# Patient Record
Sex: Female | Born: 1995 | Race: Black or African American | Hispanic: No | Marital: Single | Smoking: Former smoker
Health system: Southern US, Community
[De-identification: ages and names within clinical notes are randomized; demographics above are authoritative.]

## PROBLEM LIST (undated history)

## (undated) DIAGNOSIS — A749 Chlamydial infection, unspecified: Secondary | ICD-10-CM

## (undated) DIAGNOSIS — J45909 Unspecified asthma, uncomplicated: Secondary | ICD-10-CM

## (undated) DIAGNOSIS — D649 Anemia, unspecified: Secondary | ICD-10-CM

## (undated) DIAGNOSIS — B999 Unspecified infectious disease: Secondary | ICD-10-CM

## (undated) HISTORY — PX: TONSILLECTOMY: SUR1361

---
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
## 2013-05-19 ENCOUNTER — Emergency Department (HOSPITAL_COMMUNITY)
Admission: EM | Admit: 2013-05-19 | Discharge: 2013-05-20 | Disposition: A | Discharge status: Home / Self Care | Payer: Medicaid Other | Attending: Emergency Medicine | Admitting: Emergency Medicine

## 2013-05-19 ENCOUNTER — Encounter (HOSPITAL_COMMUNITY): Payer: Self-pay

## 2013-05-19 DIAGNOSIS — J45909 Unspecified asthma, uncomplicated: Secondary | ICD-10-CM | POA: Insufficient documentation

## 2013-05-19 DIAGNOSIS — H9209 Otalgia, unspecified ear: Secondary | ICD-10-CM | POA: Insufficient documentation

## 2013-05-19 DIAGNOSIS — K011 Impacted teeth: Secondary | ICD-10-CM

## 2013-05-19 DIAGNOSIS — K006 Disturbances in tooth eruption: Secondary | ICD-10-CM | POA: Insufficient documentation

## 2013-05-19 NOTE — ED Notes (Signed)
Dental pain that started about 1 week ago and is now causing facial pain and throat pain with also ear pain.  Pt is A&O with family at bedside

## 2013-05-19 NOTE — ED Provider Notes (Signed)
CSN: [id number]     Arrival date & time 05/19/13  2222 History  This chart was scribed for Earley Favor, NP working with Laray Anger, DO by Quintella Reichert, ED Scribe. This patient was seen in room TR08C/TR08C and the patient's care was started at 11:47 PM.   Chief Complaint  Patient presents with  . Dental Pain    The history is provided by the patient. No language interpreter was used.    HPI Comments: Stacy Warner is a 17 y.o. female who presents to the Emergency Department complaining of progressively-worsening moderate bilateral lower dental pain that began 2 weeks ago.  Pain radiates to bilateral ears and sides of her face.  Pt states her 2 lower wisdom teeth are coming in and she does not have a dentist.  She has attempted to treat pain with ibuprofen and Orajel, without relief.     Past Medical History  Diagnosis Date  . Asthma     Past Surgical History  Procedure Laterality Date  . Tonsillectomy      No family history on file.   History  Substance Use Topics  . Smoking status: Never Smoker   . Smokeless tobacco: Not on file  . Alcohol Use: No    OB History   Grav Para Term Preterm Abortions TAB SAB Ect Mult Living   0               Review of Systems  HENT: Positive for ear pain and dental problem.   Neurological: Negative for dizziness and headaches.  All other systems reviewed and are negative.     Allergies  Review of patient's allergies indicates no known allergies.  Home Medications   Current Outpatient Rx  Name  Route  Sig  Dispense  Refill  . naproxen sodium (ANAPROX) 220 MG tablet   Oral   Take 440 mg by mouth every 4 (four) hours as needed.         . traMADol (ULTRAM) 50 MG tablet   Oral   Take 1 tablet (50 mg total) by mouth every 6 (six) hours as needed for pain.   15 tablet   0    BP 113/80  Pulse 84  Temp(Src) 97.1 F (36.2 C)  Resp 18  SpO2 100%  LMP 05/17/2013  Physical Exam  Nursing note and vitals  reviewed. Constitutional: She appears well-developed and well-nourished. No distress.  HENT:  Head: Normocephalic and atraumatic.  Right Ear: External ear normal.  Left Ear: External ear normal.  Mouth/Throat: Oropharynx is clear and moist.  Partially erupted lower wisdom teeth  Eyes: Conjunctivae are normal.  Neck: Neck supple. No tracheal deviation present.  Cardiovascular: Normal rate.   Pulmonary/Chest: Effort normal. No respiratory distress.  Musculoskeletal: Normal range of motion.  Lymphadenopathy:    She has no cervical adenopathy.  Neurological: She is alert.  Skin: Skin is warm and dry.  Psychiatric: She has a normal mood and affect. Her behavior is normal.    ED Course  Procedures (including critical care time)  DIAGNOSTIC STUDIES: Oxygen Saturation is 100% on room air, normal by my interpretation.    COORDINATION OF CARE: 11:51 PM-Discussed treatment plan which includes pain medication and dental f/u with pt at bedside and pt agreed to plan.   Labs Review Labs Reviewed - No data to display  Imaging Review No results found.  MDM   1. Impacted third molar tooth   moderate gum swelling    I personally  performed the services described in this documentation, which was scribed in my presence. The recorded information has been reviewed and is accurate.     Arman Filter, NP 05/20/13 [phone]

## 2013-05-20 MED ORDER — TRAMADOL HCL 50 MG PO TABS
50.0000 mg | ORAL_TABLET | Freq: Once | ORAL | Status: AC
  Administered 2013-05-20: 50 mg via ORAL
  Filled 2013-05-20: qty 1

## 2013-05-20 MED ORDER — TRAMADOL HCL 50 MG PO TABS: 50.0000 mg | ORAL_TABLET | Freq: Four times a day (QID) | ORAL | Status: DC | PRN

## 2013-05-20 NOTE — ED Provider Notes (Signed)
Medical screening examination/treatment/procedure(s) were performed by non-physician practitioner and as supervising physician I was immediately available for consultation/collaboration.   Merryl Buckels M Advait Buice, DO 05/20/13 0836 

## 2014-08-10 ENCOUNTER — Encounter (HOSPITAL_COMMUNITY): Payer: Self-pay | Admitting: *Deleted

## 2014-08-10 ENCOUNTER — Inpatient Hospital Stay (HOSPITAL_COMMUNITY)
Admission: AD | Admit: 2014-08-10 | Discharge: 2014-08-10 | Disposition: A | Discharge status: Home / Self Care | Payer: Managed Care, Other (non HMO) | Source: Ambulatory Visit | Attending: Obstetrics and Gynecology | Admitting: Obstetrics and Gynecology

## 2014-08-10 DIAGNOSIS — N390 Urinary tract infection, site not specified: Secondary | ICD-10-CM | POA: Diagnosis not present

## 2014-08-10 DIAGNOSIS — F17200 Nicotine dependence, unspecified, uncomplicated: Secondary | ICD-10-CM | POA: Diagnosis not present

## 2014-08-10 DIAGNOSIS — N926 Irregular menstruation, unspecified: Secondary | ICD-10-CM | POA: Diagnosis not present

## 2014-08-10 HISTORY — DX: Anemia, unspecified: D64.9

## 2014-08-10 LAB — CBC
HCT: 35.8 % — ABNORMAL LOW (ref 36.0–46.0)
HEMOGLOBIN: 11.8 g/dL — AB (ref 12.0–15.0)
MCH: 28.4 pg (ref 26.0–34.0)
MCHC: 33 g/dL (ref 30.0–36.0)
MCV: 86.3 fL (ref 78.0–100.0)
PLATELETS: 237 10*3/uL (ref 150–400)
RBC: 4.15 MIL/uL (ref 3.87–5.11)
RDW: 15.6 % — ABNORMAL HIGH (ref 11.5–15.5)
WBC: 10.9 10*3/uL — ABNORMAL HIGH (ref 4.0–10.5)

## 2014-08-10 LAB — URINALYSIS, ROUTINE W REFLEX MICROSCOPIC
BILIRUBIN URINE: NEGATIVE
Glucose, UA: NEGATIVE mg/dL
Ketones, ur: NEGATIVE mg/dL
Leukocytes, UA: NEGATIVE
Nitrite: POSITIVE — AB
PH: 7 (ref 5.0–8.0)
Protein, ur: NEGATIVE mg/dL
SPECIFIC GRAVITY, URINE: 1.015 (ref 1.005–1.030)
Urobilinogen, UA: 0.2 mg/dL (ref 0.0–1.0)

## 2014-08-10 LAB — POCT PREGNANCY, URINE: PREG TEST UR: NEGATIVE

## 2014-08-10 LAB — WET PREP, GENITAL
TRICH WET PREP: NONE SEEN
YEAST WET PREP: NONE SEEN

## 2014-08-10 LAB — URINE MICROSCOPIC-ADD ON

## 2014-08-10 MED ORDER — MEDROXYPROGESTERONE ACETATE 150 MG/ML IM SUSP
150.0000 mg | Freq: Once | INTRAMUSCULAR | Status: AC
  Administered 2014-08-10: 150 mg via INTRAMUSCULAR
  Filled 2014-08-10: qty 1

## 2014-08-10 MED ORDER — CIPROFLOXACIN HCL 500 MG PO TABS: 500.0000 mg | ORAL_TABLET | Freq: Two times a day (BID) | ORAL | Status: DC

## 2014-08-10 NOTE — Discharge Instructions (Signed)
Urinary Tract Infection Urinary tract infections (UTIs) can develop anywhere along your urinary tract. Your urinary tract is your body's drainage system for removing wastes and extra water. Your urinary tract includes two kidneys, two ureters, a bladder, and a urethra. Your kidneys are a pair of bean-shaped organs. Each kidney is about the size of your fist. They are located below your ribs, one on each side of your spine. CAUSES Infections are caused by microbes, which are microscopic organisms, including fungi, viruses, and bacteria. These organisms are so small that they can only be seen through a microscope. Bacteria are the microbes that most commonly cause UTIs. SYMPTOMS  Symptoms of UTIs may vary by age and gender of the patient and by the location of the infection. Symptoms in young women typically include a frequent and intense urge to urinate and a painful, burning feeling in the bladder or urethra during urination. Older women and men are more likely to be tired, shaky, and weak and have muscle aches and abdominal pain. A fever may mean the infection is in your kidneys. Other symptoms of a kidney infection include pain in your back or sides below the ribs, nausea, and vomiting. DIAGNOSIS To diagnose a UTI, your caregiver will ask you about your symptoms. Your caregiver also will ask to provide a urine sample. The urine sample will be tested for bacteria and white blood cells. White blood cells are made by your body to help fight infection. TREATMENT  Typically, UTIs can be treated with medication. Because most UTIs are caused by a bacterial infection, they usually can be treated with the use of antibiotics. The choice of antibiotic and length of treatment depend on your symptoms and the type of bacteria causing your infection. HOME CARE INSTRUCTIONS  If you were prescribed antibiotics, take them exactly as your caregiver instructs you. Finish the medication even if you feel better after you  have only taken some of the medication.  Drink enough water and fluids to keep your urine clear or pale yellow.  Avoid caffeine, tea, and carbonated beverages. They tend to irritate your bladder.  Empty your bladder often. Avoid holding urine for long periods of time.  Empty your bladder before and after sexual intercourse.  After a bowel movement, women should cleanse from front to back. Use each tissue only once. SEEK MEDICAL CARE IF:   You have back pain.  You develop a fever.  Your symptoms do not begin to resolve within 3 days. SEEK IMMEDIATE MEDICAL CARE IF:   You have severe back pain or lower abdominal pain.  You develop chills.  You have nausea or vomiting.  You have continued burning or discomfort with urination. MAKE SURE YOU:   Understand these instructions.  Will watch your condition.  Will get help right away if you are not doing well or get worse. Document Released: 05/27/2005 Document Revised: 02/16/2012 Document Reviewed: 09/25/2011 [facility] Patient Information 2015 [place], [place]. This information is not intended to replace advice given to you by your health care provider. Make sure you discuss any questions you have with your health care provider. Medroxyprogesterone injection [Contraceptive] What is this medicine? MEDROXYPROGESTERONE (me DROX ee proe JES te rone) contraceptive injections prevent pregnancy. They provide effective birth control for 3 months. Depo-subQ Provera 104 is also used for treating pain related to endometriosis. This medicine may be used for other purposes; ask your health care provider or pharmacist if you have questions. COMMON BRAND NAME(S): Depo-Provera, Depo-subQ Provera 104 What should  I tell my health care provider before I take this medicine? They need to know if you have any of these conditions: -frequently drink alcohol -asthma -blood vessel disease or a history of a blood clot in the lungs or legs -bone disease such  as osteoporosis -breast cancer -diabetes -eating disorder (anorexia nervosa or bulimia) -high blood pressure -HIV infection or AIDS -kidney disease -liver disease -mental depression -migraine -seizures (convulsions) -stroke -tobacco smoker -vaginal bleeding -an unusual or allergic reaction to medroxyprogesterone, other hormones, medicines, foods, dyes, or preservatives -pregnant or trying to get pregnant -breast-feeding How should I use this medicine? Depo-Provera Contraceptive injection is given into a muscle. Depo-subQ Provera 104 injection is given under the skin. These injections are given by a health care professional. You must not be pregnant before getting an injection. The injection is usually given during the first 5 days after the start of a menstrual period or 6 weeks after delivery of a baby. Talk to your pediatrician regarding the use of this medicine in children. Special care may be needed. These injections have been used in female children who have started having menstrual periods. Overdosage: If you think you have taken too much of this medicine contact a poison control center or emergency room at once. NOTE: This medicine is only for you. Do not share this medicine with others. What if I miss a dose? Try not to miss a dose. You must get an injection once every 3 months to maintain birth control. If you cannot keep an appointment, call and reschedule it. If you wait longer than 13 weeks between Depo-Provera contraceptive injections or longer than 14 weeks between Depo-subQ Provera 104 injections, you could get pregnant. Use another method for birth control if you miss your appointment. You may also need a pregnancy test before receiving another injection. What may interact with this medicine? Do not take this medicine with any of the following medications: -bosentan This medicine may also interact with the following medications: -aminoglutethimide -antibiotics or  medicines for infections, especially rifampin, rifabutin, rifapentine, and griseofulvin -aprepitant -barbiturate medicines such as phenobarbital or primidone -bexarotene -carbamazepine -medicines for seizures like ethotoin, felbamate, oxcarbazepine, phenytoin, topiramate -modafinil -St. John's wort This list may not describe all possible interactions. Give your health care provider a list of all the medicines, herbs, non-prescription drugs, or dietary supplements you use. Also tell them if you smoke, drink alcohol, or use illegal drugs. Some items may interact with your medicine. What should I watch for while using this medicine? This drug does not protect you against HIV infection (AIDS) or other sexually transmitted diseases. Use of this product may cause you to lose calcium from your bones. Loss of calcium may cause weak bones (osteoporosis). Only use this product for more than 2 years if other forms of birth control are not right for you. The longer you use this product for birth control the more likely you will be at risk for weak bones. Ask your health care professional how you can keep strong bones. You may have a change in bleeding pattern or irregular periods. Many females stop having periods while taking this drug. If you have received your injections on time, your chance of being pregnant is very low. If you think you may be pregnant, see your health care professional as soon as possible. Tell your health care professional if you want to get pregnant within the next year. The effect of this medicine may last a long time after you get your last  injection. What side effects may I notice from receiving this medicine? Side effects that you should report to your doctor or health care professional as soon as possible: -allergic reactions like skin rash, itching or hives, swelling of the face, lips, or tongue -breast tenderness or discharge -breathing problems -changes in  vision -depression -feeling faint or lightheaded, falls -fever -pain in the abdomen, chest, groin, or leg -problems with balance, talking, walking -unusually weak or tired -yellowing of the eyes or skin Side effects that usually do not require medical attention (report to your doctor or health care professional if they continue or are bothersome): -acne -fluid retention and swelling -headache -irregular periods, spotting, or absent periods -temporary pain, itching, or skin reaction at site where injected -weight gain This list may not describe all possible side effects. Call your doctor for medical advice about side effects. You may report side effects to FDA at 1-800-FDA-1088. Where should I keep my medicine? This does not apply. The injection will be given to you by a health care professional. NOTE: This sheet is a summary. It may not cover all possible information. If you have questions about this medicine, talk to your doctor, pharmacist, or health care provider.  2015, [employer]. (2008-09-07 18:37:56)

## 2014-08-10 NOTE — MAU Note (Signed)
Pt stated for the last 3 months her periods have been last about 2 weeks. LMP started 07/26/14 and she is still bleeding andreprts still heavy and bright red with clots.reports cramping with it as well

## 2014-08-10 NOTE — MAU Provider Note (Signed)
History     CSN: [id number]  Arrival date and time: 08/10/14 5361   First Provider Initiated Contact with Patient 08/10/14 1949      Chief Complaint  Patient presents with  . Vaginal Bleeding   HPI  Stacy Warner is a 18 y.o. G0P0 who presents today with irregular menstrual cycles. She states that she had a period at the end of September until the first week of October. The again at the end of October until the first week in November. The her period started on 07/26/14 and she has been bleeding since. She states that is has been like a normal-heavy period since then. She has also had cramps. She is interested in birth control today. She states that she was on Depo-provera, and stopped that sometime earlier in the year, maybe May or June. She is unsure. However, she is interested in going back on depo-provera today.   She denies any family hx of bleeding/clotting disorders. She states that her grandfather has "something wrong with his thyroid". Patient does not have a PCP at this time.   Past Medical History  Diagnosis Date  . Asthma   . Anemia     Past Surgical History  Procedure Laterality Date  . Tonsillectomy      History reviewed. No pertinent family history.  History  Substance Use Topics  . Smoking status: Current Every Day Smoker  . Smokeless tobacco: Not on file  . Alcohol Use: No    Allergies: No Known Allergies  Prescriptions prior to admission  Medication Sig Dispense Refill Last Dose  . Acetaminophen-Caff-Pyrilamine (MENSTRUAL RELIEF MAX STRENGTH) 500-60-15 MG TABS Take 1 tablet by mouth 4 (four) times daily as needed (For cramps.).   08/10/2014 at Unknown time  . traMADol (ULTRAM) 50 MG tablet Take 1 tablet (50 mg total) by mouth every 6 (six) hours as needed for pain. (Patient not taking: Reported on 08/10/2014) 15 tablet 0 Not Taking at Unknown time    ROS Physical Exam   Blood pressure 128/77, pulse 62, temperature 98.4 F (36.9 C), temperature  source Oral, resp. rate 18, last menstrual period 07/26/2014.  Physical Exam  Nursing note and vitals reviewed. Constitutional: She is oriented to person, place, and time. She appears well-developed and well-nourished. No distress.  Cardiovascular: Normal rate.   Respiratory: Effort normal.  GI: Soft. There is no tenderness. There is no rebound.  Neurological: She is alert and oriented to person, place, and time.  Skin: Skin is warm and dry.  Psychiatric: She has a normal mood and affect.    MAU Course  Procedures Results for orders placed or performed during the hospital encounter of 08/10/14 (from the past 24 hour(s))  Urinalysis, Routine w reflex microscopic     Status: Abnormal   Collection Time: 08/10/14  7:18 PM  Result Value Ref Range   Color, Urine YELLOW YELLOW   APPearance CLEAR CLEAR   Specific Gravity, Urine 1.015 1.005 - 1.030   pH 7.0 5.0 - 8.0   Glucose, UA NEGATIVE NEGATIVE mg/dL   Hgb urine dipstick TRACE (A) NEGATIVE   Bilirubin Urine NEGATIVE NEGATIVE   Ketones, ur NEGATIVE NEGATIVE mg/dL   Protein, ur NEGATIVE NEGATIVE mg/dL   Urobilinogen, UA 0.2 0.0 - 1.0 mg/dL   Nitrite POSITIVE (A) NEGATIVE   Leukocytes, UA NEGATIVE NEGATIVE  Urine microscopic-add on     Status: Abnormal   Collection Time: 08/10/14  7:18 PM  Result Value Ref Range   Squamous Epithelial /  LPF FEW (A) RARE   WBC, UA 0-2 <3 WBC/hpf   RBC / HPF 0-2 <3 RBC/hpf   Bacteria, UA MANY (A) RARE  Pregnancy, urine POC     Status: None   Collection Time: 08/10/14  7:36 PM  Result Value Ref Range   Preg Test, Ur NEGATIVE NEGATIVE  Wet prep, genital     Status: Abnormal   Collection Time: 08/10/14  7:50 PM  Result Value Ref Range   Yeast Wet Prep HPF POC NONE SEEN NONE SEEN   Trich, Wet Prep NONE SEEN NONE SEEN   Clue Cells Wet Prep HPF POC FEW (A) NONE SEEN   WBC, Wet Prep HPF POC FEW (A) NONE SEEN  CBC     Status: Abnormal   Collection Time: 08/10/14  8:00 PM  Result Value Ref Range   WBC  10.9 (H) 4.0 - 10.5 K/uL   RBC 4.15 3.87 - 5.11 MIL/uL   Hemoglobin 11.8 (L) 12.0 - 15.0 g/dL   HCT 35.8 (L) 36.0 - 46.0 %   MCV 86.3 78.0 - 100.0 fL   MCH 28.4 26.0 - 34.0 pg   MCHC 33.0 30.0 - 36.0 g/dL   RDW 15.6 (H) 11.5 - 15.5 %   Platelets 237 150 - 400 K/uL      Assessment and Plan   1. Irregular menstrual cycle   2. UTI (lower urinary tract infection)    Depo-provera given today List of OBGYNs given, patient to schedule appointment  Return to MAU as needed RX: cipro 500mg  BID X 5 days    Medication List    TAKE these medications        ciprofloxacin 500 MG tablet  Commonly known as:  CIPRO  Take 1 tablet (500 mg total) by mouth 2 (two) times daily.     MENSTRUAL RELIEF MAX STRENGTH 500-60-15 MG Tabs  Generic drug:  Acetaminophen-Caff-Pyrilamine  Take 1 tablet by mouth 4 (four) times daily as needed (For cramps.).     traMADol 50 MG tablet  Commonly known as:  ULTRAM  Take 1 tablet (50 mg total) by mouth every 6 (six) hours as needed for pain.       Follow-up Information    Please follow up.   Contact information:   See the list of OBGYNs in [place].  Please schedule an appointment with any of your choice.        Mathis Bud 08/10/2014, 7:50 PM

## 2014-08-11 LAB — GC/CHLAMYDIA PROBE AMP
CT PROBE, AMP APTIMA: NEGATIVE
GC Probe RNA: NEGATIVE

## 2014-08-11 LAB — HIV ANTIBODY (ROUTINE TESTING W REFLEX): HIV: NONREACTIVE

## 2015-02-21 ENCOUNTER — Emergency Department (INDEPENDENT_AMBULATORY_CARE_PROVIDER_SITE_OTHER)
Admission: EM | Admit: 2015-02-21 | Discharge: 2015-02-21 | Disposition: A | Discharge status: Home / Self Care | Payer: BLUE CROSS/BLUE SHIELD | Source: Home / Self Care | Attending: Family Medicine | Admitting: Family Medicine

## 2015-02-21 ENCOUNTER — Encounter (HOSPITAL_COMMUNITY): Payer: Self-pay | Admitting: Emergency Medicine

## 2015-02-21 DIAGNOSIS — T148 Other injury of unspecified body region: Secondary | ICD-10-CM | POA: Diagnosis not present

## 2015-02-21 DIAGNOSIS — Z23 Encounter for immunization: Secondary | ICD-10-CM | POA: Diagnosis not present

## 2015-02-21 DIAGNOSIS — L0291 Cutaneous abscess, unspecified: Secondary | ICD-10-CM

## 2015-02-21 DIAGNOSIS — W503XXA Accidental bite by another person, initial encounter: Secondary | ICD-10-CM

## 2015-02-21 MED ORDER — DOXYCYCLINE HYCLATE 100 MG PO CAPS: 100.0000 mg | ORAL_CAPSULE | Freq: Two times a day (BID) | ORAL | Status: DC

## 2015-02-21 MED ORDER — FLUCONAZOLE 150 MG PO TABS: 150.0000 mg | ORAL_TABLET | Freq: Once | ORAL | Status: DC

## 2015-02-21 MED ORDER — AMOXICILLIN-POT CLAVULANATE 875-125 MG PO TABS: 1.0000 | ORAL_TABLET | Freq: Two times a day (BID) | ORAL | Status: DC

## 2015-02-21 MED ORDER — TETANUS-DIPHTH-ACELL PERTUSSIS 5-2.5-18.5 LF-MCG/0.5 IM SUSP
INTRAMUSCULAR | Status: AC
  Filled 2015-02-21: qty 0.5

## 2015-02-21 MED ORDER — TETANUS-DIPHTH-ACELL PERTUSSIS 5-2.5-18.5 LF-MCG/0.5 IM SUSP
0.5000 mL | Freq: Once | INTRAMUSCULAR | Status: AC
  Administered 2015-02-21: 0.5 mL via INTRAMUSCULAR

## 2015-02-21 NOTE — Discharge Instructions (Signed)
Thank you for coming in today. Take both Augmentin and doxycycline. Avoid becoming pregnant will taking these medications. Use fluconazole if you develop a yeast infection.   Abscess An abscess is an infected area that contains a collection of pus and debris.It can occur in almost any part of the body. An abscess is also known as a furuncle or boil. CAUSES  An abscess occurs when tissue gets infected. This can occur from blockage of oil or sweat glands, infection of hair follicles, or a minor injury to the skin. As the body tries to fight the infection, pus collects in the area and creates pressure under the skin. This pressure causes pain. People with weakened immune systems have difficulty fighting infections and get certain abscesses more often.  SYMPTOMS Usually an abscess develops on the skin and becomes a painful mass that is red, warm, and tender. If the abscess forms under the skin, you may feel a moveable soft area under the skin. Some abscesses break open (rupture) on their own, but most will continue to get worse without care. The infection can spread deeper into the body and eventually into the bloodstream, causing you to feel ill.  DIAGNOSIS  Your caregiver will take your medical history and perform a physical exam. A sample of fluid may also be taken from the abscess to determine what is causing your infection. TREATMENT  Your caregiver may prescribe antibiotic medicines to fight the infection. However, taking antibiotics alone usually does not cure an abscess. Your caregiver may need to make a small cut (incision) in the abscess to drain the pus. In some cases, gauze is packed into the abscess to reduce pain and to continue draining the area. HOME CARE INSTRUCTIONS   Only take over-the-counter or prescription medicines for pain, discomfort, or fever as directed by your caregiver.  If you were prescribed antibiotics, take them as directed. Finish them even if you start to feel  better.  If gauze is used, follow your caregiver's directions for changing the gauze.  To avoid spreading the infection:  Keep your draining abscess covered with a bandage.  Wash your hands well.  Do not share personal care items, towels, or whirlpools with others.  Avoid skin contact with others.  Keep your skin and clothes clean around the abscess.  Keep all follow-up appointments as directed by your caregiver. SEEK MEDICAL CARE IF:   You have increased pain, swelling, redness, fluid drainage, or bleeding.  You have muscle aches, chills, or a general ill feeling.  You have a fever. MAKE SURE YOU:   Understand these instructions.  Will watch your condition.  Will get help right away if you are not doing well or get worse. Document Released: 05/27/2005 Document Revised: 02/16/2012 Document Reviewed: 10/30/2011 [facility] Patient Information 2015 [place], [place]. This information is not intended to replace advice given to you by your health care provider. Make sure you discuss any questions you have with your health care provider.

## 2015-02-21 NOTE — ED Notes (Signed)
Call back number verified.

## 2015-02-21 NOTE — ED Notes (Signed)
Pt reports multiple abscess to left lower extremity onset 3 days Getting bigger and more painful w/some drainage Denies fevers, chills Alert, no signs of acute distress.
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
## 2015-02-24 LAB — CULTURE, ROUTINE-ABSCESS: Special Requests: NORMAL

## 2015-02-25 ENCOUNTER — Telehealth (HOSPITAL_COMMUNITY): Payer: Self-pay | Admitting: Family Medicine

## 2015-02-25 NOTE — ED Notes (Signed)
Called patient about her abscess culture report. I left a message asking for call back.,   Gregor Hams, MD 02/25/15 1221

## 2015-02-25 NOTE — ED Notes (Signed)
Called patient regarding her lab results. She is feeling much better. Discussed tests.   Gregor Hams, MD 02/25/15 1520

## 2015-02-25 NOTE — ED Notes (Signed)
Culture final report final for MRSA. Dr Georgina Snell has a call in to patient to discuss

## 2015-09-10 ENCOUNTER — Encounter (HOSPITAL_COMMUNITY): Payer: Self-pay | Admitting: *Deleted

## 2015-09-10 ENCOUNTER — Inpatient Hospital Stay (HOSPITAL_COMMUNITY)
Admission: AD | Admit: 2015-09-10 | Discharge: 2015-09-10 | Disposition: A | Discharge status: Home / Self Care | Payer: Self-pay | Source: Ambulatory Visit | Attending: Obstetrics & Gynecology | Admitting: Obstetrics & Gynecology

## 2015-09-10 DIAGNOSIS — N898 Other specified noninflammatory disorders of vagina: Secondary | ICD-10-CM

## 2015-09-10 DIAGNOSIS — Z202 Contact with and (suspected) exposure to infections with a predominantly sexual mode of transmission: Secondary | ICD-10-CM

## 2015-09-10 DIAGNOSIS — J45909 Unspecified asthma, uncomplicated: Secondary | ICD-10-CM | POA: Insufficient documentation

## 2015-09-10 DIAGNOSIS — Z87440 Personal history of urinary (tract) infections: Secondary | ICD-10-CM | POA: Insufficient documentation

## 2015-09-10 DIAGNOSIS — D649 Anemia, unspecified: Secondary | ICD-10-CM | POA: Insufficient documentation

## 2015-09-10 DIAGNOSIS — F17200 Nicotine dependence, unspecified, uncomplicated: Secondary | ICD-10-CM | POA: Insufficient documentation

## 2015-09-10 HISTORY — DX: Chlamydial infection, unspecified: A74.9

## 2015-09-10 HISTORY — DX: Unspecified infectious disease: B99.9

## 2015-09-10 LAB — URINE MICROSCOPIC-ADD ON

## 2015-09-10 LAB — URINALYSIS, ROUTINE W REFLEX MICROSCOPIC
Bilirubin Urine: NEGATIVE
GLUCOSE, UA: NEGATIVE mg/dL
KETONES UR: NEGATIVE mg/dL
Nitrite: NEGATIVE
PH: 6.5 (ref 5.0–8.0)
Protein, ur: NEGATIVE mg/dL
Specific Gravity, Urine: 1.02 (ref 1.005–1.030)

## 2015-09-10 LAB — WET PREP, GENITAL
Clue Cells Wet Prep HPF POC: NONE SEEN
Sperm: NONE SEEN
Trich, Wet Prep: NONE SEEN
YEAST WET PREP: NONE SEEN

## 2015-09-10 LAB — POCT PREGNANCY, URINE: Preg Test, Ur: NEGATIVE

## 2015-09-10 MED ORDER — CEFTRIAXONE SODIUM 250 MG IJ SOLR
250.0000 mg | Freq: Once | INTRAMUSCULAR | Status: AC
  Administered 2015-09-10: 250 mg via INTRAMUSCULAR
  Filled 2015-09-10: qty 250

## 2015-09-10 MED ORDER — AZITHROMYCIN 250 MG PO TABS
1000.0000 mg | ORAL_TABLET | Freq: Once | ORAL | Status: AC
  Administered 2015-09-10: 1000 mg via ORAL
  Filled 2015-09-10: qty 4

## 2015-09-10 NOTE — Progress Notes (Signed)
Written and verbal d/c instructions given and understanding voiced. Fatima Blank CNM in earlier to discuss test results.

## 2015-09-10 NOTE — Discharge Instructions (Signed)
Sexually Transmitted Disease °A sexually transmitted disease (STD) is a disease or infection that may be passed (transmitted) from person to person, usually during sexual activity. This may happen by way of saliva, semen, blood, vaginal mucus, or urine. Common STDs include: °· Gonorrhea. °· Chlamydia. °· Syphilis. °· HIV and AIDS. °· Genital herpes. °· Hepatitis B and C. °· Trichomonas. °· Human papillomavirus (HPV). °· Pubic lice. °· Scabies. °· Mites. °· Bacterial vaginosis. °WHAT ARE CAUSES OF STDs? °An STD may be caused by bacteria, a virus, or parasites. STDs are often transmitted during sexual activity if one person is infected. However, they may also be transmitted through nonsexual means. STDs may be transmitted after:  °· Sexual intercourse with an infected person. °· Sharing sex toys with an infected person. °· Sharing needles with an infected person or using unclean piercing or tattoo needles. °· Having intimate contact with the genitals, mouth, or rectal areas of an infected person. °· Exposure to infected fluids during birth. °WHAT ARE THE SIGNS AND SYMPTOMS OF STDs? °Different STDs have different symptoms. Some people may not have any symptoms. If symptoms are present, they may include: °· Painful or bloody urination. °· Pain in the pelvis, abdomen, vagina, anus, throat, or eyes. °· A skin rash, itching, or irritation. °· Growths, ulcerations, blisters, or sores in the genital and anal areas. °· Abnormal vaginal discharge with or without bad odor. °· Penile discharge in men. °· Fever. °· Pain or bleeding during sexual intercourse. °· Swollen glands in the groin area. °· Yellow skin and eyes (jaundice). This is seen with hepatitis. °· Swollen testicles. °· Infertility. °· Sores and blisters in the mouth. °HOW ARE STDs DIAGNOSED? °To make a diagnosis, your health care provider may: °· Take a medical history. °· Perform a physical exam. °· Take a sample of any discharge to examine. °· Swab the throat,  cervix, opening to the penis, rectum, or vagina for testing. °· Test a sample of your first morning urine. °· Perform blood tests. °· Perform a Pap test, if this applies. °· Perform a colposcopy. °· Perform a laparoscopy. °HOW ARE STDs TREATED? °Treatment depends on the STD. Some STDs may be treated but not cured. °· Chlamydia, gonorrhea, trichomonas, and syphilis can be cured with antibiotic medicine. °· Genital herpes, hepatitis, and HIV can be treated, but not cured, with prescribed medicines. The medicines lessen symptoms. °· Genital warts from HPV can be treated with medicine or by freezing, burning (electrocautery), or surgery. Warts may come back. °· HPV cannot be cured with medicine or surgery. However, abnormal areas may be removed from the cervix, vagina, or vulva. °· If your diagnosis is confirmed, your recent sexual partners need treatment. This is true even if they are symptom-free or have a negative culture or evaluation. They should not have sex until their health care providers say it is okay. °· Your health care provider may test you for infection again 3 months after treatment. °HOW CAN I REDUCE MY RISK OF GETTING AN STD? °Take these steps to reduce your risk of getting an STD: °· Use latex condoms, dental dams, and water-soluble lubricants during sexual activity. Do not use petroleum jelly or oils. °· Avoid having multiple sex partners. °· Do not have sex with someone who has other sex partners °· Do not have sex with anyone you do not know or who is at high risk for an STD. °· Avoid risky sex practices that can break your skin. °· Do not have sex   if you have open sores on your mouth or skin. °· Avoid drinking too much alcohol or taking illegal drugs. Alcohol and drugs can affect your judgment and put you in a vulnerable position. °· Avoid engaging in oral and anal sex acts. °· Get vaccinated for HPV and hepatitis. If you have not received these vaccines in the past, talk to your health care  provider about whether one or both might be right for you. °· If you are at risk of being infected with HIV, it is recommended that you take a prescription medicine daily to prevent HIV infection. This is called pre-exposure prophylaxis (PrEP). You are considered at risk if: °¨ You are a man who has sex with other men (MSM). °¨ You are a heterosexual man or woman and are sexually active with more than one partner. °¨ You take drugs by injection. °¨ You are sexually active with a partner who has HIV. °· Talk with your health care provider about whether you are at high risk of being infected with HIV. If you choose to begin PrEP, you should first be tested for HIV. You should then be tested every 3 months for as long as you are taking PrEP. °WHAT SHOULD I DO IF I THINK I HAVE AN STD? °· See your health care provider. °· Tell your sexual partner(s). They should be tested and treated for any STDs. °· Do not have sex until your health care provider says it is okay. °WHEN SHOULD I GET IMMEDIATE MEDICAL CARE? °Contact your health care provider right away if:  °· You have severe abdominal pain. °· You are a man and notice swelling or pain in your testicles. °· You are a woman and notice swelling or pain in your vagina. °  °This information is not intended to replace advice given to you by your health care provider. Make sure you discuss any questions you have with your health care provider. °  °Document Released: 11/07/2002 Document Revised: 09/07/2014 Document Reviewed: 03/07/2013 °Elsevier Interactive Patient Education ©2016 [employer] ° °

## 2015-09-10 NOTE — MAU Note (Signed)
Vagina is really dry and irritated. Is having frequency of urination, but no pain  Spouse told her last night that he is having issues when he pees.

## 2015-09-10 NOTE — MAU Provider Note (Signed)
Chief Complaint: vaginal dryness and irritation    First Provider Initiated Contact with Patient 09/10/15 1111      SUBJECTIVE HPI: Stacy Warner is a 20 y.o. G0P0 at Unknown by LMP who presents to maternity admissions reporting vaginal dryness and irritation x3-4 days worse today because she had intercourse yesterday.She has not tried anything to improve her symptoms. Also, her partner had pain with urination yesterday and went to ED and was given abx for possible STD.  She and her partner were separated and recently reconciled.  She is worried she could have an STD.  She denies abdominal pain, vaginal bleeding, vaginal discharge or itching, urinary symptoms, h/a, dizziness, n/v, or fever/chills.     HPI  Past Medical History  Diagnosis Date  . Asthma   . Anemia   . Infection     UTI  . Chlamydia     age 88   Past Surgical History  Procedure Laterality Date  . Tonsillectomy     Social History   Social History  . Marital Status: Single    Spouse Name: N/A  . Number of Children: N/A  . Years of Education: N/A   Occupational History  . Not on file.   Social History Main Topics  . Smoking status: Current Every Day Smoker -- 1 years    Types: Cigars  . Smokeless tobacco: Never Used     Comment: black and mild  . Alcohol Use: No  . Drug Use: No  . Sexual Activity: Yes    Birth Control/ Protection: None, Condom   Other Topics Concern  . Not on file   Social History Narrative   No current facility-administered medications on file prior to encounter.   Current Outpatient Prescriptions on File Prior to Encounter  Medication Sig Dispense Refill  . amoxicillin-clavulanate (AUGMENTIN) 875-125 MG per tablet Take 1 tablet by mouth every 12 (twelve) hours. (Patient not taking: Reported on 09/10/2015) 14 tablet 0  . doxycycline (VIBRAMYCIN) 100 MG capsule Take 1 capsule (100 mg total) by mouth 2 (two) times daily. (Patient not taking: Reported on 09/10/2015) 14 capsule 0  .  fluconazole (DIFLUCAN) 150 MG tablet Take 1 tablet (150 mg total) by mouth once. (Patient not taking: Reported on 09/10/2015) 1 tablet 1   No Known Allergies  ROS:  Review of Systems  Constitutional: Negative for fever, chills and fatigue.  Respiratory: Negative for shortness of breath.   Cardiovascular: Negative for chest pain.  Genitourinary: Positive for vaginal pain. Negative for dysuria, flank pain, vaginal bleeding, vaginal discharge, difficulty urinating and pelvic pain.  Neurological: Negative for dizziness and headaches.  Psychiatric/Behavioral: Negative.      I have reviewed patient's Past Medical Hx, Surgical Hx, Family Hx, Social Hx, medications and allergies.   Physical Exam   Patient Vitals for the past 24 hrs:  BP Temp Temp src Pulse Resp Height Weight  09/10/15 1203 122/79 mmHg - - 70 18 - -  09/10/15 1041 126/82 mmHg 98.2 F (36.8 C) Oral 81 16 - -  09/10/15 1038 - - - - - 5\' 5"  (1.651 m) 108 lb 3.2 oz (49.079 kg)   Constitutional: Well-developed, well-nourished female in no acute distress.  Cardiovascular: normal rate Respiratory: normal effort GI: Abd soft, non-tender. Pos BS x 4 MS: Extremities nontender, no edema, normal ROM Neurologic: Alert and oriented x 4.  GU: Neg CVAT.  PELVIC EXAM: Cervix pink, visually closed, without lesion, scant white creamy discharge, vaginal walls and external genitalia normal  Bimanual exam: Cervix 0/long/high, firm, anterior, neg CMT, uterus nontender, nonenlarged, adnexa without tenderness, enlargement, or mass   LAB RESULTS Results for orders placed or performed during the hospital encounter of 09/10/15 (from the past 24 hour(s))  Urinalysis, Routine w reflex microscopic (not at [facility])     Status: Abnormal   Collection Time: 09/10/15 10:40 AM  Result Value Ref Range   Color, Urine YELLOW YELLOW   APPearance HAZY (A) CLEAR   Specific Gravity, Urine 1.020 1.005 - 1.030   pH 6.5 5.0 - 8.0   Glucose, UA NEGATIVE NEGATIVE  mg/dL   Hgb urine dipstick MODERATE (A) NEGATIVE   Bilirubin Urine NEGATIVE NEGATIVE   Ketones, ur NEGATIVE NEGATIVE mg/dL   Protein, ur NEGATIVE NEGATIVE mg/dL   Nitrite NEGATIVE NEGATIVE   Leukocytes, UA SMALL (A) NEGATIVE  Urine microscopic-add on     Status: Abnormal   Collection Time: 09/10/15 10:40 AM  Result Value Ref Range   Squamous Epithelial / LPF 0-5 (A) NONE SEEN   WBC, UA 0-5 0 - 5 WBC/hpf   RBC / HPF 0-5 0 - 5 RBC/hpf   Bacteria, UA FEW (A) NONE SEEN   Urine-Other MUCOUS PRESENT   Pregnancy, urine POC     Status: None   Collection Time: 09/10/15 10:42 AM  Result Value Ref Range   Preg Test, Ur NEGATIVE NEGATIVE  Wet prep, genital     Status: Abnormal   Collection Time: 09/10/15 11:22 AM  Result Value Ref Range   Yeast Wet Prep HPF POC NONE SEEN NONE SEEN   Trich, Wet Prep NONE SEEN NONE SEEN   Clue Cells Wet Prep HPF POC NONE SEEN NONE SEEN   WBC, Wet Prep HPF POC FEW (A) NONE SEEN   Sperm NONE SEEN        IMAGING No results found.  MAU Management/MDM: Ordered labs and reviewed results.  STD testing pending.  Since partner symptomatic and received treatment in ED, will treat for exposure today with Rocephin 250 mg IM and azithromycin 1000 mg PO.  Pt tolerated well. Pt stable at time of discharge.  ASSESSMENT 1. Possible exposure to STD   2. Vaginal irritation     PLAN Discharge home [place], [facility], HIV pending  Follow-up Information    Please follow up.   Why:  As needed for emergencies, Call [phone] for lab results.      Follow up with [facility].   Why:  As needed for emergencies   Contact information:   [id number] [place] Certified Nurse-Midwife 09/10/2015  12:25 PM

## 2015-09-11 LAB — GC/CHLAMYDIA PROBE AMP (~~LOC~~) NOT AT ARMC
Chlamydia: POSITIVE — AB
NEISSERIA GONORRHEA: POSITIVE — AB

## 2015-09-11 LAB — HIV ANTIBODY (ROUTINE TESTING W REFLEX): HIV Screen 4th Generation wRfx: NONREACTIVE

## 2015-09-11 LAB — RPR: RPR: NONREACTIVE

## 2015-09-12 LAB — URINE CULTURE: Culture: >=100000

## 2016-01-18 ENCOUNTER — Encounter (HOSPITAL_COMMUNITY): Payer: Self-pay | Admitting: Emergency Medicine

## 2016-01-18 ENCOUNTER — Emergency Department (HOSPITAL_COMMUNITY)
Admission: EM | Admit: 2016-01-18 | Discharge: 2016-01-18 | Disposition: A | Discharge status: Home / Self Care | Payer: BLUE CROSS/BLUE SHIELD | Attending: Emergency Medicine | Admitting: Emergency Medicine

## 2016-01-18 DIAGNOSIS — F17210 Nicotine dependence, cigarettes, uncomplicated: Secondary | ICD-10-CM | POA: Insufficient documentation

## 2016-01-18 DIAGNOSIS — G4489 Other headache syndrome: Secondary | ICD-10-CM | POA: Insufficient documentation

## 2016-01-18 DIAGNOSIS — J45909 Unspecified asthma, uncomplicated: Secondary | ICD-10-CM | POA: Insufficient documentation

## 2016-01-18 LAB — POC URINE PREG, ED: PREG TEST UR: NEGATIVE

## 2016-01-18 MED ORDER — ONDANSETRON 8 MG PO TBDP: ORAL_TABLET | ORAL | Status: DC

## 2016-01-18 NOTE — ED Notes (Signed)
Pt. reports headache onset 2 months ago worse this week , denies head injury , no nausea or photophobia .

## 2016-01-18 NOTE — ED Provider Notes (Signed)
CSN: [id number]     Arrival date & time 01/18/16  0446 History   First MD Initiated Contact with Patient 01/18/16 0543     Chief Complaint  Patient presents with  . Headache     Patient is a 20 y.o. female presenting with headaches. The history is provided by the patient.  Headache Pain location:  Generalized Onset quality:  Gradual Duration: 2 months. Timing:  Intermittent Progression:  Worsening Chronicity:  New Similar to prior headaches: yes   Relieved by:  NSAIDs Worsened by:  Nothing Associated symptoms: nausea   Associated symptoms: no fever, no focal weakness and no weakness   Patient with h/o asthma presents for intermittent HA for past 2 months. She reports she has about 2 headaches/week for past 2 months It seemed to be worsening this week No fever No visual changes No weakness No rash No tick bites No falls No hearing changes No h/o head trauma She is now painfree   Past Medical History  Diagnosis Date  . Asthma   . Anemia   . Infection     UTI  . Chlamydia     age 64   Past Surgical History  Procedure Laterality Date  . Tonsillectomy     Family History  Problem Relation Age of Onset  . Heart disease Paternal Grandmother   . Stroke Paternal Grandmother    Social History  Substance Use Topics  . Smoking status: Current Every Day Smoker -- 1 years    Types: Cigars  . Smokeless tobacco: Never Used     Comment: black and mild  . Alcohol Use: No   OB History    Gravida Para Term Preterm AB TAB SAB Ectopic Multiple Living   0              Review of Systems  Constitutional: Negative for fever.  Gastrointestinal: Positive for nausea.  Skin: Negative for rash.  Neurological: Positive for headaches. Negative for focal weakness and weakness.  All other systems reviewed and are negative.     Allergies  Review of patient's allergies indicates no known allergies.  Home Medications   Prior to Admission medications   Not on File   BP  109/78 mmHg  Pulse 70  Temp(Src) 97.8 F (36.6 C) (Oral)  Resp 16  Ht 5\' 4"  (1.626 m)  Wt 48.081 kg  BMI 18.19 kg/m2  SpO2 100%  LMP 12/15/2015 (Approximate) Physical Exam CONSTITUTIONAL: Well developed/well nourished HEAD: Normocephalic/atraumatic EYES: EOMI/PERRL, no nystagmus, no ptosis ENMT: Mucous membranes moist NECK: supple no meningeal signs, no bruits SPINE/BACK:entire spine nontender CV: S1/S2 noted, no murmurs/rubs/gallops noted LUNGS: Lungs are clear to auscultation bilaterally, no apparent distress ABDOMEN: soft, nontender, no rebound or guarding GU:no cva tenderness NEURO:Awake/alert, face symmetric, no arm or leg drift is noted Equal 5/5 strength with shoulder abduction, elbow flex/extension, wrist flex/extension in upper extremities and equal hand grips bilaterally Equal 5/5 strength with hip flexion,knee flex/extension, foot dorsi/plantar flexion Cranial nerves 3/4/5/6/03/08/09/11/12 tested and intact Gait normal without ataxia No past pointing Sensation to light touch intact in all extremities EXTREMITIES: pulses normal, full ROM SKIN: warm, color normal PSYCH: no abnormalities of mood noted, alert and oriented to situation   ED Course  Procedures  Labs Review Labs Reviewed  POC URINE PREG, ED   I have personally reviewed and evaluated these  lab results as part of my medical decision-making.  6:35 AM Pt is now HA free at this time   She is well  appearing Ambulatory No distress I doubt acute neurologic emergency Safe for d/c Given info on f/u with [facility] wellness center Discussed return precautions   MDM   Final diagnoses:  Other headache syndrome    Nursing notes including past medical history and social history reviewed and considered in documentation     Ripley Fraise, MD 01/18/16 [phone]

## 2016-01-18 NOTE — ED Notes (Signed)
Pt prefers to sit in chair with family member, requests to be off monitor at this time.

## 2016-01-18 NOTE — Discharge Instructions (Signed)

## 2016-01-31 ENCOUNTER — Emergency Department (HOSPITAL_COMMUNITY)
Admission: EM | Admit: 2016-01-31 | Discharge: 2016-01-31 | Disposition: A | Discharge status: Home / Self Care | Payer: BLUE CROSS/BLUE SHIELD | Attending: Emergency Medicine | Admitting: Emergency Medicine

## 2016-01-31 ENCOUNTER — Emergency Department (HOSPITAL_COMMUNITY): Payer: BLUE CROSS/BLUE SHIELD

## 2016-01-31 ENCOUNTER — Encounter (HOSPITAL_COMMUNITY): Payer: Self-pay | Admitting: Emergency Medicine

## 2016-01-31 DIAGNOSIS — R51 Headache: Secondary | ICD-10-CM | POA: Insufficient documentation

## 2016-01-31 DIAGNOSIS — G8929 Other chronic pain: Secondary | ICD-10-CM | POA: Insufficient documentation

## 2016-01-31 DIAGNOSIS — J45909 Unspecified asthma, uncomplicated: Secondary | ICD-10-CM | POA: Insufficient documentation

## 2016-01-31 DIAGNOSIS — F17210 Nicotine dependence, cigarettes, uncomplicated: Secondary | ICD-10-CM | POA: Insufficient documentation

## 2016-01-31 DIAGNOSIS — Z79899 Other long term (current) drug therapy: Secondary | ICD-10-CM | POA: Insufficient documentation

## 2016-01-31 DIAGNOSIS — R519 Headache, unspecified: Secondary | ICD-10-CM

## 2016-01-31 LAB — I-STAT CHEM 8, ED
BUN: 8 mg/dL (ref 6–20)
CALCIUM ION: 1.2 mmol/L (ref 1.12–1.23)
CREATININE: 1 mg/dL (ref 0.44–1.00)
Chloride: 107 mmol/L (ref 101–111)
GLUCOSE: 76 mg/dL (ref 65–99)
HCT: 38 % (ref 36.0–46.0)
HEMOGLOBIN: 12.9 g/dL (ref 12.0–15.0)
Potassium: 3.7 mmol/L (ref 3.5–5.1)
Sodium: 140 mmol/L (ref 135–145)
TCO2: 25 mmol/L (ref 0–100)

## 2016-01-31 MED ORDER — METOCLOPRAMIDE HCL 5 MG/ML IJ SOLN
10.0000 mg | Freq: Once | INTRAMUSCULAR | Status: AC
Start: 2016-01-31 — End: 2016-01-31
  Administered 2016-01-31: 10 mg via INTRAVENOUS
  Filled 2016-01-31: qty 2

## 2016-01-31 MED ORDER — DIPHENHYDRAMINE HCL 50 MG/ML IJ SOLN
25.0000 mg | Freq: Once | INTRAMUSCULAR | Status: AC
  Administered 2016-01-31: 25 mg via INTRAVENOUS
  Filled 2016-01-31: qty 1

## 2016-01-31 MED ORDER — SODIUM CHLORIDE 0.9 % IV BOLUS (SEPSIS)
1000.0000 mL | Freq: Once | INTRAVENOUS | Status: AC
  Administered 2016-01-31: 1000 mL via INTRAVENOUS

## 2016-01-31 MED ORDER — BUTALBITAL-APAP-CAFFEINE 50-325-40 MG PO TABS: 1.0000 | ORAL_TABLET | Freq: Four times a day (QID) | ORAL | Status: DC | PRN

## 2016-01-31 NOTE — ED Provider Notes (Signed)
CSN: [id number]     Arrival date & time 01/31/16  1500 History  By signing my name below, I, Rayna Sexton, attest that this documentation has been prepared under the direction and in the presence of Domenic Moras, PA-C. Electronically Signed: Rayna Sexton, ED Scribe. 01/31/2016. 4:27 PM.   Chief Complaint  Patient presents with  . Headache   The history is provided by the patient. No language interpreter was used.    HPI Comments: Stacy Warner is a 20 y.o. female who presents to the Emergency Department complaining of intermittent, 7/10, throbbing/sore, left sided HA x 2 months. She states that her HA presents as a pressure in her left parietal region and gradually worsens to her left face and head. Pt states that her HA presents daily. Pt reports associated, mild, intermittent, lightheadedness, intermittent nocturnal hyperhidrosis and difficulty sleeping due to pain. She has taken [facility] Powders without relief. Pt denies a hx of HA prior to the onset of her symptoms 2 months ago. She states she is sexually active and is unsure if she is pregnant. She reports a FMHx of stroke. Pt denies visual changes, n/v, head trauma and numbness.     Past Medical History  Diagnosis Date  . Asthma   . Anemia   . Infection     UTI  . Chlamydia     age 54   Past Surgical History  Procedure Laterality Date  . Tonsillectomy     Family History  Problem Relation Age of Onset  . Heart disease Paternal Grandmother   . Stroke Paternal Grandmother    Social History  Substance Use Topics  . Smoking status: Current Every Day Smoker -- 0.25 packs/day for 1 years    Types: Cigars  . Smokeless tobacco: Never Used     Comment: black and mild  . Alcohol Use: Yes     Comment: occasional   OB History    Gravida Para Term Preterm AB TAB SAB Ectopic Multiple Living   0              Review of Systems  Constitutional: Positive for diaphoresis.  Eyes: Negative for visual disturbance.  Gastrointestinal:  Negative for nausea and vomiting.  Neurological: Positive for light-headedness and headaches. Negative for numbness.  Psychiatric/Behavioral: Positive for sleep disturbance.    Allergies  Review of patient's allergies indicates no known allergies.  Home Medications   Prior to Admission medications   Medication Sig Start Date End Date Taking? Authorizing Provider  ondansetron (ZOFRAN ODT) 8 MG disintegrating tablet 8mg  ODT q4 hours prn nausea 01/18/16   Ripley Fraise, MD   BP 119/83 mmHg  Pulse 90  Temp(Src) 98.1 F (36.7 C) (Oral)  Resp 18  Ht 5\' 5"  (1.651 m)  Wt 106 lb (48.081 kg)  BMI 17.64 kg/m2  SpO2 100%  LMP 01/20/2016 (Approximate)    Physical Exam  Constitutional: She is oriented to person, place, and time. She appears well-developed and well-nourished.  HENT:  Head: Normocephalic and atraumatic.  Skin sensitivity to left side of the face without signs of infection   Eyes: EOM are normal. Pupils are equal, round, and reactive to light.  Neck: Normal range of motion. Neck supple.  No nuchal rigidity   Cardiovascular: Normal rate.   Pulmonary/Chest: Effort normal. No respiratory distress.  Abdominal: Soft.  Musculoskeletal: Normal range of motion.  Neurological: She is alert and oriented to person, place, and time.  Cranial nerves II-VII grossly intact  Skin: Skin is  warm and dry.  Psychiatric: She has a normal mood and affect.  Nursing note and vitals reviewed.   ED Course  Procedures  DIAGNOSTIC STUDIES: Oxygen Saturation is 100% on RA, normal by my interpretation.    COORDINATION OF CARE: 4:23 PM Pt presents with an intermittent left sided HA for the past 2 months. Discussed next steps with pt including migraine cocktail, CT head and reevaluation based on imaging results. Pt verbalized understanding and is agreeable with the plan.   7:10 PM Head ct unremarkable.  Pt felt better after migraine cocktail.  Recommend f/u with pcp or neurology for further  care.    Labs Review Labs Reviewed  I-STAT CHEM 8, ED  POC URINE PREG, ED    Imaging Review Ct Head Wo Contrast  01/31/2016  CLINICAL DATA:  Pt reports ongoing HA X 2 months. Current HA started yesterday. EXAM: CT HEAD WITHOUT CONTRAST TECHNIQUE: Contiguous axial images were obtained from the base of the skull through the vertex without intravenous contrast. COMPARISON:  11/19/2010 FINDINGS: No acute intracranial abnormality. Specifically, no hemorrhage, hydrocephalus, mass lesion, acute infarction, or significant intracranial injury. No acute calvarial abnormality. Visualized paranasal sinuses and mastoids clear. Orbital soft tissues unremarkable. IMPRESSION: No intracranial abnormality. Electronically Signed   By: Rolm Baptise M.D.   On: 01/31/2016 18:30   I have personally reviewed and evaluated these images as part of my medical decision-making.   EKG Interpretation None      MDM   Final diagnoses:  Chronic daily headache   BP 119/83 mmHg  Pulse 90  Temp(Src) 98.1 F (36.7 C) (Oral)  Resp 18  Ht 5\' 5"  (1.651 m)  Wt 48.081 kg  BMI 17.64 kg/m2  SpO2 100%  LMP 01/20/2016 (Approximate)  I personally performed the services described in this documentation, which was scribed in my presence. The recorded information has been reviewed and is accurate.       Domenic Moras, PA-C 01/31/16 1911  Daleen Bo, MD 02/01/16 0900

## 2016-01-31 NOTE — ED Notes (Signed)
Pt here for HA since last night. Pt reports frequent HA over the past 2 months, and over the past week she has had a HA every day. Neuro intact.

## 2016-01-31 NOTE — ED Notes (Signed)
Called minilab regarding urine pregnancy that has not resulted. Roxanne advised that the result was negative, but the machine did not transfer the data into the system. Gertie Fey, PA advised of result.

## 2016-01-31 NOTE — ED Notes (Signed)
States that left side of head has been hurting on and off for the  Last 2 months, worse over the last week, denies injury, n/v or blurred vision, states she occ gets dizzy she does grind her teeth at night. Has been taking BC powders but it has not helped no hx of migrains

## 2016-01-31 NOTE — Discharge Instructions (Signed)
Please find a primary care provider or a neurologist for further evaluation and management of your recurrent headache  General Headache Without Cause A headache is pain or discomfort felt around the head or neck area. The specific cause of a headache may not be found. There are many causes and types of headaches. A few common ones are:  Tension headaches.  Migraine headaches.  Cluster headaches.  Chronic daily headaches. HOME CARE INSTRUCTIONS  Watch your condition for any changes. Take these steps to help with your condition: Managing Pain  Take over-the-counter and prescription medicines only as told by your health care provider.  Lie down in a dark, quiet room when you have a headache.  If directed, apply ice to the head and neck area:  Put ice in a plastic bag.  Place a towel between your skin and the bag.  Leave the ice on for 20 minutes, 2-3 times per day.  Use a heating pad or hot shower to apply heat to the head and neck area as told by your health care provider.  Keep lights dim if bright lights bother you or make your headaches worse. Eating and Drinking  Eat meals on a regular schedule.  Limit alcohol use.  Decrease the amount of caffeine you drink, or stop drinking caffeine. General Instructions  Keep all follow-up visits as told by your health care provider. This is important.  Keep a headache journal to help find out what may trigger your headaches. For example, write down:  What you eat and drink.  How much sleep you get.  Any change to your diet or medicines.  Try massage or other relaxation techniques.  Limit stress.  Sit up straight, and do not tense your muscles.  Do not use tobacco products, including cigarettes, chewing tobacco, or e-cigarettes. If you need help quitting, ask your health care provider.  Exercise regularly as told by your health care provider.  Sleep on a regular schedule. Get 7-9 hours of sleep, or the amount  recommended by your health care provider. SEEK MEDICAL CARE IF:   Your symptoms are not helped by medicine.  You have a headache that is different from the usual headache.  You have nausea or you vomit.  You have a fever. SEEK IMMEDIATE MEDICAL CARE IF:   Your headache becomes severe.  You have repeated vomiting.  You have a stiff neck.  You have a loss of vision.  You have problems with speech.  You have pain in the eye or ear.  You have muscular weakness or loss of muscle control.  You lose your balance or have trouble walking.  You feel faint or pass out.  You have confusion.   This information is not intended to replace advice given to you by your health care provider. Make sure you discuss any questions you have with your health care provider.   Document Released: 08/17/2005 Document Revised: 05/08/2015 Document Reviewed: 12/10/2014 Elsevier Interactive Patient Education [employer].

## 2016-02-07 ENCOUNTER — Emergency Department (HOSPITAL_COMMUNITY)
Admission: EM | Admit: 2016-02-07 | Discharge: 2016-02-07 | Disposition: A | Discharge status: Home / Self Care | Payer: BLUE CROSS/BLUE SHIELD | Attending: Emergency Medicine | Admitting: Emergency Medicine

## 2016-02-07 ENCOUNTER — Encounter (HOSPITAL_COMMUNITY): Payer: Self-pay | Admitting: Emergency Medicine

## 2016-02-07 DIAGNOSIS — Z79899 Other long term (current) drug therapy: Secondary | ICD-10-CM | POA: Insufficient documentation

## 2016-02-07 DIAGNOSIS — R519 Headache, unspecified: Secondary | ICD-10-CM

## 2016-02-07 DIAGNOSIS — J45909 Unspecified asthma, uncomplicated: Secondary | ICD-10-CM | POA: Insufficient documentation

## 2016-02-07 DIAGNOSIS — Z7982 Long term (current) use of aspirin: Secondary | ICD-10-CM | POA: Insufficient documentation

## 2016-02-07 DIAGNOSIS — R51 Headache: Secondary | ICD-10-CM | POA: Insufficient documentation

## 2016-02-07 DIAGNOSIS — F17210 Nicotine dependence, cigarettes, uncomplicated: Secondary | ICD-10-CM | POA: Insufficient documentation

## 2016-02-07 LAB — CBC WITH DIFFERENTIAL/PLATELET
Basophils Absolute: 0 10*3/uL (ref 0.0–0.1)
Basophils Relative: 0 %
EOS ABS: 0.2 10*3/uL (ref 0.0–0.7)
Eosinophils Relative: 2 %
HEMATOCRIT: 32 % — AB (ref 36.0–46.0)
HEMOGLOBIN: 10.5 g/dL — AB (ref 12.0–15.0)
Lymphocytes Relative: 35 %
Lymphs Abs: 3.3 10*3/uL (ref 0.7–4.0)
MCH: 25.4 pg — AB (ref 26.0–34.0)
MCHC: 32.8 g/dL (ref 30.0–36.0)
MCV: 77.5 fL — ABNORMAL LOW (ref 78.0–100.0)
Monocytes Absolute: 1.1 10*3/uL — ABNORMAL HIGH (ref 0.1–1.0)
Monocytes Relative: 11 %
NEUTROS ABS: 5 10*3/uL (ref 1.7–7.7)
NEUTROS PCT: 52 %
PLATELETS: 360 10*3/uL (ref 150–400)
RBC: 4.13 MIL/uL (ref 3.87–5.11)
RDW: 18.6 % — AB (ref 11.5–15.5)
WBC: 9.6 10*3/uL (ref 4.0–10.5)

## 2016-02-07 LAB — BASIC METABOLIC PANEL
ANION GAP: 4 — AB (ref 5–15)
BUN: 9 mg/dL (ref 6–20)
CALCIUM: 9 mg/dL (ref 8.9–10.3)
CO2: 24 mmol/L (ref 22–32)
CREATININE: 0.7 mg/dL (ref 0.44–1.00)
Chloride: 107 mmol/L (ref 101–111)
GFR calc Af Amer: >60 mL/min (ref >60)
GLUCOSE: 114 mg/dL — AB (ref 65–99)
Potassium: 3.4 mmol/L — ABNORMAL LOW (ref 3.5–5.1)
Sodium: 135 mmol/L (ref 135–145)

## 2016-02-07 MED ORDER — SODIUM CHLORIDE 0.9 % IV BOLUS (SEPSIS)
1000.0000 mL | Freq: Once | INTRAVENOUS | Status: AC
  Administered 2016-02-07: 1000 mL via INTRAVENOUS

## 2016-02-07 MED ORDER — BUTALBITAL-APAP-CAFFEINE 50-325-40 MG PO TABS: 1.0000 | ORAL_TABLET | Freq: Four times a day (QID) | ORAL | Status: DC | PRN

## 2016-02-07 MED ORDER — PROCHLORPERAZINE EDISYLATE 5 MG/ML IJ SOLN
10.0000 mg | Freq: Once | INTRAMUSCULAR | Status: AC
  Administered 2016-02-07: 10 mg via INTRAVENOUS
  Filled 2016-02-07: qty 2

## 2016-02-07 MED ORDER — DIPHENHYDRAMINE HCL 50 MG/ML IJ SOLN
25.0000 mg | Freq: Once | INTRAMUSCULAR | Status: AC
  Administered 2016-02-07: 25 mg via INTRAVENOUS
  Filled 2016-02-07: qty 1

## 2016-02-07 MED ORDER — POTASSIUM CHLORIDE CRYS ER 20 MEQ PO TBCR
40.0000 meq | EXTENDED_RELEASE_TABLET | Freq: Once | ORAL | Status: AC
  Administered 2016-02-07: 40 meq via ORAL
  Filled 2016-02-07: qty 2

## 2016-02-07 MED ORDER — KETOROLAC TROMETHAMINE 30 MG/ML IJ SOLN
15.0000 mg | Freq: Once | INTRAMUSCULAR | Status: AC
  Administered 2016-02-07: 15 mg via INTRAVENOUS
  Filled 2016-02-07: qty 1

## 2016-02-07 NOTE — ED Notes (Signed)
Pt reports understanding of discharge information. No questions at time of discharge 

## 2016-02-07 NOTE — Progress Notes (Signed)
EDCM spoke to patient at bedside. Patient confirms she does not have a pcp or insurance living in [place].  [facility] provided patient with contact information  to [facility], informed patient of services there and walk in times.  EDCM also provided patient with list of pcps who accept self pay patients, list of discount pharmacies and websites needymeds.org and GoodRX.com for medication assistance, phone number to inquire about the orange card, phone number to inquire about Medicaid, phone number to inquire about the [place], financial resources in the community such as local churches, [employer], urban ministries, and dental assistance for uninsured patients.  Patient thankful for resources.  No further EDCM needs at this time.

## 2016-02-07 NOTE — ED Notes (Signed)
Pt from home with complaints of frequent headaches that come with light sensitivity and "tingling fingers" Pt states that this headache began today, but they have been present on and off for 1 month. Pt states her grandmother had a stroke, so she is concerned about that. Pt has a normal neuro exam.

## 2016-02-07 NOTE — ED Provider Notes (Signed)
CSN: [id number]     Arrival date & time 02/07/16  2007 History   First MD Initiated Contact with Patient 02/07/16 2111     Chief Complaint  Patient presents with  . Migraine     (Consider location/radiation/quality/duration/timing/severity/associated sxs/prior Treatment) HPI   Patient is a 20 year old female with no pertinent past medical history presents the ED with complaint of headache, onset 8 AM. Patient reports having worsening intermittent throbbing/aching headache that starts in her posterior scalp and radiates to the left side of her head. Denies any aggravating or alleviating factors. Endorses associated light sensitivity and intermittent lightheadedness. Patient reports having similar headaches over the past 2 months. She notes she typically will get the headaches every 1-2 days. She notes she has been taking Motrin at home with mild intermittent relief. Denies history of headaches/migraines prior to onset of symptoms 2 months ago. Patient also reports this evening after she woke up from a nap around 6 PM she had numbness and tingling to her left arm after laying on her left side while sleeping. She notes the symptoms resolved over the next hour and denies any numbness or tingling at this time. Pt denies fever, neck stiffness, visual changes, abdominal pain, N/V, urinary symptoms, tingling, weakness, seizures, syncope.  Patient notes she was seen in the ED on 01/31/16 with similar headache and was discharged home with medications but notes she has not followed up with anyone outpatient due to having any insurance.  Past Medical History  Diagnosis Date  . Asthma   . Anemia   . Infection     UTI  . Chlamydia     age 17   Past Surgical History  Procedure Laterality Date  . Tonsillectomy     Family History  Problem Relation Age of Onset  . Heart disease Paternal Grandmother   . Stroke Paternal Grandmother    Social History  Substance Use Topics  . Smoking status: Current Every  Day Smoker -- 0.25 packs/day for 1 years    Types: Cigars  . Smokeless tobacco: Never Used     Comment: black and mild  . Alcohol Use: Yes     Comment: occasional   OB History    Gravida Para Term Preterm AB TAB SAB Ectopic Multiple Living   0              Review of Systems  Eyes: Positive for photophobia.  Neurological: Positive for light-headedness and headaches.  All other systems reviewed and are negative.     Allergies  Review of patient's allergies indicates no known allergies.  Home Medications   Prior to Admission medications   Medication Sig Start Date End Date Taking? Authorizing Provider  Aspirin-Salicylamide-Caffeine (BC HEADACHE PO) Take 1 packet by mouth daily as needed (migraine).   Yes Historical Provider, MD  butalbital-acetaminophen-caffeine [facility]) [phone] MG tablet Take 1-2 tablets by mouth every 6 (six) hours as needed for headache. 02/07/16 02/06/17  Nona Dell, PA-C  ondansetron (ZOFRAN ODT) 8 MG disintegrating tablet 8mg  ODT q4 hours prn nausea 01/18/16   Ripley Fraise, MD   BP 110/66 mmHg  Pulse 76  Temp(Src) 98.5 F (36.9 C) (Oral)  Resp 18  Ht 5\' 5"  (1.651 m)  Wt 48.081 kg  BMI 17.64 kg/m2  SpO2 100%  LMP 01/20/2016 (Approximate) Physical Exam  Constitutional: She is oriented to person, place, and time. She appears well-developed and well-nourished. No distress.  HENT:  Head: Normocephalic and atraumatic.  Right Ear: Tympanic membrane normal.  Left Ear: Tympanic membrane normal.  Nose: Nose normal. Right sinus exhibits no maxillary sinus tenderness and no frontal sinus tenderness. Left sinus exhibits no maxillary sinus tenderness and no frontal sinus tenderness.  Mouth/Throat: Uvula is midline, oropharynx is clear and moist and mucous membranes are normal. No oropharyngeal exudate, posterior oropharyngeal edema, posterior oropharyngeal erythema or tonsillar abscesses.  Eyes: Conjunctivae and EOM are normal. Pupils are equal,  round, and reactive to light. Right eye exhibits no discharge. Left eye exhibits no discharge. No scleral icterus.  Neck: Normal range of motion. Neck supple.  Cardiovascular: Normal rate, regular rhythm, normal heart sounds and intact distal pulses.   Pulmonary/Chest: Effort normal and breath sounds normal. No respiratory distress. She has no wheezes. She has no rales. She exhibits no tenderness.  Abdominal: Soft. Bowel sounds are normal. She exhibits no distension and no mass. There is no tenderness. There is no rebound and no guarding.  Musculoskeletal: Normal range of motion. She exhibits no edema.  Mild TTP over left cervical paraspinal muscles and left upper trapezius. FROM of neck and BUE with 5/5 strength. 2+ radial pulses. Sensation grossly intact.   Lymphadenopathy:    She has no cervical adenopathy.  Neurological: She is alert and oriented to person, place, and time. She has normal strength. No cranial nerve deficit or sensory deficit. She displays a negative Romberg sign. Coordination normal.  Skin: Skin is warm and dry. She is not diaphoretic.  Nursing note and vitals reviewed.   ED Course  Procedures (including critical care time) Labs Review Labs Reviewed  CBC WITH DIFFERENTIAL/PLATELET - Abnormal; Notable for the following:    Hemoglobin 10.5 (*)    HCT 32.0 (*)    MCV 77.5 (*)    MCH 25.4 (*)    RDW 18.6 (*)    Monocytes Absolute 1.1 (*)    All other components within normal limits  BASIC METABOLIC PANEL - Abnormal; Notable for the following:    Potassium 3.4 (*)    Glucose, Bld 114 (*)    Anion gap 4 (*)    All other components within normal limits    Imaging Review No results found. I have personally reviewed and evaluated these images and lab results as part of my medical decision-making.   EKG Interpretation None      MDM   Final diagnoses:  Nonintractable headache, unspecified chronicity pattern, unspecified headache type    Patient presents with  headache which she states is consistent with headaches she has had frequently over the past few months. Endorses associated light sensitivity. Denies fever, neck stiffness. VSS. Exam unremarkable, no neuro deficits. Pt given IVF and migraine cocktail in the ED. Potassium 3.4, pt given PO potassium supplement in the ED. On reevaluation, pt reports her HA has improved while in ED.  Presentation is like pts typical HA and non concerning for [facility], ICH, Meningitis, or temporal arteritis. Chart review shows pt had a CT head performed during her ED visit on 01/31/16 for similar HA which was unremarkable. Pt is afebrile with no focal neuro deficits, nuchal rigidity, or change in vision. Pt is to follow up with PCP to discuss prophylactic medication, pt given info to establish care with PCP. Pt verbalizes understanding and is agreeable with plan to dc.      Chesley Noon Dane, [place] 02/08/16 [id number]  Isla Pence, MD 02/08/16 [phone]

## 2016-02-07 NOTE — Discharge Instructions (Signed)
Take your medications as prescribed as needed for headache. Please follow up with a primary care provider from the Resource Guide provided below in the next 4-5 days for further management and evaluation of your headaches. Please return to the Emergency Department if symptoms worsen or new onset of fever, neck stiffness, visual changes, abdominal pain, vomiting, urinary symptoms, numbness, tingling, weakness, seizures, syncope.

## 2016-04-03 ENCOUNTER — Emergency Department (HOSPITAL_COMMUNITY)
Admission: EM | Admit: 2016-04-03 | Discharge: 2016-04-03 | Discharge status: Against Medical Advice | Payer: BLUE CROSS/BLUE SHIELD

## 2016-04-03 NOTE — ED Notes (Signed)
Writer called for room assignment for triage room, no response.

## 2016-04-18 ENCOUNTER — Encounter (HOSPITAL_COMMUNITY): Payer: Self-pay | Admitting: Certified Nurse Midwife

## 2016-04-18 ENCOUNTER — Observation Stay (HOSPITAL_COMMUNITY)
Admission: AD | Admit: 2016-04-18 | Discharge: 2016-04-19 | Disposition: A | Discharge status: Home / Self Care | Payer: Self-pay | Source: Ambulatory Visit | Attending: Obstetrics and Gynecology | Admitting: Obstetrics and Gynecology

## 2016-04-18 ENCOUNTER — Inpatient Hospital Stay (HOSPITAL_COMMUNITY): Payer: Self-pay

## 2016-04-18 DIAGNOSIS — J45909 Unspecified asthma, uncomplicated: Secondary | ICD-10-CM | POA: Insufficient documentation

## 2016-04-18 DIAGNOSIS — D473 Essential (hemorrhagic) thrombocythemia: Secondary | ICD-10-CM

## 2016-04-18 DIAGNOSIS — R109 Unspecified abdominal pain: Secondary | ICD-10-CM

## 2016-04-18 DIAGNOSIS — D75839 Thrombocytosis, unspecified: Secondary | ICD-10-CM | POA: Diagnosis present

## 2016-04-18 DIAGNOSIS — D509 Iron deficiency anemia, unspecified: Principal | ICD-10-CM

## 2016-04-18 DIAGNOSIS — D649 Anemia, unspecified: Secondary | ICD-10-CM

## 2016-04-18 DIAGNOSIS — F17210 Nicotine dependence, cigarettes, uncomplicated: Secondary | ICD-10-CM | POA: Insufficient documentation

## 2016-04-18 LAB — COMPREHENSIVE METABOLIC PANEL
ALK PHOS: 47 U/L (ref 38–126)
ALT: 22 U/L (ref 14–54)
AST: 31 U/L (ref 15–41)
Albumin: 4 g/dL (ref 3.5–5.0)
Anion gap: 8 (ref 5–15)
BILIRUBIN TOTAL: 0.2 mg/dL — AB (ref 0.3–1.2)
BUN: 8 mg/dL (ref 6–20)
CALCIUM: 9.3 mg/dL (ref 8.9–10.3)
CHLORIDE: 109 mmol/L (ref 101–111)
CO2: 19 mmol/L — ABNORMAL LOW (ref 22–32)
CREATININE: 0.77 mg/dL (ref 0.44–1.00)
Glucose, Bld: 96 mg/dL (ref 65–99)
Potassium: 4.3 mmol/L (ref 3.5–5.1)
Sodium: 136 mmol/L (ref 135–145)
Total Protein: 6.8 g/dL (ref 6.5–8.1)

## 2016-04-18 LAB — CBC WITH DIFFERENTIAL/PLATELET
Basophils Absolute: 0.1 10*3/uL (ref 0.0–0.1)
Basophils Relative: 1 %
EOS ABS: 0.2 10*3/uL (ref 0.0–0.7)
Eosinophils Relative: 2 %
HCT: 16.7 % — ABNORMAL LOW (ref 36.0–46.0)
HEMOGLOBIN: 4.8 g/dL — AB (ref 12.0–15.0)
LYMPHS PCT: 32 %
Lymphs Abs: 3.3 10*3/uL (ref 0.7–4.0)
MCH: 20.5 pg — ABNORMAL LOW (ref 26.0–34.0)
MCHC: 28.7 g/dL — ABNORMAL LOW (ref 30.0–36.0)
MCV: 71.4 fL — ABNORMAL LOW (ref 78.0–100.0)
MONOS PCT: 6 %
Monocytes Absolute: 0.6 10*3/uL (ref 0.1–1.0)
NEUTROS PCT: 59 %
Neutro Abs: 6.1 10*3/uL (ref 1.7–7.7)
Other: 0 %
PLATELETS: 819 10*3/uL — AB (ref 150–400)
RBC: 2.34 MIL/uL — AB (ref 3.87–5.11)
RDW: 18.5 % — ABNORMAL HIGH (ref 11.5–15.5)
WBC: 10.3 10*3/uL (ref 4.0–10.5)

## 2016-04-18 LAB — IRON AND TIBC
Iron: <5 ug/dL — ABNORMAL LOW (ref 28–170)
TIBC: 477 ug/dL — ABNORMAL HIGH (ref 250–450)

## 2016-04-18 LAB — RAPID URINE DRUG SCREEN, HOSP PERFORMED
Amphetamines: NOT DETECTED
BARBITURATES: NOT DETECTED
BENZODIAZEPINES: NOT DETECTED
Cocaine: NOT DETECTED
Opiates: NOT DETECTED
TETRAHYDROCANNABINOL: POSITIVE — AB

## 2016-04-18 LAB — CBC
HEMATOCRIT: 17 % — AB (ref 36.0–46.0)
HEMOGLOBIN: 4.9 g/dL — AB (ref 12.0–15.0)
MCH: 20.5 pg — AB (ref 26.0–34.0)
MCHC: 28.8 g/dL — ABNORMAL LOW (ref 30.0–36.0)
MCV: 71.1 fL — AB (ref 78.0–100.0)
Platelets: 850 10*3/uL — ABNORMAL HIGH (ref 150–400)
RBC: 2.39 MIL/uL — AB (ref 3.87–5.11)
RDW: 18.5 % — ABNORMAL HIGH (ref 11.5–15.5)
WBC: 10 10*3/uL (ref 4.0–10.5)

## 2016-04-18 LAB — URINALYSIS, ROUTINE W REFLEX MICROSCOPIC
Glucose, UA: NEGATIVE mg/dL
Ketones, ur: NEGATIVE mg/dL
NITRITE: POSITIVE — AB
Protein, ur: 100 mg/dL — AB
pH: 5.5 (ref 5.0–8.0)

## 2016-04-18 LAB — PREPARE RBC (CROSSMATCH)

## 2016-04-18 LAB — WET PREP, GENITAL
Sperm: NONE SEEN
Trich, Wet Prep: NONE SEEN
Yeast Wet Prep HPF POC: NONE SEEN

## 2016-04-18 LAB — POCT PREGNANCY, URINE: PREG TEST UR: NEGATIVE

## 2016-04-18 LAB — URINE MICROSCOPIC-ADD ON

## 2016-04-18 LAB — FOLATE: FOLATE: 11.1 ng/mL (ref >5.9)

## 2016-04-18 LAB — HCG, QUANTITATIVE, PREGNANCY

## 2016-04-18 LAB — ABO/RH: ABO/RH(D): B POS

## 2016-04-18 LAB — VITAMIN B12: Vitamin B-12: 438 pg/mL (ref 180–914)

## 2016-04-18 MED ORDER — IBUPROFEN 800 MG PO TABS: 800.0000 mg | ORAL_TABLET | Freq: Three times a day (TID) | ORAL | Status: DC | PRN

## 2016-04-18 MED ORDER — SENNOSIDES-DOCUSATE SODIUM 8.6-50 MG PO TABS: 1.0000 | ORAL_TABLET | Freq: Every evening | ORAL | Status: DC | PRN

## 2016-04-18 MED ORDER — FERROUS GLUCONATE 324 (38 FE) MG PO TABS
324.0000 mg | ORAL_TABLET | Freq: Every day | ORAL | Status: DC
  Filled 2016-04-18 (×2): qty 1

## 2016-04-18 MED ORDER — ONDANSETRON HCL 4 MG/2ML IJ SOLN: 4.0000 mg | Freq: Four times a day (QID) | INTRAMUSCULAR | Status: DC | PRN

## 2016-04-18 MED ORDER — ONDANSETRON HCL 4 MG PO TABS: 4.0000 mg | ORAL_TABLET | Freq: Four times a day (QID) | ORAL | Status: DC | PRN

## 2016-04-18 MED ORDER — DIPHENHYDRAMINE HCL 25 MG PO CAPS
25.0000 mg | ORAL_CAPSULE | Freq: Once | ORAL | Status: AC
  Administered 2016-04-18: 25 mg via ORAL
  Filled 2016-04-18: qty 1

## 2016-04-18 MED ORDER — SODIUM CHLORIDE 0.9 % IV SOLN
INTRAVENOUS | Status: DC
  Administered 2016-04-19: via INTRAVENOUS

## 2016-04-18 MED ORDER — ALUM & MAG HYDROXIDE-SIMETH 200-200-20 MG/5ML PO SUSP: 30.0000 mL | ORAL | Status: DC | PRN

## 2016-04-18 MED ORDER — ZOLPIDEM TARTRATE 5 MG PO TABS: 5.0000 mg | ORAL_TABLET | Freq: Every evening | ORAL | Status: DC | PRN

## 2016-04-18 MED ORDER — ACETAMINOPHEN 325 MG PO TABS
650.0000 mg | ORAL_TABLET | Freq: Once | ORAL | Status: AC
Start: 2016-04-18 — End: 2016-04-18
  Administered 2016-04-18: 650 mg via ORAL
  Filled 2016-04-18: qty 2

## 2016-04-18 MED ORDER — SODIUM CHLORIDE 0.9 % IV SOLN
Freq: Once | INTRAVENOUS | Status: AC
  Administered 2016-04-18: 18:00:00 via INTRAVENOUS

## 2016-04-18 MED ORDER — IBUPROFEN 800 MG PO TABS
800.0000 mg | ORAL_TABLET | Freq: Once | ORAL | Status: AC
  Administered 2016-04-18: 800 mg via ORAL
  Filled 2016-04-18: qty 1

## 2016-04-18 NOTE — MAU Note (Signed)
Pt states she took several HPTs at home 2 weeks ago that were +. Pt started bleeding and cramping this AM.

## 2016-04-18 NOTE — MAU Note (Signed)
CRITICAL VALUE ALERT  Critical value received:  Hemoglobin 4.8  Date of notification:  04/18/2016  Time of notification:  1400  Critical value read back: Yes  Nurse who received alert:  Arlis Porta RNC  MD notified (1st page):  Rockwell Alexandria CNM  Time of first page:  60  MD notified (2nd page):  Time of second page:  Responding MD:  Rockwell Alexandria CNM  Time MD responded:  [phone]

## 2016-04-18 NOTE — H&P (Signed)
Stacy Warner is an 20 y.o. female. She presented to MAU with c/o lower abd cramps and Vb. Had +UPT a few weeks ago. Sexual active without contraception. Cycles qmonhtly and last 3-4 days, not heavy. LMP in July however only lasted 2 days. Had some cramps and single episode of heavy bleeding this morning. She came to MAU for further eval. W/U in MAU revealed negative UPT and BHCG, normal GYN U/S.  Pertinent Gynecological History: Menses: regular every month with spotting approximately 3-4  days per month Contraception: none Blood transfusions: none Sexually transmitted diseases: past history:  Previous GYN Procedures: none  Last mammogram: N/A Date: N/A Last pap: normal Date: unsure OB History: G0, P0   Menstrual History: Menarche age: 75 Patient's last menstrual period was 02/29/2016 (approximate).    Past Medical History:  Diagnosis Date  . Anemia   . Asthma   . Chlamydia    age 74  . Infection    UTI    Past Surgical History:  Procedure Laterality Date  . TONSILLECTOMY      Family History  Problem Relation Age of Onset  . Heart disease Paternal Grandmother   . Stroke Paternal Grandmother     Social History:  reports that she has been smoking Cigars.  She has a 0.25 pack-year smoking history. She has never used smokeless tobacco. She reports that she uses drugs, including Marijuana. She reports that she does not drink alcohol.  Allergies: No Known Allergies  Prescriptions Prior to Admission  Medication Sig Dispense Refill Last Dose  . POTASSIUM PO Take 1 tablet by mouth daily.     . butalbital-acetaminophen-caffeine (FIORICET) 50-325-40 MG tablet Take 1-2 tablets by mouth every 6 (six) hours as needed for headache. (Patient not taking: Reported on 04/18/2016) 20 tablet 0 Not Taking at Unknown time  . ondansetron (ZOFRAN ODT) 8 MG disintegrating tablet 8mg  ODT q4 hours prn nausea (Patient not taking: Reported on 04/18/2016) 4 tablet 0 Not Taking at Unknown time     Review of Systems  HENT: Negative.   Eyes: Negative.   Respiratory: Negative.   Cardiovascular: Negative.   Gastrointestinal: Negative.   Genitourinary: Negative.   Skin: Negative.   Neurological: Positive for weakness.    Blood pressure 125/73, pulse 106, temperature 99 F (37.2 C), temperature source Oral, resp. rate 20, last menstrual period 02/29/2016. Physical Exam  Constitutional: She appears well-developed and well-nourished.  HENT:  Head: Normocephalic.  Eyes: Pupils are equal, round, and reactive to light.  Neck: Neck supple. No thyromegaly present.  Cardiovascular: Regular rhythm.   Respiratory: Effort normal and breath sounds normal.  GI: Soft. Bowel sounds are normal.  Genitourinary: Vagina normal and uterus normal.  Genitourinary Comments: Scant menses noted    Results for orders placed or performed during the hospital encounter of 04/18/16 (from the past 24 hour(s))  Urinalysis, Routine w reflex microscopic (not at [facility])     Status: Abnormal   Collection Time: 04/18/16  1:10 PM  Result Value Ref Range   Color, Urine YELLOW YELLOW   APPearance CLOUDY (A) CLEAR   Specific Gravity, Urine >1.030 (H) 1.005 - 1.030   pH 5.5 5.0 - 8.0   Glucose, UA NEGATIVE NEGATIVE mg/dL   Hgb urine dipstick LARGE (A) NEGATIVE   Bilirubin Urine SMALL (A) NEGATIVE   Ketones, ur NEGATIVE NEGATIVE mg/dL   Protein, ur 100 (A) NEGATIVE mg/dL   Nitrite POSITIVE (A) NEGATIVE   Leukocytes, UA SMALL (A) NEGATIVE  Urine microscopic-add on  Status: Abnormal   Collection Time: 04/18/16  1:10 PM  Result Value Ref Range   Squamous Epithelial / LPF 6-30 (A) NONE SEEN   WBC, UA 6-30 0 - 5 WBC/hpf   RBC / HPF TOO NUMEROUS TO COUNT 0 - 5 RBC/hpf   Bacteria, UA MANY (A) NONE SEEN  Urine rapid drug screen (hosp performed)     Status: Abnormal   Collection Time: 04/18/16  1:10 PM  Result Value Ref Range   Opiates NONE DETECTED NONE DETECTED   Cocaine NONE DETECTED NONE DETECTED    Benzodiazepines NONE DETECTED NONE DETECTED   Amphetamines NONE DETECTED NONE DETECTED   Tetrahydrocannabinol POSITIVE (A) NONE DETECTED   Barbiturates NONE DETECTED NONE DETECTED  Pregnancy, urine POC     Status: None   Collection Time: 04/18/16  1:16 PM  Result Value Ref Range   Preg Test, Ur NEGATIVE NEGATIVE  ABO/Rh     Status: None (Preliminary result)   Collection Time: 04/18/16  1:17 PM  Result Value Ref Range   ABO/RH(D) B POS   CBC with Differential/Platelet     Status: Abnormal   Collection Time: 04/18/16  1:17 PM  Result Value Ref Range   WBC 10.3 4.0 - 10.5 K/uL   RBC 2.34 (L) 3.87 - 5.11 MIL/uL   Hemoglobin 4.8 (LL) 12.0 - 15.0 g/dL   HCT 16.7 (L) 36.0 - 46.0 %   MCV 71.4 (L) 78.0 - 100.0 fL   MCH 20.5 (L) 26.0 - 34.0 pg   MCHC 28.7 (L) 30.0 - 36.0 g/dL   RDW 18.5 (H) 11.5 - 15.5 %   Platelets 819 (H) 150 - 400 K/uL   Neutrophils Relative % 59 %   Lymphocytes Relative 32 %   Monocytes Relative 6 %   Eosinophils Relative 2 %   Basophils Relative 1 %   Other 0 %   Neutro Abs 6.1 1.7 - 7.7 K/uL   Lymphs Abs 3.3 0.7 - 4.0 K/uL   Monocytes Absolute 0.6 0.1 - 1.0 K/uL   Eosinophils Absolute 0.2 0.0 - 0.7 K/uL   Basophils Absolute 0.1 0.0 - 0.1 K/uL   RBC Morphology SCHISTOCYTES NOTED ON SMEAR   hCG, quantitative, pregnancy     Status: None   Collection Time: 04/18/16  1:17 PM  Result Value Ref Range   hCG, Beta Chain, Quant, S <1 <5 mIU/mL  Wet prep, genital     Status: Abnormal   Collection Time: 04/18/16  2:25 PM  Result Value Ref Range   Yeast Wet Prep HPF POC NONE SEEN NONE SEEN   Trich, Wet Prep NONE SEEN NONE SEEN   Clue Cells Wet Prep HPF POC PRESENT (A) NONE SEEN   WBC, Wet Prep HPF POC FEW (A) NONE SEEN   Sperm NONE SEEN   CBC     Status: Abnormal   Collection Time: 04/18/16  2:52 PM  Result Value Ref Range   WBC 10.0 4.0 - 10.5 K/uL   RBC 2.39 (L) 3.87 - 5.11 MIL/uL   Hemoglobin 4.9 (LL) 12.0 - 15.0 g/dL   HCT 17.0 (L) 36.0 - 46.0 %   MCV 71.1  (L) 78.0 - 100.0 fL   MCH 20.5 (L) 26.0 - 34.0 pg   MCHC 28.8 (L) 30.0 - 36.0 g/dL   RDW 18.5 (H) 11.5 - 15.5 %   Platelets 850 (H) 150 - 400 K/uL  Comprehensive metabolic panel     Status: Abnormal   Collection Time: 04/18/16  2:52  PM  Result Value Ref Range   Sodium 136 135 - 145 mmol/L   Potassium 4.3 3.5 - 5.1 mmol/L   Chloride 109 101 - 111 mmol/L   CO2 19 (L) 22 - 32 mmol/L   Glucose, Bld 96 65 - 99 mg/dL   BUN 8 6 - 20 mg/dL   Creatinine, Ser 0.77 0.44 - 1.00 mg/dL   Calcium 9.3 8.9 - 10.3 mg/dL   Total Protein 6.8 6.5 - 8.1 g/dL   Albumin 4.0 3.5 - 5.0 g/dL   AST 31 15 - 41 U/L   ALT 22 14 - 54 U/L   Alkaline Phosphatase 47 38 - 126 U/L   Total Bilirubin 0.2 (L) 0.3 - 1.2 mg/dL   GFR calc non Af Amer >60 >60 mL/min   GFR calc Af Amer >60 >60 mL/min   Anion gap 8 5 - 15  Type and screen [place]     Status: None   Collection Time: 04/18/16  2:52 PM  Result Value Ref Range   ABO/RH(D) B POS    Antibody Screen NEG    Sample Expiration 04/21/2016     US Transvaginal Non-ob  Result Date: 04/18/2016 CLINICAL DATA:  Positive pregnancy test 2 weeks ago. Vaginal bleeding and cramping. Beta HCG level is less than 1 today. EXAM: TRANSABDOMINAL AND TRANSVAGINAL ULTRASOUND OF PELVIS TECHNIQUE: Both transabdominal and transvaginal ultrasound examinations of the pelvis were performed. Transabdominal technique was performed for global imaging of the pelvis including uterus, ovaries, adnexal regions, and pelvic cul-de-sac. It was necessary to proceed with endovaginal exam following the transabdominal exam to visualize the endometrium and adnexa to better advantage. COMPARISON:  None FINDINGS: Uterus Measurements: 7.4 x 3.2 x 4.0 cm. No fibroids or other mass visualized. Endometrium Thickness: 6.9 mm. There is some mild low attenuation material within the endometrial canal that shows movement during imaging. This is likely a small amount of endometrial canal hemorrhage.  No endometrial mass. Right ovary Measurements: 4.4 x 2.2 x 2.8 cm. Normal appearance/no adnexal mass. Left ovary Measurements: 4.0 x 2.8 x 2.8 cm. Normal appearance/no adnexal mass. Other findings Small amount pelvic free fluid. IMPRESSION: 1. Normal appearance of the uterus. 2. Small amount of complex fluid in the endometrial canal consistent with hemorrhage. No evidence of an intrauterine or ectopic pregnancy. No endometrial thickening or mass. 3. Normal ovaries and adnexa. Small amount pelvic free fluid is likely physiologic. Electronically Signed   By: Lajean Manes M.D.   On: 04/18/2016 15:51   US Pelvis Complete  Result Date: 04/18/2016 CLINICAL DATA:  Positive pregnancy test 2 weeks ago. Vaginal bleeding and cramping. Beta HCG level is less than 1 today. EXAM: TRANSABDOMINAL AND TRANSVAGINAL ULTRASOUND OF PELVIS TECHNIQUE: Both transabdominal and transvaginal ultrasound examinations of the pelvis were performed. Transabdominal technique was performed for global imaging of the pelvis including uterus, ovaries, adnexal regions, and pelvic cul-de-sac. It was necessary to proceed with endovaginal exam following the transabdominal exam to visualize the endometrium and adnexa to better advantage. COMPARISON:  None FINDINGS: Uterus Measurements: 7.4 x 3.2 x 4.0 cm. No fibroids or other mass visualized. Endometrium Thickness: 6.9 mm. There is some mild low attenuation material within the endometrial canal that shows movement during imaging. This is likely a small amount of endometrial canal hemorrhage. No endometrial mass. Right ovary Measurements: 4.4 x 2.2 x 2.8 cm. Normal appearance/no adnexal mass. Left ovary Measurements: 4.0 x 2.8 x 2.8 cm. Normal appearance/no adnexal mass. Other findings Small amount  pelvic free fluid. IMPRESSION: 1. Normal appearance of the uterus. 2. Small amount of complex fluid in the endometrial canal consistent with hemorrhage. No evidence of an intrauterine or ectopic pregnancy. No  endometrial thickening or mass. 3. Normal ovaries and adnexa. Small amount pelvic free fluid is likely physiologic. Electronically Signed   By: Lajean Manes M.D.   On: 04/18/2016 15:51    Assessment/Plan: Iron def anemia Thrombocythemia  Unable to identify a GYN cause for pt's above noted problems. Discussed with Dr. Lesly Dukes Heme/Onc at [facility]. Recommended admission for blood transfusion and begin w/u for anemia and iron supplemtation Once transfusion completed follow up as outpt. To call IM clinic at [phone] for follow up appt.  I discussed with pt and mother indications for admission. As well R/B for blood transfusion. She verbalized understanding and agrees to transfusion and admission for observation.    Chancy Milroy 04/18/2016, 5:21 PM

## 2016-04-18 NOTE — MAU Provider Note (Signed)
History   nulparous 20 YO BF in with abd pain and vag bleeding that started this morning. Mod amt vag bleeding per pt with sm clots. States BM's are normal. Does have migraine headaches and takes motrin and aleve OTC as directed. Pt thought she was pregnant had several pos HPT.  CSN: [id number]  Arrival date & time 04/18/16  1303   None     Chief Complaint  Patient presents with  . Vaginal Bleeding    HPI  Past Medical History:  Diagnosis Date  . Anemia   . Asthma   . Chlamydia    age 20  . Infection    UTI    Past Surgical History:  Procedure Laterality Date  . TONSILLECTOMY      Family History  Problem Relation Age of Onset  . Heart disease Paternal Grandmother   . Stroke Paternal Grandmother     Social History  Substance Use Topics  . Smoking status: Current Every Day Smoker    Packs/day: 0.25    Years: 1.00    Types: Cigars  . Smokeless tobacco: Never Used     Comment: black and mild  . Alcohol use No     Comment: occasional    OB History    Gravida Para Term Preterm AB Living   0             SAB TAB Ectopic Multiple Live Births                  Review of Systems  Constitutional: Negative.   HENT: Negative.   Eyes: Negative.   Respiratory: Negative.   Cardiovascular: Negative.   Gastrointestinal: Positive for abdominal pain.  Endocrine: Negative.   Genitourinary: Positive for vaginal bleeding.  Musculoskeletal: Negative.   Allergic/Immunologic: Negative.   Neurological: Negative.   Hematological: Negative.   Psychiatric/Behavioral: Negative.     Allergies  Review of patient's allergies indicates no known allergies.  Home Medications    BP 125/73 (BP Location: Left Arm)   Pulse 106   Temp 99 F (37.2 C) (Oral)   Resp 20   LMP 02/29/2016 (Approximate)   Physical Exam  Constitutional: She is oriented to person, place, and time. She appears well-developed and well-nourished.  HENT:  Head: Normocephalic.  Eyes: Pupils are equal,  round, and reactive to light.  Neck: Normal range of motion.  Cardiovascular: Normal rate, regular rhythm, normal heart sounds and intact distal pulses.   Pulmonary/Chest: Effort normal and breath sounds normal.  Abdominal: Soft. Bowel sounds are normal. There is tenderness.  Genitourinary: Vagina normal and uterus normal.  Musculoskeletal: Normal range of motion.  Neurological: She is alert and oriented to person, place, and time. She has normal reflexes.  Skin: Skin is warm and dry.  Psychiatric: She has a normal mood and affect. Her behavior is normal. Judgment and thought content normal.    MAU Course  Procedures (including critical care time)  Labs Reviewed  URINALYSIS, ROUTINE W REFLEX MICROSCOPIC (NOT AT [facility]) - Abnormal; Notable for the following:       Result Value   APPearance CLOUDY (*)    Specific Gravity, Urine >1.030 (*)    Hgb urine dipstick LARGE (*)    Bilirubin Urine SMALL (*)    Protein, ur 100 (*)    Nitrite POSITIVE (*)    Leukocytes, UA SMALL (*)    All other components within normal limits  CBC WITH DIFFERENTIAL/PLATELET - Abnormal; Notable for the following:  RBC 2.34 (*)    Hemoglobin 4.8 (*)    HCT 16.7 (*)    MCV 71.4 (*)    MCH 20.5 (*)    MCHC 28.7 (*)    RDW 18.5 (*)    Platelets 819 (*)    All other components within normal limits  URINE MICROSCOPIC-ADD ON - Abnormal; Notable for the following:    Squamous Epithelial / LPF 6-30 (*)    Bacteria, UA MANY (*)    All other components within normal limits  HCG, QUANTITATIVE, PREGNANCY  URINE RAPID DRUG SCREEN, HOSP PERFORMED  HIV ANTIBODY (ROUTINE TESTING)  POCT PREGNANCY, URINE  ABO/RH  GC/CHLAMYDIA PROBE AMP ([place]) NOT AT [facility]   No results found.   No diagnosis found.    MDM  Hgb 4.8, Plts 819. Have sm amt vag bleeding at present time. Quant Hcg neg. abd is tender to palp. Cultures obtained. Pelvic US obtained. And Dr. Rip Harbour consulted.

## 2016-04-19 LAB — HEMOGLOBIN AND HEMATOCRIT, BLOOD
HEMATOCRIT: 24.2 % — AB (ref 36.0–46.0)
HEMOGLOBIN: 7.7 g/dL — AB (ref 12.0–15.0)

## 2016-04-19 LAB — TYPE AND SCREEN
ABO/RH(D): B POS
ANTIBODY SCREEN: NEGATIVE
UNIT DIVISION: 0
Unit division: 0

## 2016-04-19 LAB — HIV ANTIBODY (ROUTINE TESTING W REFLEX): HIV SCREEN 4TH GENERATION: NONREACTIVE

## 2016-04-19 MED ORDER — FERROUS GLUCONATE 324 (38 FE) MG PO TABS: 324.0000 mg | ORAL_TABLET | Freq: Every day | ORAL | 3 refills | Status: DC

## 2016-04-19 NOTE — Discharge Summary (Signed)
Physician Discharge Summary  Patient ID: Stacy Warner MRN: [id number] DOB/AGE: [birth year] 20 y.o.  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 She was transfused with 2 units of PRBC. She tolerated well. Felt amendable for d/c home after transfusion was completed. She was ambulating, voiding and tolerating diet without problems.   Consults: None  Significant Diagnostic Studies: IDA lab studies were started  Treatments: Blood transfusion  Discharge Exam: Blood pressure 101/62, pulse 76, temperature 97.2 F (36.2 C), temperature source Oral, resp. rate 18, height 5\' 5"  (1.651 m), weight 110 lb (49.9 kg), last menstrual period 02/29/2016, SpO2 100 %. Lungs clear Heart RRR Abd soft + BS Ext non tender  Disposition: D/C home   Discharge Instructions    Discharge patient    Complete by:  As directed       Medication List    TAKE these medications   butalbital-acetaminophen-caffeine 50-325-40 MG tablet Commonly known as:  FIORICET Take 1-2 tablets by mouth every 6 (six) hours as needed for headache.   ferrous gluconate 324 MG tablet Commonly known as:  FERGON Take 1 tablet (324 mg total) by mouth daily with breakfast.   ondansetron 8 MG disintegrating tablet Commonly known as:  ZOFRAN ODT 8mg  ODT q4 hours prn nausea   POTASSIUM PO Take 1 tablet by mouth daily.      Follow-up Information    [place]. Schedule an appointment as soon as possible for a visit in 1 week(s).   Why:  Please call [phone] to make follow up appt . Contact information: [address] [place] [id number]          Signed: Chancy Milroy 04/19/2016, 7:47 AM

## 2016-04-19 NOTE — Progress Notes (Signed)
Patient discharged home with boyfriend. Discharge instructions and paperwork reviewed. No questions at this time.

## 2016-04-19 NOTE — Discharge Instructions (Signed)
Anemia, Nonspecific Anemia is a condition in which the concentration of red blood cells or hemoglobin in the blood is below normal. Hemoglobin is a substance in red blood cells that carries oxygen to the tissues of the body. Anemia results in not enough oxygen reaching these tissues.  CAUSES  Common causes of anemia include:   Excessive bleeding. Bleeding may be internal or external. This includes excessive bleeding from periods (in women) or from the intestine.   Poor nutrition.   Chronic kidney, thyroid, and liver disease.  Bone marrow disorders that decrease red blood cell production.  Cancer and treatments for cancer.  HIV, AIDS, and their treatments.  Spleen problems that increase red blood cell destruction.  Blood disorders.  Excess destruction of red blood cells due to infection, medicines, and autoimmune disorders. SIGNS AND SYMPTOMS   Minor weakness.   Dizziness.   Headache.  Palpitations.   Shortness of breath, especially with exercise.   Paleness.  Cold sensitivity.  Indigestion.  Nausea.  Difficulty sleeping.  Difficulty concentrating. Symptoms may occur suddenly or they may develop slowly.  DIAGNOSIS  Additional blood tests are often needed. These help your health care provider determine the best treatment. Your health care provider will check your stool for blood and look for other causes of blood loss.  TREATMENT  Treatment varies depending on the cause of the anemia. Treatment can include:   Supplements of iron, vitamin B12, or folic acid.   Hormone medicines.   A blood transfusion. This may be needed if blood loss is severe.   Hospitalization. This may be needed if there is significant continual blood loss.   Dietary changes.  Spleen removal. HOME CARE INSTRUCTIONS Keep all follow-up appointments. It often takes many weeks to correct anemia, and having your health care provider check on your condition and your response to  treatment is very important. SEEK IMMEDIATE MEDICAL CARE IF:   You develop extreme weakness, shortness of breath, or chest pain.   You become dizzy or have trouble concentrating.  You develop heavy vaginal bleeding.   You develop a rash.   You have bloody or black, tarry stools.   You faint.   You vomit up blood.   You vomit repeatedly.   You have abdominal pain.  You have a fever or persistent symptoms for more than 2-3 days.   You have a fever and your symptoms suddenly get worse.   You are dehydrated.  MAKE SURE YOU:  Understand these instructions.  Will watch your condition.  Will get help right away if you are not doing well or get worse.   This information is not intended to replace advice given to you by your health care provider. Make sure you discuss any questions you have with your health care provider.   Document Released: 09/24/2004 Document Revised: 04/19/2013 Document Reviewed: 02/10/2013 Elsevier Interactive Patient Education 2016 Elsevier Inc[employer]  

## 2016-04-20 LAB — GC/CHLAMYDIA PROBE AMP (~~LOC~~) NOT AT ARMC
CHLAMYDIA, DNA PROBE: NEGATIVE
Neisseria Gonorrhea: NEGATIVE

## 2016-04-21 ENCOUNTER — Telehealth: Payer: Self-pay

## 2016-04-21 NOTE — Telephone Encounter (Signed)
Called pt - stated she had looked on My Chart for lab results and noticed under health problems "essential thrombocythemia" which she googled; wanted to know if she has cancer. Informed her, it's a blood disorder; and she has too many platelets which she was awared. And her doctor will explain in more details when she come Thursday to her appt.

## 2016-04-21 NOTE — Telephone Encounter (Signed)
Agree with plan to discuss further in person.

## 2016-04-21 NOTE — Telephone Encounter (Signed)
Questions about lab result.

## 2016-04-23 ENCOUNTER — Ambulatory Visit (INDEPENDENT_AMBULATORY_CARE_PROVIDER_SITE_OTHER): Payer: Self-pay | Admitting: Internal Medicine

## 2016-04-23 VITALS — BP 114/65 | HR 81 | Temp 98.0°F | Wt 107.9 lb

## 2016-04-23 DIAGNOSIS — D509 Iron deficiency anemia, unspecified: Secondary | ICD-10-CM

## 2016-04-23 DIAGNOSIS — Z23 Encounter for immunization: Secondary | ICD-10-CM

## 2016-04-23 DIAGNOSIS — F1220 Cannabis dependence, uncomplicated: Secondary | ICD-10-CM

## 2016-04-23 DIAGNOSIS — F1210 Cannabis abuse, uncomplicated: Secondary | ICD-10-CM

## 2016-04-23 DIAGNOSIS — Z720 Tobacco use: Secondary | ICD-10-CM | POA: Insufficient documentation

## 2016-04-23 DIAGNOSIS — F17210 Nicotine dependence, cigarettes, uncomplicated: Secondary | ICD-10-CM

## 2016-04-23 DIAGNOSIS — Z3009 Encounter for other general counseling and advice on contraception: Secondary | ICD-10-CM | POA: Insufficient documentation

## 2016-04-23 DIAGNOSIS — D473 Essential (hemorrhagic) thrombocythemia: Secondary | ICD-10-CM

## 2016-04-23 LAB — CBC
HEMATOCRIT: 30.4 % — AB (ref 36.0–46.0)
HEMOGLOBIN: 8.8 g/dL — AB (ref 12.0–15.0)
MCH: 22.9 pg — ABNORMAL LOW (ref 26.0–34.0)
MCHC: 28.9 g/dL — AB (ref 30.0–36.0)
MCV: 79.2 fL (ref 78.0–100.0)
Platelets: 819 10*3/uL — ABNORMAL HIGH (ref 150–400)
RBC: 3.84 MIL/uL — ABNORMAL LOW (ref 3.87–5.11)
RDW: 19.5 % — AB (ref 11.5–15.5)
WBC: 9.7 10*3/uL (ref 4.0–10.5)

## 2016-04-23 LAB — TECHNOLOGIST SMEAR REVIEW

## 2016-04-23 NOTE — Assessment & Plan Note (Addendum)
Noted to have thrombocythemia (new) at 850, 819 today which could be reactive from acute blood loss; however, her thrombocythemia persists after improvement of anemia. Patient continues to have persistent headaches likely from thrombocythemia.  Assessment: Thrombocythemia. Differential includes Reactive (from acute blood loss or iron deficiency), Essential (PV, CML, myeloproliferative disorder) or Autonomous  Plan: -Smear review -Check Ferritin, Iron, TIBC -CBC has resulted (Hgb 8.8) -Continue iron supplementation -Follow up in one month -Will discuss with Hematology based on the above results  ADDENDUM: -Platelets 819, still elevated. Discussed with Hematology. Likely secondary to severe acute iron deficiency anemia.  -Plan: Continue iron supplementation and recheck CBC in one month after allowing iron stores to replete. Unlikely to be a myelodysplastic disorder

## 2016-04-23 NOTE — Assessment & Plan Note (Signed)
Received flu vaccine.

## 2016-04-23 NOTE — Progress Notes (Signed)
    CC: iron deficiency  HPI: Stacy Warner is a 20 y.o. female with PMHx of Iron Deficiency Anemia and Essential Thrombocythemia who presents to the clinic for follow up for iron deficiency anemia.  Patient has a history of chronic microcytic anemia since 2012 with baseline hemoglobin of 10-12. She reports a light period with spotting last for 2 days 1.5 months ago. One month ago her urine pregnancy home test was positive. 2 weeks ago, patient developed cramping abdominal pain and vaginal bleeding and presented to the ED. Her hemoglobin at that time was 4.8 on 8/17. Anemia panel was collected which showed iron <5, TIBC 477, Folate 11 (normal), and Vitamin B12 normal at 438. She received 2 units of pRBCs with improvement of her hemoglobin to 7.7. Her hemoglobin today is 8.8, MCV 79.2. She was also noted to have thrombocythemia (new) at 850, 819 today which could be reactive. Pelvic and transvaginal ultrasound were normal. BMET was normal. Patient denies any history or family history of bleeding or clotting. She denies history of easy bruising or prolonged bleeding. She denies blood in her stool, urine, or sputum. She takes no daily medications. She normally has normal menstrual periods, once every month, lasting for 3-4 days, without menorrhagia. She is sexually active and does not use protection.   She smokes 1 cigarette per day. She smokes marijuana daily. She denies alcohol use.   Past Medical History:  Diagnosis Date  . Anemia   . Asthma   . Chlamydia    age 70  . Infection    UTI    Review of Systems: Please see pertinent ROS reviewed in HPI and problem based charting.   Physical Exam: Vitals:   04/23/16 1531  BP: 114/65  Pulse: 81  Temp: 98 F (36.7 C)  TempSrc: Oral  Weight: 107 lb 14.4 oz (48.9 kg)   General: Vital signs reviewed.  Patient is thin, in no acute distress and cooperative with exam.  Head: Normocephalic and atraumatic. Eyes: PERRLA, conjunctival pallor,  no scleral icterus.  Mouth: Normal oral mucosa and posterior oropharynx.  Neck: Supple, trachea midline, no thyromegaly, or carotid bruit present.  Cardiovascular: RRR, S1 normal, S2 normal, no murmurs, gallops, or rubs. Pulmonary/Chest: Clear to auscultation bilaterally, no wheezes, rales, or rhonchi. Abdominal: Soft, non-tender, non-distended, BS + Extremities: No lower extremity edema bilaterally Skin: Warm, dry and intact. No rashes or erythema. Psychiatric: Normal mood and affect. speech and behavior is normal. Cognition and memory are normal.   Assessment & Plan:  See encounters tab for problem based medical decision making. Patient discussed with Dr. Daryll Drown

## 2016-04-23 NOTE — Assessment & Plan Note (Signed)
Patient smokes 1 cigarette per day. We discussed quitting. She is willing to quit.

## 2016-04-23 NOTE — Assessment & Plan Note (Signed)
Patient admits to daily marijuana use.

## 2016-04-23 NOTE — Patient Instructions (Signed)
Please take your iron tablets once a day. Please work on quitting smoking- you can do this! We will check your hemoglobin and iron levels today and get in touch with you with the results.  Contraception Choices Contraception (birth control) is the use of any methods or devices to prevent pregnancy. Below are some methods to help avoid pregnancy. HORMONAL METHODS   Contraceptive implant. This is a thin, plastic tube containing progesterone hormone. It does not contain estrogen hormone. Your health care provider inserts the tube in the inner part of the upper arm. The tube can remain in place for up to 3 years. After 3 years, the implant must be removed. The implant prevents the ovaries from releasing an egg (ovulation), thickens the cervical mucus to prevent sperm from entering the uterus, and thins the lining of the inside of the uterus.  Progesterone-only injections. These injections are given every 3 months by your health care provider to prevent pregnancy. This synthetic progesterone hormone stops the ovaries from releasing eggs. It also thickens cervical mucus and changes the uterine lining. This makes it harder for sperm to survive in the uterus.  Birth control pills. These pills contain estrogen and progesterone hormone. They work by preventing the ovaries from releasing eggs (ovulation). They also cause the cervical mucus to thicken, preventing the sperm from entering the uterus. Birth control pills are prescribed by a health care provider.Birth control pills can also be used to treat heavy periods.  Minipill. This type of birth control pill contains only the progesterone hormone. They are taken every day of each month and must be prescribed by your health care provider.  Birth control patch. The patch contains hormones similar to those in birth control pills. It must be changed once a week and is prescribed by a health care provider.  Vaginal ring. The ring contains hormones similar to those  in birth control pills. It is left in the vagina for 3 weeks, removed for 1 week, and then a new one is put back in place. The patient must be comfortable inserting and removing the ring from the vagina.A health care provider's prescription is necessary.  Emergency contraception. Emergency contraceptives prevent pregnancy after unprotected sexual intercourse. This pill can be taken right after sex or up to 5 days after unprotected sex. It is most effective the sooner you take the pills after having sexual intercourse. Most emergency contraceptive pills are available without a prescription. Check with your pharmacist. Do not use emergency contraception as your only form of birth control. BARRIER METHODS   Female condom. This is a thin sheath (latex or rubber) that is worn over the penis during sexual intercourse. It can be used with spermicide to increase effectiveness.  Female condom. This is a soft, loose-fitting sheath that is put into the vagina before sexual intercourse.  Diaphragm. This is a soft, latex, dome-shaped barrier that must be fitted by a health care provider. It is inserted into the vagina, along with a spermicidal jelly. It is inserted before intercourse. The diaphragm should be left in the vagina for 6 to 8 hours after intercourse.  Cervical cap. This is a round, soft, latex or plastic cup that fits over the cervix and must be fitted by a health care provider. The cap can be left in place for up to 48 hours after intercourse.  Sponge. This is a soft, circular piece of polyurethane foam. The sponge has spermicide in it. It is inserted into the vagina after wetting it and  before sexual intercourse.  Spermicides. These are chemicals that kill or block sperm from entering the cervix and uterus. They come in the form of creams, jellies, suppositories, foam, or tablets. They do not require a prescription. They are inserted into the vagina with an applicator before having sexual intercourse.  The process must be repeated every time you have sexual intercourse. INTRAUTERINE CONTRACEPTION  Intrauterine device (IUD). This is a T-shaped device that is put in a woman's uterus during a menstrual period to prevent pregnancy. There are 2 types:  Copper IUD. This type of IUD is wrapped in copper wire and is placed inside the uterus. Copper makes the uterus and fallopian tubes produce a fluid that kills sperm. It can stay in place for 10 years.  Hormone IUD. This type of IUD contains the hormone progestin (synthetic progesterone). The hormone thickens the cervical mucus and prevents sperm from entering the uterus, and it also thins the uterine lining to prevent implantation of a fertilized egg. The hormone can weaken or kill the sperm that get into the uterus. It can stay in place for 3-5 years, depending on which type of IUD is used. PERMANENT METHODS OF CONTRACEPTION  Female tubal ligation. This is when the woman's fallopian tubes are surgically sealed, tied, or blocked to prevent the egg from traveling to the uterus.  Hysteroscopic sterilization. This involves placing a small coil or insert into each fallopian tube. Your doctor uses a technique called hysteroscopy to do the procedure. The device causes scar tissue to form. This results in permanent blockage of the fallopian tubes, so the sperm cannot fertilize the egg. It takes about 3 months after the procedure for the tubes to become blocked. You must use another form of birth control for these 3 months.  Female sterilization. This is when the female has the tubes that carry sperm tied off (vasectomy).This blocks sperm from entering the vagina during sexual intercourse. After the procedure, the man can still ejaculate fluid (semen). NATURAL PLANNING METHODS  Natural family planning. This is not having sexual intercourse or using a barrier method (condom, diaphragm, cervical cap) on days the woman could become pregnant.  Calendar method. This  is keeping track of the length of each menstrual cycle and identifying when you are fertile.  Ovulation method. This is avoiding sexual intercourse during ovulation.  Symptothermal method. This is avoiding sexual intercourse during ovulation, using a thermometer and ovulation symptoms.  Post-ovulation method. This is timing sexual intercourse after you have ovulated.  Oral Contraception Information Oral contraceptive pills (OCPs) are medicines taken to prevent pregnancy. OCPs work by preventing the ovaries from releasing eggs. The hormones in OCPs also cause the cervical mucus to thicken, preventing the sperm from entering the uterus. The hormones also cause the uterine lining to become thin, not allowing a fertilized egg to attach to the inside of the uterus. OCPs are highly effective when taken exactly as prescribed. However, OCPs do not prevent sexually transmitted diseases (STDs). Safe sex practices, such as using condoms along with the pill, can help prevent STDs.  Before taking the pill, you may have a physical exam and Pap test. Your health care provider may order blood tests. The health care provider will make sure you are a good candidate for oral contraception. Discuss with your health care provider the possible side effects of the OCP you may be prescribed. When starting an OCP, it can take 2 to 3 months for the body to adjust to the changes in hormone  levels in your body.  TYPES OF ORAL CONTRACEPTION  The combination pill--This pill contains estrogen and progestin (synthetic progesterone) hormones. The combination pill comes in 21-day, 28-day, or 91-day packs. Some types of combination pills are meant to be taken continuously (365-day pills). With 21-day packs, you do not take pills for 7 days after the last pill. With 28-day packs, the pill is taken every day. The last 7 pills are without hormones. Certain types of pills have more than 21 hormone-containing pills. With 91-day packs, the  first 84 pills contain both hormones, and the last 7 pills contain no hormones or contain estrogen only.  The minipill--This pill contains the progesterone hormone only. The pill is taken every day continuously. It is very important to take the pill at the same time each day. The minipill comes in packs of 28 pills. All 28 pills contain the hormone.  ADVANTAGES OF ORAL CONTRACEPTIVE PILLS  Decreases premenstrual symptoms.   Treats menstrual period cramps.   Regulates the menstrual cycle.   Decreases a heavy menstrual flow.   May treatacne, depending on the type of pill.   Treats abnormal uterine bleeding.   Treats polycystic ovarian syndrome.   Treats endometriosis.   Can be used as emergency contraception.  THINGS THAT CAN MAKE ORAL CONTRACEPTIVE PILLS LESS EFFECTIVE OCPs can be less effective if:   You forget to take the pill at the same time every day.   You have a stomach or intestinal disease that lessens the absorption of the pill.   You take OCPs with other medicines that make OCPs less effective, such as antibiotics, certain HIV medicines, and some seizure medicines.   You take expired OCPs.   You forget to restart the pill on day 7, when using the packs of 21 pills.  RISKS ASSOCIATED WITH ORAL CONTRACEPTIVE PILLS  Oral contraceptive pills can sometimes cause side effects, such as:  Headache.  Nausea.  Breast tenderness.  Irregular bleeding or spotting. Combination pills are also associated with a small increased risk of:  Blood clots.  Heart attack.  Stroke.   This information is not intended to replace advice given to you by your health care provider. Make sure you discuss any questions you have with your health care provider.   Document Released: 11/07/2002 Document Revised: 06/07/2013 Document Reviewed: 02/05/2013 Elsevier Interactive Patient Education [employer].

## 2016-04-23 NOTE — Assessment & Plan Note (Signed)
Patient is sexually active without use of contraception or condoms and does not wish to become pregnant. I discussed different contraceptive strategies with her today. She inquires first about the Depo shot as she is worried she would forget to take a daily birth control pill. I have also provided her with information on IUDs, etc. She will think about her options and discuss upon return. We should however, first figure out her bleeding disorders prior to starting her on birth control given the risk of clotting.

## 2016-04-23 NOTE — Assessment & Plan Note (Addendum)
Patient has a history of chronic microcytic anemia since 2012 with baseline hemoglobin of 10-12. She reports a light period with spotting last for 2 days 1.5 months ago. One month ago her urine pregnancy home test was positive. 2 weeks ago, patient developed cramping abdominal pain and vaginal bleeding and presented to the ED. Her hemoglobin at that time was 4.8 on 8/17. Anemia panel was collected which showed iron <5, TIBC 477, Folate 11 (normal), and Vitamin B12 normal at 438. She received 2 units of pRBCs with improvement of her hemoglobin to 7.7. Her hemoglobin today is 8.8, MCV 79.2. She was also noted to have thrombocythemia (new) at 850, 819 today which could be reactive. Pelvic and transvaginal ultrasound were normal. BMET was normal. Patient denies any history or family history of bleeding or clotting. She denies history of easy bruising or prolonged bleeding. She denies blood in her stool, urine, or sputum. She takes no daily medications. She normally has normal menstrual periods, once every month, lasting for 3-4 days, without menorrhagia. She is sexually active and does not use protection.   Assessment: Chronic Microcytic Anemia likely secondary to Iron Deficiency Anemia. However, given acute onset of severe anemia, could be 2/2 hemorrhage from miscarriage or an underlying blood disorder such as thalassemia.   Plan: -Smear review -Check Ferritin, Iron, TIBC -CBC has resulted (Hgb 8.8) -Continue iron supplementation -Follow up in one month  ADDENDUM: -Hemoglobin stable at 8.8, MCV 79.2. Iron 216, iron saturation 51, TIBC 424, Ferritin 8. Smear review shows target cells, large platelets, elliptocytes, and spherocytes.  -Microcytic Anemia may still be due to Iron Deficiency Anemia. However, given target cells on smear review, would consider thalassemia.  -Plan: Continue iron supplementation, return in one month, repeat CBC, ferritin, and reticulocyte count. If persistent despite iron  repleteion, consider protein electrophoresis for thalassemia.

## 2016-04-24 LAB — IRON AND TIBC
Iron Saturation: 51 % (ref 15–55)
Iron: 216 ug/dL — ABNORMAL HIGH (ref 27–159)
TIBC: 424 ug/dL (ref 250–450)
UIBC: 208 ug/dL (ref 131–425)

## 2016-04-24 LAB — SAVE SMEAR

## 2016-04-24 NOTE — Progress Notes (Signed)
Internal Medicine Clinic Attending  Case discussed with Dr. Burns at the time of the visit.  We reviewed the resident's history and exam and pertinent patient test results.  I agree with the assessment, diagnosis, and plan of care documented in the resident's note.  

## 2016-04-25 LAB — FERRITIN: FERRITIN: 8 ng/mL — AB (ref 15–150)

## 2016-04-26 ENCOUNTER — Emergency Department (HOSPITAL_COMMUNITY)
Admission: EM | Admit: 2016-04-26 | Discharge: 2016-04-26 | Disposition: A | Discharge status: Home / Self Care | Payer: Self-pay | Attending: Emergency Medicine | Admitting: Emergency Medicine

## 2016-04-26 ENCOUNTER — Encounter (HOSPITAL_COMMUNITY): Payer: Self-pay | Admitting: Emergency Medicine

## 2016-04-26 DIAGNOSIS — F17210 Nicotine dependence, cigarettes, uncomplicated: Secondary | ICD-10-CM | POA: Insufficient documentation

## 2016-04-26 DIAGNOSIS — J45909 Unspecified asthma, uncomplicated: Secondary | ICD-10-CM | POA: Insufficient documentation

## 2016-04-26 DIAGNOSIS — J029 Acute pharyngitis, unspecified: Secondary | ICD-10-CM | POA: Insufficient documentation

## 2016-04-26 LAB — RAPID STREP SCREEN (MED CTR MEBANE ONLY): Streptococcus, Group A Screen (Direct): NEGATIVE

## 2016-04-26 MED ORDER — IBUPROFEN 400 MG PO TABS
600.0000 mg | ORAL_TABLET | Freq: Once | ORAL | Status: AC
  Administered 2016-04-26: 600 mg via ORAL
  Filled 2016-04-26: qty 1

## 2016-04-26 NOTE — ED Notes (Signed)
Pt continues to be able to speak in full sentences and states breathing easier than she was earlier. Pt swallowed meds w/o difficulty.

## 2016-04-26 NOTE — ED Notes (Signed)
Pt sitting in chair. Continuous pulse ox noted to finger - sats 100% HR 78. States feels throat is feeling worse. Pt remains tearful and is asking for pain med. Advised will administer when ordered by provider - voiced understanding.

## 2016-04-26 NOTE — Discharge Instructions (Signed)
Please return to the emergency room immediately if he spends any new or worsening signs or symptoms. Please use Tylenol or ibuprofen as needed for discomfort, warm water and honey, follow-up with primary care if symptoms continue to persist.

## 2016-04-26 NOTE — ED Triage Notes (Signed)
Pt states she woke up this am with right sided throat pain and difficulty swallowing. Pts voice is clear airway intact.

## 2016-04-26 NOTE — ED Provider Notes (Signed)
[facility] DEPT Provider Note   CSN: [id number] Arrival date & time: 04/26/16  1003  By signing my name below, I, Judithe Modest, attest that this documentation has been prepared under the direction and in the presence of [employer], PA-C. Electronically Signed: Judithe Modest, ER Scribe. 04/11/2016. 10:59 AM.   History   Chief Complaint Chief Complaint  Patient presents with  . Sore Throat    The history is provided by the patient. No language interpreter was used.    HPI Comments: Stacy Warner is a 20 y.o. female who presents to the Emergency Department complaining of right sided throat pain. She states her throat feels like it is closing on the right side. She is having difficulty breathing through her mouth, but is able to breath normally through her nose. She has a past surgical hx of tonsillectomy. She woke up with her throat pain. She denies fever, rash, rhinorrhea or chills. She has [place]. She woke up with her sx. Her swallowing and breathing has improved since she arrived in the ED.   Past Medical History:  Diagnosis Date  . Anemia   . Asthma   . Chlamydia    age 59  . Infection    UTI    Patient Active Problem List   Diagnosis Date Noted  . Encounter for immunization 04/23/2016  . Tobacco abuse 04/23/2016  . Marijuana abuse 04/23/2016  . Contraceptive education 04/23/2016  . Iron deficiency anemia 04/18/2016    Class: Acute  . Essential thrombocythemia ([place]) 04/18/2016    Class: Acute    Past Surgical History:  Procedure Laterality Date  . TONSILLECTOMY      OB History    Gravida Para Term Preterm AB Living   0             SAB TAB Ectopic Multiple Live Births                   Home Medications    Prior to Admission medications   Medication Sig Start Date End Date Taking? Authorizing Provider  ferrous gluconate (FERGON) 324 MG tablet Take 1 tablet (324 mg total) by mouth daily with breakfast. 04/19/16   Chancy Milroy, MD     Family History Family History  Problem Relation Age of Onset  . Heart disease Paternal Grandmother   . Stroke Paternal Grandmother     Social History Social History  Substance Use Topics  . Smoking status: Current Every Day Smoker    Packs/day: 0.25    Years: 1.00    Types: Cigars  . Smokeless tobacco: Never Used     Comment: black and mild  . Alcohol use No     Comment: occasional     Allergies   Review of patient's allergies indicates no known allergies.   Review of Systems Review of Systems  Constitutional: Negative for chills and fever.  HENT: Positive for sore throat. Negative for congestion and rhinorrhea.   Respiratory: Negative for cough and shortness of breath.   Skin: Negative for rash.    Physical Exam Updated Vital Signs BP 115/84 (BP Location: Right Arm)   Pulse 76   Temp 98.8 F (37.1 C) (Oral)   Resp 18   Ht 5\' 5"  (1.651 m)   Wt 48.5 kg   LMP 02/29/2016 (Approximate)   SpO2 100%   BMI 17.81 kg/m   Physical Exam  Constitutional: She is oriented to person, place, and time. She appears well-developed and well-nourished.  No distress.  HENT:  Head: Normocephalic and atraumatic.  Nose: Nose normal.  Mouth/Throat: Oropharynx is clear and moist. No oropharyngeal exudate.  No retropharyngeal or pari tonsillar abscess.   Eyes: Pupils are equal, round, and reactive to light.  Neck: Neck supple. No thyromegaly present.  Right anterior cervical adenopathy. Left was palpable but nontender.  Cardiovascular: Normal rate, regular rhythm and normal heart sounds.   No murmur heard. Pulmonary/Chest: Effort normal and breath sounds normal. No respiratory distress. She has no wheezes.  Musculoskeletal: Normal range of motion.  Lymphadenopathy:    She has cervical adenopathy.  Neurological: She is alert and oriented to person, place, and time. Coordination normal.  Skin: Skin is warm and dry. No rash noted. She is not diaphoretic.  Psychiatric: She has a  normal mood and affect. Her behavior is normal.  Nursing note and vitals reviewed.    ED Treatments / Results  Labs (all labs ordered are listed, but only abnormal results are displayed) Labs Reviewed  RAPID STREP SCREEN (NOT AT [facility])  CULTURE, GROUP A STREP [facility])    EKG  EKG Interpretation None       Radiology No results found.  Procedures Procedures (including critical care time)  Medications Ordered in ED Medications  ibuprofen (ADVIL,MOTRIN) tablet 600 mg (not administered)     Initial Impression / Assessment and Plan / ED Course  I have reviewed the triage vital signs and the nursing notes.  Pertinent labs & imaging results that were available during my care of the patient were reviewed by me and considered in my medical decision making (see chart for details).  Clinical Course     Final Clinical Impressions(s) / ED Diagnoses   Final diagnoses:  Sore throat    Labs:  Imaging:  Consults:  Therapeutics:  Discharge Meds:   Assessment/Plan:  Pt presents with sore throat. No difficulty swallowing, drooling, dysphonia,muffled voice, stridor, swelling of the neck, trismus, mouth pain, swelling/ pain in submandibular area or floor of mouth ,assymetry of tonsils, or ulcerations; unlikely epiglottitis, PTA, submandibular space infection, retropharyngeal space infection, or HIV. Pt treated here in the ED with therapeutics listed above, given strict return precautions, PCP follow-up for re-evaluation if symptoms persist beyond 5-7 days in duration, return to the ED if they worsen. Pt verbalized understanding and agreement to today's plan and had no further questions or concerns at the time of discharge.    New Prescriptions New Prescriptions   No medications on file      I personally performed the services described in this documentation, which was scribed in my presence. The recorded information has been reviewed and is accurate.       Okey Regal, PA-C 04/26/16 [place], MD 04/26/16 [id number]

## 2016-04-27 ENCOUNTER — Encounter: Payer: Self-pay | Admitting: Internal Medicine

## 2016-04-28 LAB — CULTURE, GROUP A STREP (THRC)

## 2016-04-30 ENCOUNTER — Encounter (HOSPITAL_COMMUNITY): Payer: Self-pay | Admitting: *Deleted

## 2016-04-30 ENCOUNTER — Emergency Department (HOSPITAL_COMMUNITY)
Admission: EM | Admit: 2016-04-30 | Discharge: 2016-04-30 | Disposition: A | Discharge status: Home / Self Care | Payer: Self-pay | Attending: Emergency Medicine | Admitting: Emergency Medicine

## 2016-04-30 ENCOUNTER — Encounter (HOSPITAL_COMMUNITY): Payer: Self-pay | Admitting: Emergency Medicine

## 2016-04-30 ENCOUNTER — Emergency Department (HOSPITAL_COMMUNITY)
Admission: EM | Admit: 2016-04-30 | Discharge: 2016-04-30 | Disposition: A | Discharge status: Home / Self Care | Payer: Self-pay | Attending: Dermatology | Admitting: Dermatology

## 2016-04-30 DIAGNOSIS — J45909 Unspecified asthma, uncomplicated: Secondary | ICD-10-CM | POA: Insufficient documentation

## 2016-04-30 DIAGNOSIS — K0889 Other specified disorders of teeth and supporting structures: Secondary | ICD-10-CM | POA: Insufficient documentation

## 2016-04-30 DIAGNOSIS — F17210 Nicotine dependence, cigarettes, uncomplicated: Secondary | ICD-10-CM | POA: Insufficient documentation

## 2016-04-30 DIAGNOSIS — Z5321 Procedure and treatment not carried out due to patient leaving prior to being seen by health care provider: Secondary | ICD-10-CM | POA: Insufficient documentation

## 2016-04-30 DIAGNOSIS — J029 Acute pharyngitis, unspecified: Secondary | ICD-10-CM | POA: Insufficient documentation

## 2016-04-30 MED ORDER — PENICILLIN V POTASSIUM 500 MG PO TABS: 500.0000 mg | ORAL_TABLET | Freq: Four times a day (QID) | ORAL | 0 refills | Status: DC

## 2016-04-30 MED ORDER — NAPROXEN 250 MG PO TABS: 250.0000 mg | ORAL_TABLET | Freq: Two times a day (BID) | ORAL | 0 refills | Status: DC

## 2016-04-30 NOTE — ED Provider Notes (Signed)
[facility] DEPT Provider Note   CSN: [id number] Arrival date & time: 04/30/16  [id number]     History   Chief Complaint Chief Complaint  Patient presents with  . Dental Pain    HPI Stacy Warner is a 20 y.o. female.  Stacy Warner is a 20 y.o. female who presents to the ED complaining of right lower dental pain for the past week. Patient reports she believes she has a cracked right lower molar that is causing her to have pain. She reports taking Tylenol with some relief of her pain. She denies any fevers or discharge from mouth. She does report that she has some pain with swallowing. She was seen recently and had a negative strep test. Patient denies fevers, neck pain, trouble swallowing, drooling, discharge from her mouth, abdominal pain, vomiting, ear pain, or rashes.   The history is provided by the patient. No language interpreter was used.  Dental Pain      Past Medical History:  Diagnosis Date  . Anemia   . Asthma   . Chlamydia    age 7  . Infection    UTI    Patient Active Problem List   Diagnosis Date Noted  . Encounter for immunization 04/23/2016  . Tobacco abuse 04/23/2016  . Marijuana abuse 04/23/2016  . Contraceptive education 04/23/2016  . Iron deficiency anemia 04/18/2016    Class: Acute  . 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 TONSILLECTOMY      OB History    Gravida Para Term Preterm AB Living   0             SAB TAB Ectopic Multiple Live Births                   Home Medications    Prior to Admission medications   Medication Sig Start Date End Date Taking? Authorizing Provider  ferrous gluconate (FERGON) 324 MG tablet Take 1 tablet (324 mg total) by mouth daily with breakfast. 04/19/16   Chancy Milroy, MD  naproxen (NAPROSYN) 250 MG tablet Take 1 tablet (250 mg total) by mouth 2 (two) times daily with a meal. 04/30/16   Waynetta Pean, PA-C  penicillin v  potassium (VEETID) 500 MG tablet Take 1 tablet (500 mg total) by mouth 4 (four) times daily. 04/30/16   Waynetta Pean, PA-C    Family History Family History  Problem Relation Age of Onset  . Heart disease Paternal Grandmother   . Stroke Paternal Grandmother     Social History Social History  Substance Use Topics  . Smoking status: Current Every Day Smoker    Packs/day: 0.25    Years: 1.00    Types: Cigars  . Smokeless tobacco: Never Used     Comment: black and mild  . Alcohol use No     Comment: occasional     Allergies   Review of patient's allergies indicates no known allergies.   Review of Systems Review of Systems  Constitutional: Negative for chills and fever.  HENT: Positive for dental problem. Negative for drooling, ear pain, facial swelling, sore throat and trouble swallowing.   Eyes: Negative for pain and visual disturbance.  Musculoskeletal: Negative for neck pain and neck stiffness.  Skin: Negative for rash.  Neurological: Negative for headaches.     Physical Exam Updated Vital Signs BP 126/86 (BP Location: Right Arm)   Pulse 73  Temp 98.7 F (37.1 C) (Oral)   Resp 18   Ht 5\' 5"  (1.651 m)   Wt 49.9 kg   LMP 04/17/2016   SpO2 100%   BMI 18.30 kg/m   Physical Exam  Constitutional: She is oriented to person, place, and time. She appears well-developed and well-nourished. No distress.  Non-toxic appearing.   HENT:  Head: Normocephalic and atraumatic.  Right Ear: External ear normal.  Left Ear: External ear normal.  Mouth/Throat: Oropharynx is clear and moist. No oropharyngeal exudate.  Tenderness to right lower molar.  No discharge from the mouth. No facial swelling.  Uvula is midline without edema. Soft palate rises symmetrically. No tonsillar hypertrophy or exudates. Tongue protrusion is normal. No trismus.    Eyes: Conjunctivae are normal. Pupils are equal, round, and reactive to light. Right eye exhibits no discharge. Left eye exhibits no  discharge.  Neck: Normal range of motion. Neck supple. No JVD present. No tracheal deviation present.  Cardiovascular: Normal rate, regular rhythm, normal heart sounds and intact distal pulses.   Pulmonary/Chest: Effort normal and breath sounds normal. No respiratory distress.  Abdominal: Soft. There is no tenderness.  Lymphadenopathy:    She has no cervical adenopathy.  Neurological: She is alert and oriented to person, place, and time. No cranial nerve deficit. Coordination normal.  Skin: Capillary refill takes less than 2 seconds. No rash noted. She is not diaphoretic.  Psychiatric: She has a normal mood and affect. Her behavior is normal.  Nursing note and vitals reviewed.    ED Treatments / Results  Labs (all labs ordered are listed, but only abnormal results are displayed) Labs Reviewed - No data to display  EKG  EKG Interpretation None       Radiology No results found.  Procedures Procedures (including critical care time)  Medications Ordered in ED Medications - No data to display   Initial Impression / Assessment and Plan / ED Course  I have reviewed the triage vital signs and the nursing notes.  Pertinent labs & imaging results that were available during my care of the patient were reviewed by me and considered in my medical decision making (see chart for details).  Clinical Course   Patient presents complaining of right lower dental pain for the past week. Patient with toothache.  No gross abscess.  Exam unconcerning for Ludwig's angina or spread of infection.  Will treat with penicillin and pain medicine.  Urged patient to follow-up with dentist Dr. Maurilio Lovely. I advised the patient to follow-up with their primary care provider this week. I advised the patient to return to the emergency department with new or worsening symptoms or new concerns. The patient verbalized understanding and agreement with plan.     Final Clinical Impressions(s) / ED Diagnoses    Final diagnoses:  Pain, dental    New Prescriptions New Prescriptions   NAPROXEN (NAPROSYN) 250 MG TABLET    Take 1 tablet (250 mg total) by mouth 2 (two) times daily with a meal.   PENICILLIN V POTASSIUM (VEETID) 500 MG TABLET    Take 1 tablet (500 mg total) by mouth 4 (four) times daily.     Waynetta Pean, PA-C 04/30/16 [id number]    Orpah Greek, MD 04/30/16 [phone]

## 2016-04-30 NOTE — ED Triage Notes (Signed)
Pt in reports dental pain to R lower tooth, causing mouth to swell and HA.

## 2016-04-30 NOTE — ED Triage Notes (Signed)
Pt c/o sore throat and possible dental abscess to R side of mouth x 1 week. Denies shortness of breath.

## 2016-05-20 ENCOUNTER — Telehealth: Payer: Self-pay | Admitting: General Practice

## 2016-05-20 NOTE — Telephone Encounter (Signed)
AT. Allison Quarry CALL, NO ANSWER, NO VOICEMAIL

## 2016-05-21 ENCOUNTER — Encounter: Payer: Self-pay | Admitting: Internal Medicine

## 2016-05-21 ENCOUNTER — Ambulatory Visit (INDEPENDENT_AMBULATORY_CARE_PROVIDER_SITE_OTHER): Payer: Self-pay | Admitting: Internal Medicine

## 2016-05-21 VITALS — BP 105/67 | HR 69 | Temp 97.9°F | Ht 65.0 in | Wt 104.7 lb

## 2016-05-21 DIAGNOSIS — D500 Iron deficiency anemia secondary to blood loss (chronic): Secondary | ICD-10-CM

## 2016-05-21 DIAGNOSIS — D473 Essential (hemorrhagic) thrombocythemia: Secondary | ICD-10-CM

## 2016-05-21 DIAGNOSIS — D75839 Thrombocytosis, unspecified: Secondary | ICD-10-CM

## 2016-05-21 DIAGNOSIS — N920 Excessive and frequent menstruation with regular cycle: Secondary | ICD-10-CM

## 2016-05-21 DIAGNOSIS — D509 Iron deficiency anemia, unspecified: Secondary | ICD-10-CM

## 2016-05-21 NOTE — Assessment & Plan Note (Signed)
She has history of heavy periods accompanied with clotting. She was recently admitted to the hospital because of that and her hemoglobin dropped to 4.8. She was given 2 units of packed cells at that time. Her current hemoglobin is 8.8. And she is using her iron supplement regularly.  Refer her to gynecologist for further evaluation and management of her menorrhagia.

## 2016-05-21 NOTE — Progress Notes (Signed)
   CC: Follow-up on her previous blood workup.  HPI:  Stacy Warner is a 20 y.o. with past medical history as listed below, came to the clinic to find out the results of her previous blood workup. She has no complaints today. She has an history of menorrhagia, iron deficient anemia, she was admitted in the hospital in August 2017 after having heavy bleeding during her period and her hemoglobin was found to be at 4.8 she was given 2 units of packed red blood cells. During her admission she was found to have thrombocytosis with platelet counts of 850. Her previous labs were drawn to follow-up on her thrombocytosis, her platelet count is still high at 819. In her perifrontal blood smear is positive for polychromasia, target cells, lymphocytes, and some spherocytes. Her hemoglobin was 8.8 during that test result. She complained of having some muscular weakness especially around her shoulders and some headaches during her period. She states that her periods are very heavy [place] the she during first 2-3 days. Her last period was on September 18. She is a smoker using one cigar a day. She also uses marijuana on a regular basis almost every day. She denies any alcohol abuse.  Past Medical History:  Diagnosis Date  . Anemia   . Asthma   . Chlamydia    age 60  . Infection    UTI    Review of Systems:  AS per HPI.  Physical Exam:  Vitals:   05/21/16 0947  BP: 105/67  Pulse: 69  Temp: 97.9 F (36.6 C)  TempSrc: Oral  SpO2: 100%  Weight: 104 lb 11.2 oz (47.5 kg)  Height: 5\' 5"  (1.651 m)   Vitals:   05/21/16 0947  BP: 105/67  Pulse: 69  Temp: 97.9 F (36.6 C)  TempSrc: Oral  SpO2: 100%  Weight: 104 lb 11.2 oz (47.5 kg)  Height: 5\' 5"  (1.651 m)   General: Vital signs reviewed.  Patient is well-developed and well-nourished, in no acute distress and cooperative with exam.  Head: Normocephalic and atraumatic. Eyes: EOMI, conjunctivae normal, no scleral icterus.  Neck: Supple,  trachea midline, normal ROM, no JVD, masses, thyromegaly, or carotid bruit present.  Cardiovascular: RRR, S1 normal, S2 normal, no murmurs, gallops, or rubs. Pulmonary/Chest: Clear to auscultation bilaterally, no wheezes, rales, or rhonchi. Abdominal: Soft, non-tender, non-distended, BS +, no masses, organomegaly, or guarding present.  Musculoskeletal: No joint deformities, erythema, or stiffness, ROM full and nontender. Extremities: No lower extremity edema bilaterally,  pulses symmetric and intact bilaterally. No cyanosis or clubbing. Neurological: A&O x3, Strength is normal and symmetric bilaterally, cranial nerve II-XII are grossly intact, no focal motor deficit, sensory intact to light touch bilaterally.  Skin: Warm, dry and intact. No rashes or erythema. Psychiatric: Normal mood and affect. speech and behavior is normal. Cognition and memory are normal.   Assessment & Plan:   See Encounters Tab for problem based charting.  Patient seen with Dr. Dareen Piano.

## 2016-05-21 NOTE — Assessment & Plan Note (Addendum)
She had iron deficiency anemia due to heavy periods. Her current hemoglobin was 8.8 with low ferritin and normal iron, most probably due to her recent blood transfusion. She uses her iron supplement regularly.  We'll refer her to a gynecologist for further evaluation and management of her heavy periods. Continue using iron supplement. If she has persistent anemia, we can consider workup for thalassemia, although she don't have any family history.  Her recent Iron studies are Iron/TIBC/Ferritin/ %Sat    Component Value Date/Time   IRON 18 (L) 05/21/2016 1106   TIBC 392 05/21/2016 1106   FERRITIN 20 05/21/2016 1106   IRONPCTSAT 5 (LL) 05/21/2016 1106   -Increase iron supplement from 1 pill to 2 pills. -She should Get a gynecological evaluation for her menorrhagia.

## 2016-05-21 NOTE — Assessment & Plan Note (Addendum)
She was found to have a platelet count of 850 during her acute visit and hospital because of heavy  bleeding during her periods. Her platelet count decreased to 819 Subsequent Testing. her perifrontal blood smear is positive for polychromasia, target cells, lymphocytes, and some spherocytes. Her hemoglobin was 8.8 during that test result.  Her exam was negative for any organomegaly.  It might be due to reactive thrombocytosis due to acute blood loss. She has no family history of thalassemia, but she do have this chronic anemia. Hemoglobin stable between 11-12.  We will repeat her CBC, iron studies, ferritin level, reticulocyte count and  blood smear. If she has persistent thrombocytosis or persistent abnormalities on her blood smear, we will refer her to a hematologist for further evaluation and management.  Addendum. CBC Latest Ref Rng & Units 05/21/2016 04/23/2016 04/19/2016  WBC 3.4 - 10.8 x10E3/uL 7.2 9.7 -  Hemoglobin 12.0 - 15.0 g/dL - 8.8(L) 7.7(L)  Hematocrit 34.0 - 46.6 % 35.7 30.4(L) 24.2(L)  Platelets 150 - 379 x10E3/uL 630(H) 819(H) -   Her platelets are trending down. Most probably due to reactive thrombocytosis. We will repeat in one month.

## 2016-05-21 NOTE — Patient Instructions (Addendum)
Thank you for coming to the clinic today. We will repeat your blood workup and call you with any abnormal results. Keep taking her iron supplement regularly. I'm referring to you for a GYN evaluation, I think you will benefit from oral contraceptive pills. A full found any abnormality in your blood results we will call you and bring to back in the clinic for further evaluation.

## 2016-05-22 ENCOUNTER — Telehealth: Payer: Self-pay | Admitting: Internal Medicine

## 2016-05-22 LAB — CBC WITH DIFFERENTIAL/PLATELET
BASOS: 1 %
Basophils Absolute: 0.1 10*3/uL (ref 0.0–0.2)
EOS (ABSOLUTE): 0.2 10*3/uL (ref 0.0–0.4)
Eos: 2 %
HEMATOCRIT: 35.7 % (ref 34.0–46.6)
HEMOGLOBIN: 10.5 g/dL — AB (ref 11.1–15.9)
IMMATURE GRANULOCYTES: 0 %
Immature Grans (Abs): 0 10*3/uL (ref 0.0–0.1)
LYMPHS ABS: 2.7 10*3/uL (ref 0.7–3.1)
Lymphs: 37 %
MCH: 23.9 pg — AB (ref 26.6–33.0)
MCHC: 29.4 g/dL — AB (ref 31.5–35.7)
MCV: 81 fL (ref 79–97)
MONOS ABS: 0.6 10*3/uL (ref 0.1–0.9)
Monocytes: 9 %
NEUTROS PCT: 51 %
Neutrophils Absolute: 3.6 10*3/uL (ref 1.4–7.0)
Platelets: 630 10*3/uL — ABNORMAL HIGH (ref 150–379)
RBC: 4.39 x10E6/uL (ref 3.77–5.28)
RDW: 22 % — AB (ref 12.3–15.4)
WBC: 7.2 10*3/uL (ref 3.4–10.8)

## 2016-05-22 LAB — FERRITIN: Ferritin: 20 ng/mL (ref 15–150)

## 2016-05-22 LAB — IRON AND TIBC
IRON: 18 ug/dL — AB (ref 27–159)
Iron Saturation: 5 % — CL (ref 15–55)
TIBC: 392 ug/dL (ref 250–450)
UIBC: 374 ug/dL (ref 131–425)

## 2016-05-22 LAB — RETICULOCYTES: RETIC CT PCT: 0.7 % (ref 0.6–2.6)

## 2016-05-22 NOTE — Telephone Encounter (Signed)
I called the patient and told her about her recent blood workup results. I advised her to double up her iron dose. Told her that she should go for her GYN evaluation as soon as possible. We will repeat her blood workup in one month.

## 2016-05-24 LAB — PATHOLOGIST SMEAR REVIEW: Path Rev WBC: NORMAL

## 2016-05-27 NOTE — Progress Notes (Signed)
Internal Medicine Clinic Attending  I saw and evaluated the patient.  I personally confirmed the key portions of the history and exam documented by Dr. Amin and I reviewed pertinent patient test results.  The assessment, diagnosis, and plan were formulated together and I agree with the documentation in the resident's note. 

## 2016-06-18 ENCOUNTER — Encounter (HOSPITAL_COMMUNITY): Payer: Self-pay | Admitting: Emergency Medicine

## 2016-06-18 ENCOUNTER — Emergency Department (HOSPITAL_COMMUNITY)
Admission: EM | Admit: 2016-06-18 | Discharge: 2016-06-18 | Disposition: A | Discharge status: Home / Self Care | Payer: Self-pay | Attending: Physician Assistant | Admitting: Physician Assistant

## 2016-06-18 DIAGNOSIS — K0889 Other specified disorders of teeth and supporting structures: Secondary | ICD-10-CM | POA: Insufficient documentation

## 2016-06-18 DIAGNOSIS — J45909 Unspecified asthma, uncomplicated: Secondary | ICD-10-CM | POA: Insufficient documentation

## 2016-06-18 DIAGNOSIS — F17290 Nicotine dependence, other tobacco product, uncomplicated: Secondary | ICD-10-CM | POA: Insufficient documentation

## 2016-06-18 MED ORDER — PENICILLIN V POTASSIUM 500 MG PO TABS: 500.0000 mg | ORAL_TABLET | Freq: Four times a day (QID) | ORAL | 0 refills | Status: DC

## 2016-06-18 NOTE — ED Provider Notes (Signed)
[facility] DEPT Provider Note   CSN: [id number] Arrival date & time: 06/18/16  [id number]     History   Chief Complaint Chief Complaint  Patient presents with  . Dental Pain    HPI Stacy Warner is a 20 y.o. female.  Patient presents to the emergency department with a dental complaint. Symptoms began several days ago. The patient has tried to alleviate pain with tylenol and ibuprofen.  Pain rated at a 10/10, characterized as throbbing in nature and located left lower rear molar. Patient denies fever, night sweats, chills, difficulty swallowing or opening mouth, SOB, nuchal rigidity or decreased ROM of neck.  Patient does not have a dentist and requests a resource guide at discharge.    The history is provided by the patient. No language interpreter was used.    Past Medical History:  Diagnosis Date  . Anemia   . Asthma   . Chlamydia    age 26  . Infection    UTI    Patient Active Problem List   Diagnosis Date Noted  . Menorrhagia 05/21/2016  . Encounter for immunization 04/23/2016  . Tobacco abuse 04/23/2016  . Marijuana abuse 04/23/2016  . Contraceptive education 04/23/2016  . Iron deficiency anemia 04/18/2016    Class: Acute  . Thrombocytosis ([place]) 04/18/2016    Class: Acute    Past Surgical History:  Procedure Laterality Date  . TONSILLECTOMY      OB History    Gravida Para Term Preterm AB Living   0             SAB TAB Ectopic Multiple Live Births                   Home Medications    Prior to Admission medications   Medication Sig Start Date End Date Taking? Authorizing Provider  ferrous gluconate (FERGON) 324 MG tablet Take 1 tablet (324 mg total) by mouth daily with breakfast. 04/19/16   Chancy Milroy, MD  naproxen (NAPROSYN) 250 MG tablet Take 1 tablet (250 mg total) by mouth 2 (two) times daily with a meal. 04/30/16   Waynetta Pean, PA-C  penicillin v potassium (VEETID) 500 MG tablet Take 1 tablet (500 mg total) by mouth 4 (four) times  daily. 06/18/16   Montine Circle, PA-C    Family History Family History  Problem Relation Age of Onset  . Heart disease Paternal Grandmother   . Stroke Paternal Grandmother     Social History Social History  Substance Use Topics  . Smoking status: Current Every Day Smoker    Packs/day: 0.25    Years: 1.00    Types: Cigars  . Smokeless tobacco: Never Used     Comment: black and mild  . Alcohol use No     Comment: occasional     Allergies   Review of patient's allergies indicates no known allergies.   Review of Systems Review of Systems  Constitutional: Negative for chills and fever.  HENT: Positive for dental problem. Negative for drooling.   Neurological: Negative for speech difficulty.  Psychiatric/Behavioral: Positive for sleep disturbance.     Physical Exam Updated Vital Signs BP 130/91 (BP Location: Right Arm)   Pulse 73   Temp 97.7 F (36.5 C) (Oral)   Resp 14   Ht 5\' 5"  (1.651 m)   Wt 49.9 kg   LMP 06/12/2016   SpO2 100%   BMI 18.30 kg/m   Physical Exam Physical Exam  Constitutional: Pt appears well-developed and  well-nourished.  HENT:  Head: Normocephalic.  Right Ear: Tympanic membrane, external ear and ear canal normal.  Left Ear: Tympanic membrane, external ear and ear canal normal.  Nose: Nose normal. Right sinus exhibits no maxillary sinus tenderness and no frontal sinus tenderness. Left sinus exhibits no maxillary sinus tenderness and no frontal sinus tenderness.  Mouth/Throat: Uvula is midline, oropharynx is clear and moist and mucous membranes are normal. No oral lesions. No uvula swelling or lacerations. No oropharyngeal exudate, posterior oropharyngeal edema, posterior oropharyngeal erythema or tonsillar abscesses.  Poor dentition No gingival swelling, fluctuance or induration No gross abscess  No sublingual edema, tenderness to palpation, or sign of Ludwig's angina, or deep space infection Pain at left lower rear molar Eyes:  Conjunctivae are normal. Pupils are equal, round, and reactive to light. Right eye exhibits no discharge. Left eye exhibits no discharge.  Neck: Normal range of motion. Neck supple.  No stridor Handling secretions without difficulty No nuchal rigidity No cervical lymphadenopathy Cardiovascular: Normal rate, regular rhythm and normal heart sounds.   Pulmonary/Chest: Effort normal. No respiratory distress.  Equal chest rise  Abdominal: Soft. Bowel sounds are normal. Pt exhibits no distension. There is no tenderness.  Lymphadenopathy: Pt has no cervical adenopathy.  Neurological: Pt is alert and oriented x 4  Skin: Skin is warm and dry.  Psychiatric: Pt has a normal mood and affect.  Nursing note and vitals reviewed.    ED Treatments / Results  Labs (all labs ordered are listed, but only abnormal results are displayed) Labs Reviewed - No data to display  EKG  EKG Interpretation None       Radiology No results found.  Procedures Procedures (including critical care time)  Medications Ordered in ED Medications - No data to display   Initial Impression / Assessment and Plan / ED Course  I have reviewed the triage vital signs and the nursing notes.  Pertinent labs & imaging results that were available during my care of the patient were reviewed by me and considered in my medical decision making (see chart for details).  Clinical Course    Patient with dentalgia.  No abscess requiring immediate incision and drainage.  Exam not concerning for Ludwig's angina or pharyngeal abscess.  Will treat with penicillin. Pt instructed to follow-up with dentist.  Discussed return precautions. Pt safe for discharge.   Final Clinical Impressions(s) / ED Diagnoses   Final diagnoses:  Pain, dental    New Prescriptions Current Discharge Medication List       Montine Circle, PA-C 06/18/16 [place], MD 06/30/16 2332

## 2016-06-18 NOTE — ED Triage Notes (Signed)
Pt. reports left lower molar pain this week with extensive tooth decay / dental caries unrelieved by OTC pain medications.

## 2016-07-03 ENCOUNTER — Encounter: Payer: Self-pay | Admitting: Family Medicine

## 2016-07-09 NOTE — Addendum Note (Signed)
Addended by: Hulan Fray on: 07/09/2016 07:46 PM   Modules accepted: Orders

## 2016-09-24 ENCOUNTER — Ambulatory Visit (INDEPENDENT_AMBULATORY_CARE_PROVIDER_SITE_OTHER): Payer: Managed Care, Other (non HMO) | Admitting: Family Medicine

## 2016-09-24 ENCOUNTER — Encounter: Payer: Self-pay | Admitting: Family Medicine

## 2016-09-24 VITALS — BP 110/70 | HR 73 | Ht 65.75 in | Wt 110.0 lb

## 2016-09-24 DIAGNOSIS — R519 Headache, unspecified: Secondary | ICD-10-CM

## 2016-09-24 DIAGNOSIS — D473 Essential (hemorrhagic) thrombocythemia: Secondary | ICD-10-CM

## 2016-09-24 DIAGNOSIS — R51 Headache: Secondary | ICD-10-CM

## 2016-09-24 DIAGNOSIS — D509 Iron deficiency anemia, unspecified: Secondary | ICD-10-CM | POA: Diagnosis not present

## 2016-09-24 DIAGNOSIS — D75839 Thrombocytosis, unspecified: Secondary | ICD-10-CM

## 2016-09-24 LAB — COMPREHENSIVE METABOLIC PANEL
ALT: 9 U/L (ref 6–29)
AST: 17 U/L (ref 10–30)
Albumin: 4.1 g/dL (ref 3.6–5.1)
Alkaline Phosphatase: 68 U/L (ref 33–115)
BILIRUBIN TOTAL: 0.3 mg/dL (ref 0.2–1.2)
BUN: 12 mg/dL (ref 7–25)
CALCIUM: 9.9 mg/dL (ref 8.6–10.2)
CO2: 26 mmol/L (ref 20–31)
Chloride: 106 mmol/L (ref 98–110)
Creat: 0.76 mg/dL (ref 0.50–1.10)
GLUCOSE: 84 mg/dL (ref 65–99)
Potassium: 4.8 mmol/L (ref 3.5–5.3)
Sodium: 139 mmol/L (ref 135–146)
Total Protein: 7.1 g/dL (ref 6.1–8.1)

## 2016-09-24 LAB — CBC WITH DIFFERENTIAL/PLATELET
Basophils Absolute: 68 cells/uL (ref 0–200)
Basophils Relative: 1 %
EOS PCT: 3 %
Eosinophils Absolute: 204 cells/uL (ref 15–500)
HEMATOCRIT: 43.7 % (ref 35.0–45.0)
Hemoglobin: 14.3 g/dL (ref 11.7–15.5)
LYMPHS PCT: 36 %
Lymphs Abs: 2448 cells/uL (ref 850–3900)
MCH: 30.4 pg (ref 27.0–33.0)
MCHC: 32.7 g/dL (ref 32.0–36.0)
MCV: 93 fL (ref 80.0–100.0)
MPV: 9.4 fL (ref 7.5–12.5)
Monocytes Absolute: 612 cells/uL (ref 200–950)
Monocytes Relative: 9 %
NEUTROS ABS: 3468 {cells}/uL (ref 1500–7800)
NEUTROS PCT: 51 %
Platelets: 282 10*3/uL (ref 140–400)
RBC: 4.7 MIL/uL (ref 3.80–5.10)
RDW: 15 % (ref 11.0–15.0)
WBC: 6.8 10*3/uL (ref 4.0–10.5)

## 2016-09-24 LAB — TSH: TSH: 0.96 mIU/L

## 2016-09-24 NOTE — Progress Notes (Signed)
Subjective:    Patient ID: Stacy Warner, female    DOB: 1996/01/14, 21 y.o.   MRN: [id number]  HPI Chief Complaint  Patient presents with  . new pt    get established. migraines for the last couple days but went away this morning. wants hemoglobin checked as she had had iron tranfusion august 2017.    She is here to establish care and has multiple concerns.  Previous medical care: no PCP in the past. States she just got health insurance.  Most recently followed by [facility] internal medicine. They were working her up for anemia.   History of menorrhagia and IDA. States she had to have a blood transfusion in August 2017. Was taking an iron supplement and stopped in November 2017 because she ran out.   LMP: 09/12/2015.  States periods are always heavy and she bleeds for 4-5 days.   Has taken Depo injections in the past 2011-2014. She stopped those because she read about side effects but she was not having any problems from it.  States she has never seen a OB/GYN.  STI history, 2011- gonorrhea.  Has one female partner now. Uses condoms.  Never been pregnant.  Never had a pap smear.   Complains of a migraine headache for the 3 days this week but it went away last night. States she started getting headaches in the past year. States she gets 2 headaches per week. Lasts 12-72 hours. States pain is always on the left side of her head. Pain is like a throbbing sensation that is constant. Reports associated symptoms of photosensitivity and phonophobia.  No aura. Denies nausea, vomiting.  No blurred or double vision. No numbness, tingling, or weakness.  States her neck and shoulders are always tight also.  Unknown triggers. She thought at one point they were more frequent with her periods.  Takes Exedrin and Ibuprofen and usually helps but does not make it completely go away. Sometimes she uses peppermint tea.   Denies fever, chills, fatigue, dizziness, chest pain, palpitations, shortness  of breath, abdominal pain, GI or GU symptoms.    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  Last Eye Exam: has contact lenses  She got her flu shot.    Reviewed allergies, medications, past medical, surgical, family, and social history.    Review of Systems Pertinent positives and negatives in the history of present illness.     Objective:   Physical Exam BP 110/70   Pulse 73   Ht 5' 5.75" (1.67 m)   Wt 110 lb (49.9 kg)   LMP 09/11/2016   SpO2 99%   BMI 17.89 kg/m  Alert and in no distress. Pharyngeal area is normal. Neck is supple without adenopathy or thyromegaly. Cardiac exam shows a regular sinus rhythm without murmurs or gallops. Lungs are clear to auscultation. Skin warm and dry. No pallor.       Assessment & Plan:  Iron deficiency anemia, unspecified iron deficiency anemia type - Plan: CBC with Differential/Platelet, Comprehensive metabolic panel, Iron, TIBC and Ferritin Panel  Thrombocytosis (HCC) - Plan: CBC with Differential/Platelet, Iron, TIBC and Ferritin Panel  Nonintractable headache, unspecified chronicity pattern, unspecified headache type - Plan: Comprehensive metabolic panel, TSH  Discussed  keeping a headache journal. Advised to avoid skipping meals. Stay well hydrated, get regular sleep and avoid fluctuations in caffeine intake. We will try to look for triggers. Take 2 Aleve at onset of headache. Call if headaches are becoming more frequent or new symptoms.  Plan to check iron panel and labs for history of IDA and thrombocytosis.  She will follow up for a CPE and fasting labs.

## 2016-09-24 NOTE — Patient Instructions (Signed)
Keep a headache journal as discussed.  Take 2 Aleve at onset of pain.  Make sure you are not skipping meals, stay well hydrated, get regular sleep and avoid fluctuations in caffeine intake.   Start back on iron supplement. Petra Kuba made is a good brand 325 mg (65mg ).  We will call you with labs results.   Return for a physical exam and make sure you have nothing to eat or drink except water 6-8 hours before.   General Headache Without Cause A headache is pain or discomfort felt around the head or neck area. The specific cause of a headache may not be found. There are many causes and types of headaches. A few common ones are:  Tension headaches.  Migraine headaches.  Cluster headaches.  Chronic daily headaches. Follow these instructions at home: Watch your condition for any changes. Take these steps to help with your condition: Managing pain  Take over-the-counter and prescription medicines only as told by your health care provider.  Lie down in a dark, quiet room when you have a headache.  If directed, apply ice to the head and neck area:  Put ice in a plastic bag.  Place a towel between your skin and the bag.  Leave the ice on for 20 minutes, 2-3 times per day.  Use a heating pad or hot shower to apply heat to the head and neck area as told by your health care provider.  Keep lights dim if bright lights bother you or make your headaches worse. Eating and drinking  Eat meals on a regular schedule.  Limit alcohol use.  Decrease the amount of caffeine you drink, or stop drinking caffeine. General instructions  Keep all follow-up visits as told by your health care provider. This is important.  Keep a headache journal to help find out what may trigger your headaches. For example, write down:  What you eat and drink.  How much sleep you get.  Any change to your diet or medicines.  Try massage or other relaxation techniques.  Limit stress.  Sit up straight, and do  not tense your muscles.  Do not use tobacco products, including cigarettes, chewing tobacco, or e-cigarettes. If you need help quitting, ask your health care provider.  Exercise regularly as told by your health care provider.  Sleep on a regular schedule. Get 7-9 hours of sleep, or the amount recommended by your health care provider. Contact a health care provider if:  Your symptoms are not helped by medicine.  You have a headache that is different from the usual headache.  You have nausea or you vomit.  You have a fever. Get help right away if:  Your headache becomes severe.  You have repeated vomiting.  You have a stiff neck.  You have a loss of vision.  You have problems with speech.  You have pain in the eye or ear.  You have muscular weakness or loss of muscle control.  You lose your balance or have trouble walking.  You feel faint or pass out.  You have confusion. This information is not intended to replace advice given to you by your health care provider. Make sure you discuss any questions you have with your health care provider. Document Released: 08/17/2005 Document Revised: 01/23/2016 Document Reviewed: 12/10/2014 Elsevier Interactive Patient Education  2017 [employer].

## 2016-09-25 LAB — IRON,TIBC AND FERRITIN PANEL
%SAT: 23 % (ref 11–50)
Ferritin: 15 ng/mL (ref 10–154)
Iron: 79 ug/dL (ref 40–190)
TIBC: 346 ug/dL (ref 250–450)

## 2016-09-28 ENCOUNTER — Encounter: Payer: Self-pay | Admitting: Internal Medicine

## 2016-11-30 ENCOUNTER — Emergency Department (HOSPITAL_COMMUNITY)
Admission: EM | Admit: 2016-11-30 | Discharge: 2016-11-30 | Disposition: A | Discharge status: Home / Self Care | Payer: Self-pay | Attending: Emergency Medicine | Admitting: Emergency Medicine

## 2016-11-30 DIAGNOSIS — J45909 Unspecified asthma, uncomplicated: Secondary | ICD-10-CM | POA: Insufficient documentation

## 2016-11-30 DIAGNOSIS — K047 Periapical abscess without sinus: Secondary | ICD-10-CM | POA: Insufficient documentation

## 2016-11-30 DIAGNOSIS — F17290 Nicotine dependence, other tobacco product, uncomplicated: Secondary | ICD-10-CM | POA: Insufficient documentation

## 2016-11-30 MED ORDER — HYDROCODONE-ACETAMINOPHEN 5-325 MG PO TABS: 1.0000 | ORAL_TABLET | Freq: Four times a day (QID) | ORAL | 0 refills | Status: DC | PRN

## 2016-11-30 MED ORDER — PENICILLIN V POTASSIUM 500 MG PO TABS: 500.0000 mg | ORAL_TABLET | Freq: Four times a day (QID) | ORAL | 0 refills | Status: AC

## 2016-11-30 NOTE — Discharge Instructions (Signed)
Take ibuprofen or Tylenol for pain. Take penicillin as prescribed until all gone. Take Norco for severe pain only. Follow-up with a dentist.

## 2016-11-30 NOTE — ED Notes (Signed)
ED Provider at bedside. 

## 2016-11-30 NOTE — ED Provider Notes (Signed)
[facility] DEPT Provider Note   CSN: [id number] Arrival date & time: 11/30/16  0143     History   Chief Complaint Chief Complaint  Patient presents with  . Dental Pain    HPI Stacy Warner is a 21 y.o. female.  HPI Stacy Warner is a 21 y.o. female presents to emergency department complaining of dental pain. Patient states that she has "decaying wisdom teeth" and states her pain came back about 2 weeks ago. She states she has been taking Tylenol which has been helping her pain, but states today the pain has not improved with Tylenol so she came to the emergency department. She reports some gum swelling and facial swelling. She states she has history of the same with diagnoses of an abscess. Patient has been to several different dentists, but states "they're asking for $3000 to get my teeth out and I don't have that money." She denies any fever or chills. No difficulty swallowing. No swelling in the tongue. She denies any other associated symptoms.  Past Medical History:  Diagnosis Date  . Anemia   . Asthma   . Chlamydia    age 41  . Infection    UTI    Patient Active Problem List   Diagnosis Date Noted  . Menorrhagia 05/21/2016  . Encounter for immunization 04/23/2016  . Tobacco abuse 04/23/2016  . Marijuana abuse 04/23/2016  . Contraceptive education 04/23/2016  . Iron deficiency anemia 04/18/2016    Class: Acute  . Thrombocytosis ([place]) 04/18/2016    Class: Acute    Past Surgical History:  Procedure Laterality Date  . TONSILLECTOMY      OB History    Gravida Para Term Preterm AB Living   0             SAB TAB Ectopic Multiple Live Births                   Home Medications    Prior to Admission medications   Not on File    Family History Family History  Problem Relation Age of Onset  . Heart disease Paternal Grandmother   . Stroke Paternal Grandmother   . Thyroid disease Paternal Grandfather     Social History Social History  Substance  Use Topics  . Smoking status: Current Every Day Smoker    Packs/day: 0.25    Years: 1.00    Types: Cigars  . Smokeless tobacco: Never Used     Comment: black and mild  . Alcohol use No     Comment: occasional     Allergies   Patient has no known allergies.   Review of Systems Review of Systems  Constitutional: Negative for chills and fever.  HENT: Positive for dental problem. Negative for sore throat and trouble swallowing.   Respiratory: Negative for cough, chest tightness and shortness of breath.   Cardiovascular: Negative for chest pain, palpitations and leg swelling.  Gastrointestinal: Negative for abdominal pain, diarrhea, nausea and vomiting.  Musculoskeletal: Negative for myalgias, neck pain and neck stiffness.  Skin: Negative for rash.  Neurological: Positive for headaches. Negative for dizziness and weakness.  All other systems reviewed and are negative.    Physical Exam Updated Vital Signs BP 118/83 (BP Location: Left Arm)   Pulse 90   Temp 98 F (36.7 C) (Oral)   Resp 18   Ht 5\' 5"  (1.651 m)   Wt 49.9 kg   SpO2 99%   BMI 18.30 kg/m   Physical  Exam  Constitutional: She appears well-developed and well-nourished. No distress.  HENT:  Right lower third molar decayed with surrounding gum swelling. Left lower third molar with large cavity. No surrounding gum or facial swelling. No swelling of the tongue. No trismus.  Eyes: Conjunctivae are normal.  Neck: Neck supple.  Neurological: She is alert.  Skin: Skin is warm and dry.  Nursing note and vitals reviewed.    ED Treatments / Results  Labs (all labs ordered are listed, but only abnormal results are displayed) Labs Reviewed - No data to display  EKG  EKG Interpretation None       Radiology No results found.  Procedures Procedures (including critical care time)  Medications Ordered in ED Medications - No data to display   Initial Impression / Assessment and Plan / ED Course  I have  reviewed the triage vital signs and the nursing notes.  Pertinent labs & imaging results that were available during my care of the patient were reviewed by me and considered in my medical decision making (see chart for details).     Patient in emergency department with dental abscess. She is requesting dental referral. She is requesting something stronger than Tylenol, she has not had any relief with Tylenol today. She has no evidence of Ludwig's angina based on exam. She is in no acute distress. Will prescribe penicillin for infection and give 6 tablets of Norco for severe pain. I will give her referral to a dentist and restart guide for further follow-up.  Vitals:   11/30/16 0149  BP: 118/83  Pulse: 90  Resp: 18  Temp: 98 F (36.7 C)  TempSrc: Oral  SpO2: 99%  Weight: 49.9 kg  Height: 5\' 5"  (1.651 m)     Final Clinical Impressions(s) / ED Diagnoses   Final diagnoses:  Dental infection    New Prescriptions New Prescriptions   No medications on file     Jeannett Senior, PA-C 11/30/16 [place], MD 11/30/16 [phone]

## 2016-11-30 NOTE — ED Notes (Signed)
Pt c/o dental pain for 3 weeks. Pt has been taking 500 mg tylenol, and today they haven't been working. Pt still has wisdom teeth, and has not seen a dentist yet about the pain.

## 2016-11-30 NOTE — ED Notes (Signed)
Pt ambulatory and independent at discharge.  Verbalized understanding of discharge instructions 

## 2016-11-30 NOTE — ED Notes (Signed)
Pt c/o Dental pain for a month in back left bottom of mouth. Pt needs to get wisdom teeth removed, cannot currently afford this. Some bleeding from mouth yesterday.

## 2017-01-07 ENCOUNTER — Emergency Department (HOSPITAL_COMMUNITY): Payer: Self-pay

## 2017-01-07 ENCOUNTER — Encounter (HOSPITAL_COMMUNITY): Payer: Self-pay

## 2017-01-07 ENCOUNTER — Emergency Department (HOSPITAL_COMMUNITY)
Admission: EM | Admit: 2017-01-07 | Discharge: 2017-01-07 | Disposition: A | Discharge status: Home / Self Care | Payer: Self-pay | Attending: Emergency Medicine | Admitting: Emergency Medicine

## 2017-01-07 DIAGNOSIS — Y999 Unspecified external cause status: Secondary | ICD-10-CM | POA: Insufficient documentation

## 2017-01-07 DIAGNOSIS — X501XXA Overexertion from prolonged static or awkward postures, initial encounter: Secondary | ICD-10-CM | POA: Insufficient documentation

## 2017-01-07 DIAGNOSIS — J45909 Unspecified asthma, uncomplicated: Secondary | ICD-10-CM | POA: Insufficient documentation

## 2017-01-07 DIAGNOSIS — Y929 Unspecified place or not applicable: Secondary | ICD-10-CM | POA: Insufficient documentation

## 2017-01-07 DIAGNOSIS — M62838 Other muscle spasm: Secondary | ICD-10-CM | POA: Insufficient documentation

## 2017-01-07 DIAGNOSIS — Y939 Activity, unspecified: Secondary | ICD-10-CM | POA: Insufficient documentation

## 2017-01-07 DIAGNOSIS — F17290 Nicotine dependence, other tobacco product, uncomplicated: Secondary | ICD-10-CM | POA: Insufficient documentation

## 2017-01-07 MED ORDER — METHOCARBAMOL 500 MG PO TABS
1000.0000 mg | ORAL_TABLET | Freq: Once | ORAL | Status: AC
  Administered 2017-01-07: 1000 mg via ORAL
  Filled 2017-01-07: qty 2

## 2017-01-07 MED ORDER — METHOCARBAMOL 500 MG PO TABS: 500.0000 mg | ORAL_TABLET | Freq: Two times a day (BID) | ORAL | 0 refills | Status: DC

## 2017-01-07 MED ORDER — ACETAMINOPHEN 500 MG PO TABS
1000.0000 mg | ORAL_TABLET | Freq: Once | ORAL | Status: AC
  Administered 2017-01-07: 1000 mg via ORAL
  Filled 2017-01-07: qty 2

## 2017-01-07 MED ORDER — IBUPROFEN 800 MG PO TABS: 800.0000 mg | ORAL_TABLET | Freq: Three times a day (TID) | ORAL | 0 refills | Status: DC

## 2017-01-07 MED ORDER — IBUPROFEN 800 MG PO TABS
800.0000 mg | ORAL_TABLET | Freq: Once | ORAL | Status: AC
  Administered 2017-01-07: 800 mg via ORAL
  Filled 2017-01-07: qty 1

## 2017-01-07 NOTE — ED Triage Notes (Signed)
Pt reports she started experiencing some right shoulder pain approx one hour ago. Pt denies any injury. Tearful in triage.

## 2017-01-07 NOTE — ED Notes (Signed)
Patient states she got out of bed to go to the bathroom, stretched her arms over her head and heard a pop just before pain began.

## 2017-01-07 NOTE — ED Provider Notes (Signed)
[facility] DEPT Provider Note   CSN: [id number] Arrival date & time: 01/07/17  0535     History   Chief Complaint Chief Complaint  Patient presents with  . Shoulder Pain    HPI Stacy Warner is a 21 y.o. female.  The history is provided by the patient.  Shoulder Injury  This is a new problem. The current episode started 1 to 2 hours ago. The problem occurs constantly. The problem has not changed since onset.Pertinent negatives include no chest pain, no abdominal pain, no headaches and no shortness of breath. Nothing aggravates the symptoms. Nothing relieves the symptoms. She has tried nothing for the symptoms. The treatment provided no relief.  Rolled over in bed and had immediate pain in the right shoulder.    Past Medical History:  Diagnosis Date  . Anemia   . Asthma   . Chlamydia    age 20  . Infection    UTI    Patient Active Problem List   Diagnosis Date Noted  . Menorrhagia 05/21/2016  . Encounter for immunization 04/23/2016  . Tobacco abuse 04/23/2016  . Marijuana abuse 04/23/2016  . Contraceptive education 04/23/2016  . Iron deficiency anemia 04/18/2016    Class: Acute  . Thrombocytosis ([place]) 04/18/2016    Class: Acute    Past Surgical History:  Procedure Laterality Date  . TONSILLECTOMY      OB History    Gravida Para Term Preterm AB Living   0             SAB TAB Ectopic Multiple Live Births                   Home Medications    Prior to Admission medications   Medication Sig Start Date End Date Taking? Authorizing Provider  HYDROcodone-acetaminophen (NORCO) 5-325 MG tablet Take 1 tablet by mouth every 6 (six) hours as needed for moderate pain. 11/30/16   Jeannett Senior, PA-C    Family History Family History  Problem Relation Age of Onset  . Heart disease Paternal Grandmother   . Stroke Paternal Grandmother   . Thyroid disease Paternal Grandfather     Social History Social History  Substance Use Topics  . Smoking status:  Current Every Day Smoker    Packs/day: 0.25    Years: 1.00    Types: Cigars  . Smokeless tobacco: Never Used     Comment: black and mild  . Alcohol use No     Comment: occasional     Allergies   Patient has no known allergies.   Review of Systems Review of Systems  Constitutional: Negative for fever.  Respiratory: Negative for shortness of breath.   Cardiovascular: Negative for chest pain.  Gastrointestinal: Negative for abdominal pain.  Musculoskeletal: Positive for arthralgias. Negative for back pain, gait problem, joint swelling, myalgias, neck pain and neck stiffness.  Neurological: Negative for weakness, numbness and headaches.  All other systems reviewed and are negative.    Physical Exam Updated Vital Signs BP 116/81 (BP Location: Left Arm)   Pulse 83   Temp 98.3 F (36.8 C) (Oral)   Resp 18   LMP 12/17/2016 (Approximate)   SpO2 100%   Physical Exam  Constitutional: She is oriented to person, place, and time. She appears well-developed and well-nourished. No distress.  HENT:  Head: Normocephalic and atraumatic.  Mouth/Throat: No oropharyngeal exudate.  Eyes: Conjunctivae and EOM are normal. Pupils are equal, round, and reactive to light.  Neck: Normal range of  motion. Neck supple. No JVD present.  Cardiovascular: Normal rate, regular rhythm, normal heart sounds and intact distal pulses.   Pulmonary/Chest: Effort normal and breath sounds normal. No stridor. She has no wheezes. She has no rales.  Abdominal: Soft. Bowel sounds are normal. She exhibits no mass. There is no tenderness. There is no rebound and no guarding.  Musculoskeletal: Normal range of motion.       Right shoulder: She exhibits spasm. She exhibits normal range of motion, no tenderness, no bony tenderness, no swelling, no effusion, no crepitus, no deformity, no laceration, no pain, normal pulse and normal strength.       Right elbow: Normal.      Right wrist: Normal.       Cervical back: Normal.        Right upper arm: Normal. She exhibits no tenderness, no bony tenderness, no swelling, no edema, no deformity and no laceration.       Right forearm: Normal.  Lymphadenopathy:    She has no cervical adenopathy.  Neurological: She is alert and oriented to person, place, and time. She displays normal reflexes. No sensory deficit. She exhibits normal muscle tone.  Skin: Skin is warm and dry. Capillary refill takes less than 2 seconds.  Psychiatric: She has a normal mood and affect.     ED Treatments / Results   Vitals:   01/07/17 0544  BP: 116/81  Pulse: 83  Resp: 18  Temp: 98.3 F (36.8 C)    Radiology Dg Shoulder Right  Result Date: 01/07/2017 CLINICAL DATA:  Patient states she awoke this morning and rolled over and felt pop either in neck or right shoulder, pain right shoulder, right posterior neck, limited range of motion. EXAM: RIGHT SHOULDER - 2+ VIEW COMPARISON:  None. FINDINGS: There is no evidence of fracture or dislocation. There is no evidence of arthropathy or other focal bone abnormality. Soft tissues are unremarkable. IMPRESSION: Negative. Electronically Signed   By: Lucienne Capers M.D.   On: 01/07/2017 06:14    Procedures Procedures (including critical care time)  Medications Ordered in ED Medications  ibuprofen (ADVIL,MOTRIN) tablet 800 mg (not administered)  acetaminophen (TYLENOL) tablet 1,000 mg (not administered)  methocarbamol (ROBAXIN) tablet 1,000 mg (not administered)      Final Clinical Impressions(s) / ED Diagnoses  I have reviewed triage vital signs and the nursing notes. Pertinent labs & imaging results that were available during my care of the patient were reviewed by me and considered in my medical decision making (see chart for details). The patient is nontoxic-appearing on exam and vital signs are within normal limits. Return for chest pain especially with exertion.  Shortness of breath,  Weakness numbness changes in vision or speech fevers  leg pain or any concerns.  Symptoms consistent with spasm.    After history, exam, and medical workup I feel the patient has been appropriately medically screened and is safe for discharge home. Pertinent diagnoses were discussed with the patient. Patient was given return precautions.     Laneka Mcgrory, MD 01/07/17 3614

## 2017-01-07 NOTE — ED Notes (Signed)
Provider at bedside

## 2017-02-18 ENCOUNTER — Emergency Department (HOSPITAL_COMMUNITY): Payer: Self-pay

## 2017-02-18 ENCOUNTER — Emergency Department (HOSPITAL_COMMUNITY)
Admission: EM | Admit: 2017-02-18 | Discharge: 2017-02-18 | Disposition: A | Discharge status: Home / Self Care | Payer: Self-pay | Attending: Emergency Medicine | Admitting: Emergency Medicine

## 2017-02-18 ENCOUNTER — Encounter (HOSPITAL_COMMUNITY): Payer: Self-pay

## 2017-02-18 DIAGNOSIS — Y929 Unspecified place or not applicable: Secondary | ICD-10-CM | POA: Insufficient documentation

## 2017-02-18 DIAGNOSIS — Z23 Encounter for immunization: Secondary | ICD-10-CM | POA: Insufficient documentation

## 2017-02-18 DIAGNOSIS — F17290 Nicotine dependence, other tobacco product, uncomplicated: Secondary | ICD-10-CM | POA: Insufficient documentation

## 2017-02-18 DIAGNOSIS — T07XXXA Unspecified multiple injuries, initial encounter: Secondary | ICD-10-CM | POA: Insufficient documentation

## 2017-02-18 DIAGNOSIS — S93401A Sprain of unspecified ligament of right ankle, initial encounter: Secondary | ICD-10-CM | POA: Insufficient documentation

## 2017-02-18 DIAGNOSIS — Y999 Unspecified external cause status: Secondary | ICD-10-CM | POA: Insufficient documentation

## 2017-02-18 DIAGNOSIS — Y939 Activity, unspecified: Secondary | ICD-10-CM | POA: Insufficient documentation

## 2017-02-18 DIAGNOSIS — W19XXXA Unspecified fall, initial encounter: Secondary | ICD-10-CM | POA: Insufficient documentation

## 2017-02-18 MED ORDER — DICLOFENAC SODIUM 50 MG PO TBEC
50.0000 mg | DELAYED_RELEASE_TABLET | Freq: Two times a day (BID) | ORAL | 0 refills | Status: DC
Start: 2017-02-18 — End: 2017-06-03

## 2017-02-18 MED ORDER — TETANUS-DIPHTH-ACELL PERTUSSIS 5-2.5-18.5 LF-MCG/0.5 IM SUSP
0.5000 mL | Freq: Once | INTRAMUSCULAR | Status: AC
Start: 2017-02-18 — End: 2017-02-18
  Administered 2017-02-18: 0.5 mL via INTRAMUSCULAR
  Filled 2017-02-18: qty 0.5

## 2017-02-18 MED ORDER — BACITRACIN ZINC 500 UNIT/GM EX OINT
TOPICAL_OINTMENT | Freq: Once | CUTANEOUS | Status: AC
  Administered 2017-02-18: 1 via TOPICAL

## 2017-02-18 MED ORDER — HYDROCODONE-ACETAMINOPHEN 5-325 MG PO TABS
1.0000 | ORAL_TABLET | Freq: Once | ORAL | Status: AC
  Administered 2017-02-18: 1 via ORAL
  Filled 2017-02-18: qty 1

## 2017-02-18 NOTE — ED Notes (Signed)
Pt stable, understands discharge instructions, and reasons for return.   

## 2017-02-18 NOTE — Progress Notes (Signed)
Orthopedic Tech Progress Note Patient Details:  Stacy Warner Apr 29, 1996 [id number]  Ortho Devices Type of Ortho Device: ASO Ortho Device/Splint Location: LLE Ortho Device/Splint Interventions: Ordered, Application   Braulio Bosch 02/18/2017, 11:04 PM

## 2017-02-18 NOTE — ED Triage Notes (Signed)
Pt reports left ankle pain secondary to a trip and fall this morning.

## 2017-02-18 NOTE — ED Provider Notes (Signed)
[facility] DEPT Provider Note   CSN: [id number] Arrival date & time: 02/18/17  2006  By signing my name below, I, Dora Sims, attest that this documentation has been prepared under the direction and in the presence of [place]. Janit Bern, NP. Electronically Signed: Dora Sims, Scribe. 02/18/2017. 10:39 PM.  History   Chief Complaint Chief Complaint  Patient presents with  . Ankle Injury   The history is provided by the patient. No language interpreter was used.  Ankle Injury  This is a new problem. The current episode started 6 to 12 hours ago. The problem occurs constantly. The problem has not changed since onset.The symptoms are aggravated by standing and walking. Treatments tried: ibuprofen. Improvement on treatment: transient.    HPI Comments: Stacy Warner is a 21 y.o. female who presents to the Emergency Department complaining of constant left ankle pain s/p a mechanical fall that occurred this morning. No additional injuries, head trauma, or LOC. She reports some associated left ankle swelling, an abrasion to her posterior left ankle, and some minor abrasions to her right foot. She states her left ankle pain is worse with ROM and weight bearing. Patient tried ibuprofen around 6 PM with transient relief of her left ankle pain. Her tetanus status is unknown. She denies numbness/tingling, bruising, or any other associated symptoms.  Past Medical History:  Diagnosis Date  . Anemia   . Asthma   . Chlamydia    age 28  . Infection    UTI    Patient Active Problem List   Diagnosis Date Noted  . Menorrhagia 05/21/2016  . Encounter for immunization 04/23/2016  . Tobacco abuse 04/23/2016  . Marijuana abuse 04/23/2016  . Contraceptive education 04/23/2016  . Iron deficiency anemia 04/18/2016    Class: Acute  . Thrombocytosis ([place]) 04/18/2016    Class: Acute    Past Surgical History:  Procedure Laterality Date  . TONSILLECTOMY      OB History    Gravida Para Term  Preterm AB Living   0             SAB TAB Ectopic Multiple Live Births                   Home Medications    Prior to Admission medications   Medication Sig Start Date End Date Taking? Authorizing Provider  diclofenac (VOLTAREN) 50 MG EC tablet Take 1 tablet (50 mg total) by mouth 2 (two) times daily. 02/18/17   Ashley Murrain, NP  HYDROcodone-acetaminophen (NORCO) 5-325 MG tablet Take 1 tablet by mouth every 6 (six) hours as needed for moderate pain. 11/30/16   Kirichenko, Tatyana, PA-C  ibuprofen (ADVIL,MOTRIN) 800 MG tablet Take 1 tablet (800 mg total) by mouth 3 (three) times daily. 01/07/17   Palumbo, April, MD  methocarbamol (ROBAXIN) 500 MG tablet Take 1 tablet (500 mg total) by mouth 2 (two) times daily. 01/07/17   Palumbo, April, MD    Family History Family History  Problem Relation Age of Onset  . Heart disease Paternal Grandmother   . Stroke Paternal Grandmother   . Thyroid disease Paternal Grandfather     Social History Social History  Substance Use Topics  . Smoking status: Current Every Day Smoker    Packs/day: 0.25    Years: 1.00    Types: Cigars  . Smokeless tobacco: Never Used     Comment: black and mild  . Alcohol use No     Comment: occasional  Allergies   Patient has no known allergies.   Review of Systems Review of Systems  Musculoskeletal: Positive for arthralgias and joint swelling.  Skin: Positive for wound. Negative for color change.  Neurological: Negative for syncope and numbness.  All other systems reviewed and are negative.  Physical Exam Updated Vital Signs BP 108/78 (BP Location: Right Arm)   Pulse 65   Temp 98.4 F (36.9 C) (Oral)   Resp 16   LMP 02/10/2017   SpO2 98%   Physical Exam  Constitutional: She is oriented to person, place, and time. She appears well-developed and well-nourished. No distress.  HENT:  Head: Normocephalic and atraumatic.  Eyes: Conjunctivae and EOM are normal.  Neck: Neck supple. No tracheal  deviation present.  Cardiovascular: Normal rate and intact distal pulses.   Pulses:      Dorsalis pedis pulses are 2+ on the right side, and 2+ on the left side.  Pulmonary/Chest: Effort normal. No respiratory distress.  Musculoskeletal: She exhibits tenderness.       Left ankle: She exhibits swelling and ecchymosis. She exhibits no deformity. Lacerations: abrasion. Tenderness. Lateral malleolus tenderness found. Achilles tendon exhibits pain. Achilles tendon exhibits no defect and normal Thompson's test results.       Feet:  Superficial abrasions to the dorsum of the right foot at the base of the second and third toes. Abrasion to the left foot at the base of the great toe. Swelling and tenderness to the lateral aspect of the left ankle. Plantar flexion and dorsiflexion on the left without difficulty. Tenderness over the left achilles. Negative Thompson's. Abrasion to the posterior aspect of the left ankle.   Neurological: She is alert and oriented to person, place, and time.  Skin: Skin is warm and dry.  Psychiatric: She has a normal mood and affect. Her behavior is normal.  Nursing note and vitals reviewed.  ED Treatments / Results  Labs (all labs ordered are listed, but only abnormal results are displayed) Labs Reviewed - No data to display   Radiology Dg Ankle Complete Left  Result Date: 02/18/2017 CLINICAL DATA:  Twisted left ankle today. EXAM: LEFT ANKLE COMPLETE - 3+ VIEW COMPARISON:  None. FINDINGS: There is no evidence of fracture, dislocation, or joint effusion. There is no evidence of arthropathy or other focal bone abnormality. Soft tissues are unremarkable. IMPRESSION: Negative. Electronically Signed   By: Abelardo Diesel M.D.   On: 02/18/2017 20:43    Procedures Procedures (including critical care time)  DIAGNOSTIC STUDIES: Oxygen Saturation is 100% on RA, normal by my interpretation.    COORDINATION OF CARE: 10:37 PM Discussed treatment plan with pt at bedside and pt  agreed to plan.  Medications Ordered in ED Medications  bacitracin ointment (1 application Topical Given 02/18/17 2300)  Tdap (BOOSTRIX) injection 0.5 mL (0.5 mLs Intramuscular Given 02/18/17 2305)  HYDROcodone-acetaminophen (NORCO/VICODIN) 5-325 MG per tablet 1 tablet (1 tablet Oral Given 02/18/17 2305)     Initial Impression / Assessment and Plan / ED Course  I have reviewed the triage vital signs and the nursing notes. Patient X-Ray negative for obvious fracture or dislocation.  Pt advised to follow up with PCP. Patient given splint while in ED, conservative therapy recommended and discussed. Patient will be discharged home & is agreeable with above plan. Returns precautions discussed. Pt appears safe for discharge. Patient remains neurovascularly intact s/p application of splint.   Final Clinical Impressions(s) / ED Diagnoses   Final diagnoses:  Abrasions of multiple sites  Sprain  of right ankle, unspecified ligament, initial encounter  Fall, initial encounter    New Prescriptions Discharge Medication List as of 02/18/2017 10:41 PM    START taking these medications   Details  diclofenac (VOLTAREN) 50 MG EC tablet Take 1 tablet (50 mg total) by mouth 2 (two) times daily., Starting Thu 02/18/2017, Print       I personally performed the services described in this documentation, which was scribed in my presence. The recorded information has been reviewed and is accurate.    Debroah Baller [place], [place] 02/20/17 [place], Kevin, MD 02/22/17 [phone]

## 2017-06-03 ENCOUNTER — Emergency Department (HOSPITAL_COMMUNITY): Admission: EM | Admit: 2017-06-03 | Discharge: 2017-06-03 | Discharge status: Against Medical Advice | Payer: Self-pay

## 2017-06-03 ENCOUNTER — Emergency Department (HOSPITAL_COMMUNITY)
Admission: EM | Admit: 2017-06-03 | Discharge: 2017-06-04 | Disposition: A | Discharge status: Home / Self Care | Payer: Self-pay | Attending: Emergency Medicine | Admitting: Emergency Medicine

## 2017-06-03 ENCOUNTER — Encounter (HOSPITAL_COMMUNITY): Payer: Self-pay | Admitting: Emergency Medicine

## 2017-06-03 DIAGNOSIS — K0401 Reversible pulpitis: Secondary | ICD-10-CM

## 2017-06-03 DIAGNOSIS — Z79899 Other long term (current) drug therapy: Secondary | ICD-10-CM | POA: Insufficient documentation

## 2017-06-03 DIAGNOSIS — D649 Anemia, unspecified: Secondary | ICD-10-CM | POA: Insufficient documentation

## 2017-06-03 DIAGNOSIS — J45909 Unspecified asthma, uncomplicated: Secondary | ICD-10-CM | POA: Insufficient documentation

## 2017-06-03 DIAGNOSIS — R55 Syncope and collapse: Secondary | ICD-10-CM

## 2017-06-03 DIAGNOSIS — F17210 Nicotine dependence, cigarettes, uncomplicated: Secondary | ICD-10-CM | POA: Insufficient documentation

## 2017-06-03 LAB — BASIC METABOLIC PANEL
Anion gap: 6 (ref 5–15)
BUN: 9 mg/dL (ref 6–20)
CHLORIDE: 108 mmol/L (ref 101–111)
CO2: 24 mmol/L (ref 22–32)
CREATININE: 0.94 mg/dL (ref 0.44–1.00)
Calcium: 9 mg/dL (ref 8.9–10.3)
GFR calc Af Amer: >60 mL/min (ref >60)
GFR calc non Af Amer: >60 mL/min (ref >60)
GLUCOSE: 93 mg/dL (ref 65–99)
Potassium: 4.3 mmol/L (ref 3.5–5.1)
SODIUM: 138 mmol/L (ref 135–145)

## 2017-06-03 LAB — CBC
HCT: 39.1 % (ref 36.0–46.0)
Hemoglobin: 13.3 g/dL (ref 12.0–15.0)
MCH: 31.3 pg (ref 26.0–34.0)
MCHC: 34 g/dL (ref 30.0–36.0)
MCV: 92 fL (ref 78.0–100.0)
PLATELETS: 264 10*3/uL (ref 150–400)
RBC: 4.25 MIL/uL (ref 3.87–5.11)
RDW: 14.4 % (ref 11.5–15.5)
WBC: 7.6 10*3/uL (ref 4.0–10.5)

## 2017-06-03 LAB — POC URINE PREG, ED: PREG TEST UR: NEGATIVE

## 2017-06-03 LAB — CBG MONITORING, ED: Glucose-Capillary: 118 mg/dL — ABNORMAL HIGH (ref 65–99)

## 2017-06-03 NOTE — ED Notes (Signed)
No response when called from lobby to triage.

## 2017-06-03 NOTE — ED Notes (Signed)
Bed: [facility] Expected date:  Expected time:  Means of arrival:  Comments: 

## 2017-06-03 NOTE — ED Triage Notes (Signed)
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

## 2017-06-03 NOTE — ED Provider Notes (Signed)
[facility] DEPT Provider Note   CSN: [id number] Arrival date & time: 06/03/17  1940     History   Chief Complaint Chief Complaint  Patient presents with  . Loss of Consciousness    HPI Stacy Warner is a 21 y.o. female.  The history is provided by the patient.  Loss of Consciousness   This is a recurrent (reports that she passed out when her Hb was low) problem. The current episode started 3 to 5 hours ago. Episode frequency: once today. The problem has been resolved. She lost consciousness for a period of 1 to 5 minutes. Associated with: prolonged standing. Associated symptoms include diaphoresis, light-headedness and visual change (world was closing in). Pertinent negatives include chest pain, confusion and palpitations.   Also complain of tooth pain for several weeks.  Past Medical History:  Diagnosis Date  . Anemia   . Asthma   . Chlamydia    age 64  . Infection    UTI    Patient Active Problem List   Diagnosis Date Noted  . Menorrhagia 05/21/2016  . Encounter for immunization 04/23/2016  . Tobacco abuse 04/23/2016  . Marijuana abuse 04/23/2016  . Contraceptive education 04/23/2016  . Iron deficiency anemia 04/18/2016    Class: Acute  . Thrombocytosis ([place]) 04/18/2016    Class: Acute    Past Surgical History:  Procedure Laterality Date  . TONSILLECTOMY      OB History    Gravida Para Term Preterm AB Living   0             SAB TAB Ectopic Multiple Live Births                   Home Medications    Prior to Admission medications   Medication Sig Start Date End Date Taking? Authorizing Provider  ferrous sulfate 325 (65 FE) MG tablet Take 325 mg by mouth 2 (two) times daily with a meal.   Yes [provider]  penicillin v potassium (VEETID) 500 MG tablet Take 1 tablet (500 mg total) by mouth 4 (four) times daily. 06/04/17 06/11/17  Fatima Blank, MD    Family History Family History  Problem Relation Age of Onset  . Heart  disease Paternal Grandmother   . Stroke Paternal Grandmother   . Thyroid disease Paternal Grandfather     Social History Social History  Substance Use Topics  . Smoking status: Current Every Day Smoker    Packs/day: 0.25    Years: 1.00    Types: Cigars  . Smokeless tobacco: Never Used     Comment: black and mild  . Alcohol use No     Comment: occasional     Allergies   Patient has no known allergies.   Review of Systems Review of Systems  Constitutional: Positive for diaphoresis.  Cardiovascular: Positive for syncope. Negative for chest pain and palpitations.  Neurological: Positive for light-headedness.  Psychiatric/Behavioral: Negative for confusion.  All other systems are reviewed and are negative for acute change except as noted in the HPI    Physical Exam Updated Vital Signs BP 107/70 (BP Location: Left Arm)   Pulse 74   Temp 99.1 F (37.3 C) (Oral)   Resp 18   Ht 5\' 5"  (1.651 m)   Wt 49.5 kg (109 lb 3.2 oz)   LMP 05/18/2017 (Exact Date)   SpO2 100%   BMI 18.17 kg/m   Physical Exam  Constitutional: She is oriented to person, place, and time.  She appears well-developed and well-nourished. No distress.  HENT:  Head: Normocephalic and atraumatic.  Nose: Nose normal.  Mouth/Throat:    Eyes: Pupils are equal, round, and reactive to light. Conjunctivae and EOM are normal. Right eye exhibits no discharge. Left eye exhibits no discharge. No scleral icterus.  Neck: Normal range of motion. Neck supple.  Cardiovascular: Normal rate and regular rhythm.  Exam reveals no gallop and no friction rub.   No murmur heard. Pulmonary/Chest: Effort normal and breath sounds normal. No stridor. No respiratory distress. She has no rales.  Abdominal: Soft. She exhibits no distension. There is no tenderness.  Musculoskeletal: She exhibits no edema or tenderness.  Neurological: She is alert and oriented to person, place, and time.  Skin: Skin is warm and dry. No rash noted.  She is not diaphoretic. No erythema.  Psychiatric: She has a normal mood and affect.  Vitals reviewed.    ED Treatments / Results  Labs (all labs ordered are listed, but only abnormal results are displayed) Labs Reviewed  CBG MONITORING, ED - Abnormal; Notable for the following:       Result Value   Glucose-Capillary 118 (*)    All other components within normal limits  BASIC METABOLIC PANEL  CBC  TROPONIN I  POC URINE PREG, ED    EKG  EKG Interpretation  Date/Time:  Thursday June 03 2017 20:32:48 EDT Ventricular Rate:  80 PR Interval:    QRS Duration: 103 QT Interval:  351 QTC Calculation: 405 R Axis:   88 Text Interpretation:  Sinus rhythm ST elevation, no reciprocal changes diffuse ST elevation similar to 2012 Confirmed by Sherwood Gambler [phone]) on 06/03/2017 11:12:46 PM       Radiology No results found.  Procedures Procedures (including critical care time)  Medications Ordered in ED Medications - No data to display   Initial Impression / Assessment and Plan / ED Course  I have reviewed the triage vital signs and the nursing notes.  Pertinent labs & imaging results that were available during my care of the patient were reviewed by me and considered in my medical decision making (see chart for details).     Consistent with vaso-vagal syncope. EKG w/o acute ischemia, dysrhythmias, blocks, brugada, epsilon waves, evidence of HOCM. No recent infections, trauma, associated chest pain or SOB. UPT neg.  Dental pain due fractured tooth. No abscess. Likely pulpitis. Will treat Abx. Recommended Dentist.   Final Clinical Impressions(s) / ED Diagnoses   Final diagnoses:  Syncope and collapse  Pulpitis   Disposition: Discharge  Condition: Good  I have discussed the results, Dx and Tx plan with the patient who expressed understanding and agree(s) with the plan. Discharge instructions discussed at great length. The patient was given strict return precautions  who verbalized understanding of the instructions. No further questions at time of discharge.    New Prescriptions   PENICILLIN V POTASSIUM (VEETID) 500 MG TABLET    Take 1 tablet (500 mg total) by mouth 4 (four) times daily.    Follow Up: Girtha Rm, NP-C [place] [address] [phone]  Schedule an appointment as soon as possible for a visit  As needed  Dentistry         [place], Grayce Sessions, MD 06/04/17 [phone]

## 2017-06-04 LAB — TROPONIN I: Troponin I: <0.03 ng/mL (ref <0.03)

## 2017-06-04 MED ORDER — PENICILLIN V POTASSIUM 500 MG PO TABS: 500.0000 mg | ORAL_TABLET | Freq: Four times a day (QID) | ORAL | 0 refills | Status: AC

## 2017-06-04 NOTE — ED Notes (Signed)
Dr Leonette Monarch aware of troponin

## 2017-06-05 ENCOUNTER — Emergency Department (HOSPITAL_COMMUNITY)
Admission: EM | Admit: 2017-06-05 | Discharge: 2017-06-06 | Disposition: A | Discharge status: Home / Self Care | Payer: Self-pay | Attending: Emergency Medicine | Admitting: Emergency Medicine

## 2017-06-05 ENCOUNTER — Encounter (HOSPITAL_COMMUNITY): Payer: Self-pay | Admitting: *Deleted

## 2017-06-05 DIAGNOSIS — J45909 Unspecified asthma, uncomplicated: Secondary | ICD-10-CM | POA: Insufficient documentation

## 2017-06-05 DIAGNOSIS — Z79899 Other long term (current) drug therapy: Secondary | ICD-10-CM | POA: Insufficient documentation

## 2017-06-05 DIAGNOSIS — G43809 Other migraine, not intractable, without status migrainosus: Secondary | ICD-10-CM | POA: Insufficient documentation

## 2017-06-05 DIAGNOSIS — F17210 Nicotine dependence, cigarettes, uncomplicated: Secondary | ICD-10-CM | POA: Insufficient documentation

## 2017-06-05 MED ORDER — SODIUM CHLORIDE 0.9 % IV BOLUS (SEPSIS)
1000.0000 mL | Freq: Once | INTRAVENOUS | Status: AC
  Administered 2017-06-05: 1000 mL via INTRAVENOUS

## 2017-06-05 MED ORDER — METOCLOPRAMIDE HCL 5 MG/ML IJ SOLN
10.0000 mg | INTRAMUSCULAR | Status: AC
  Administered 2017-06-05: 10 mg via INTRAVENOUS
  Filled 2017-06-05: qty 2

## 2017-06-05 MED ORDER — BUTALBITAL-APAP-CAFFEINE 50-325-40 MG PO TABS: 1.0000 | ORAL_TABLET | Freq: Three times a day (TID) | ORAL | 0 refills | Status: DC | PRN

## 2017-06-05 MED ORDER — KETOROLAC TROMETHAMINE 30 MG/ML IJ SOLN
30.0000 mg | Freq: Once | INTRAMUSCULAR | Status: AC
  Administered 2017-06-05: 30 mg via INTRAVENOUS
  Filled 2017-06-05: qty 1

## 2017-06-05 NOTE — Discharge Instructions (Signed)
We recommend that you take Fioricet as prescribed for continued headache symptoms. We advise close follow-up with a neurologist given the frequency of your headaches. You may follow up with your primary care doctor in the interim.

## 2017-06-05 NOTE — ED Triage Notes (Signed)
The pt has had a headache for 4-5 days she was seen at [facility] ed Thursday night for the same.  She still has the headache and she is crying in triiage  lmp now

## 2017-06-05 NOTE — ED Notes (Signed)
Pt stable, ambulatory, states understanding of discharge instructions 

## 2017-06-05 NOTE — ED Provider Notes (Signed)
[facility] DEPT Provider Note   CSN: [id number] Arrival date & time: 06/05/17  2025     History   Chief Complaint Chief Complaint  Patient presents with  . Headache    HPI Stacy Warner is a 21 y.o. female.  21 year old female with a history of anemia and asthma presents to the emergency department for complaints of headache. She reports a left parietal headache which has been constant and waxing and waning in severity over the past 5 days. She states that she has tried Motrin for symptoms which will temporarily improve her headache, but nothing has helped it resolved. She also has had some improvement in the past after boiling peppermint oil, but this has provided no relief as well. She notes associated photophobia. Pain radiates down the left side of her neck into her left shoulder. This is characteristic of her past headaches. No recent head injury or trauma. No fevers, extremity numbness, paresthesias, extremity weakness.   The history is provided by the patient. No language interpreter was used.  Headache      Past Medical History:  Diagnosis Date  . Anemia   . Asthma   . Chlamydia    age 37  . Infection    UTI    Patient Active Problem List   Diagnosis Date Noted  . Menorrhagia 05/21/2016  . Encounter for immunization 04/23/2016  . Tobacco abuse 04/23/2016  . Marijuana abuse 04/23/2016  . Contraceptive education 04/23/2016  . Iron deficiency anemia 04/18/2016    Class: Acute  . Thrombocytosis ([place]) 04/18/2016    Class: Acute    Past Surgical History:  Procedure Laterality Date  . TONSILLECTOMY      OB History    Gravida Para Term Preterm AB Living   0             SAB TAB Ectopic Multiple Live Births                   Home Medications    Prior to Admission medications   Medication Sig Start Date End Date Taking? Authorizing Provider  ferrous sulfate 325 (65 FE) MG tablet Take 325 mg by mouth 2 (two) times daily with a meal.   Yes [provider]  penicillin v potassium (VEETID) 500 MG tablet Take 1 tablet (500 mg total) by mouth 4 (four) times daily. 06/04/17 06/11/17 Yes Cardama, Grayce Sessions, MD  butalbital-acetaminophen-caffeine (FIORICET, ESGIC) [phone] MG tablet Take 1-2 tablets by mouth every 8 (eight) hours as needed for headache. 06/05/17 06/05/18  Antonietta Breach, PA-C    Family History Family History  Problem Relation Age of Onset  . Heart disease Paternal Grandmother   . Stroke Paternal Grandmother   . Thyroid disease Paternal Grandfather     Social History Social History  Substance Use Topics  . Smoking status: Current Every Day Smoker    Packs/day: 0.25    Years: 1.00    Types: Cigars  . Smokeless tobacco: Never Used     Comment: black and mild  . Alcohol use No     Comment: occasional     Allergies   Patient has no known allergies.   Review of Systems Review of Systems  Neurological: Positive for headaches.  Ten systems reviewed and are negative for acute change, except as noted in the HPI.    Physical Exam Updated Vital Signs BP (!) 128/91 (BP Location: Left Arm)   Pulse 90   Temp 98 F (36.7 C) (Oral)  Resp 16   Ht 5\' 5"  (1.651 m)   Wt 49.9 kg (110 lb)   LMP 05/18/2017 (Exact Date)   SpO2 99%   BMI 18.30 kg/m   Physical Exam  Constitutional: She is oriented to person, place, and time. She appears well-developed and well-nourished. No distress.  Nontoxic and in NAD  HENT:  Head: Normocephalic and atraumatic.  Mouth/Throat: Oropharynx is clear and moist.  Symmetric rise of the uvula with phonation  Eyes: Pupils are equal, round, and reactive to light. Conjunctivae and EOM are normal. No scleral icterus.  Neck: Normal range of motion.  Cardiovascular: Normal rate, regular rhythm and intact distal pulses.   Pulmonary/Chest: Effort normal. No respiratory distress. She has no wheezes. She has no rales.  Respirations even and unlabored  Musculoskeletal: Normal range of  motion.  Neurological: She is alert and oriented to person, place, and time. She exhibits normal muscle tone. Coordination normal.  GCS 15. Speech is goal oriented. No cranial nerve deficits appreciated; symmetric eyebrow raise, no facial drooping, tongue midline. Patient has equal grip strength bilaterally with 5/5 strength against resistance in all major muscle groups bilaterally. Sensation to light touch intact. Patient moves extremities without ataxia. Patient ambulatory with steady gait.  Skin: Skin is warm and dry. No rash noted. She is not diaphoretic. No erythema. No pallor.  Psychiatric: She has a normal mood and affect. Her behavior is normal.  Nursing note and vitals reviewed.    ED Treatments / Results  Labs (all labs ordered are listed, but only abnormal results are displayed) Labs Reviewed - No data to display  EKG  EKG Interpretation None       Radiology No results found.  Procedures Procedures (including critical care time)  Medications Ordered in ED Medications  ketorolac (TORADOL) 30 MG/ML injection 30 mg (30 mg Intravenous Given 06/05/17 2300)  metoCLOPramide (REGLAN) injection 10 mg (10 mg Intravenous Given 06/05/17 2300)  sodium chloride 0.9 % bolus 1,000 mL (1,000 mLs Intravenous New Bag/Given 06/05/17 2300)     Initial Impression / Assessment and Plan / ED Course  I have reviewed the triage vital signs and the nursing notes.  Pertinent labs & imaging results that were available during my care of the patient were reviewed by me and considered in my medical decision making (see chart for details).     21 year old female presents to the emergency department for a headache. She states that her headache feels similar to prior headaches which she has had. She notes a left parietal headache with associated photophobia. Symptoms unrelieved by home Motrin. No recent head injury or trauma. Patient afebrile with no nuchal rigidity or meningismus. She has a nonfocal  neurologic exam. Doubt emergent intracranial process.   On repeat assessment, headache has resolved following migraine cocktail. I have encouraged patient to follow-up with a neurologist. Consult placed to care management as well as patient reports barriers to care due to lack of insurance. Plans for future CM contact as their service is not available at this hour on the weekend. Patient made aware of this and verbalizes understanding. Return precautions discussed and provided. Patient discharged in stable condition with no unaddressed concerns.  Vitals:   06/05/17 2046 06/05/17 2048 06/05/17 2145  BP: (!) 128/91  108/78  Pulse: 90  (!) 59  Resp: 16  12  Temp: 98 F (36.7 C)    TempSrc: Oral    SpO2: 99%  99%  Weight:  49.9 kg (110 lb)   Height:  5\' 5"  (1.651 m)     Final Clinical Impressions(s) / ED Diagnoses   Final diagnoses:  Other migraine without status migrainosus, not intractable    New Prescriptions New Prescriptions   BUTALBITAL-ACETAMINOPHEN-CAFFEINE (FIORICET, ESGIC) 50-325-40 MG TABLET    Take 1-2 tablets by mouth every 8 (eight) hours as needed for headache.     Antonietta Breach, PA-C 06/05/17 2354    Merrily Pew, MD 06/05/17 [phone]

## 2017-07-30 IMAGING — DX DG ANKLE COMPLETE 3+V*L*
3 series · 3 of 3 positions shown · non-contrast
Comparison: None.

CLINICAL DATA: Twisted left ankle today.

EXAM:
LEFT ANKLE COMPLETE - 3+ VIEW

[ankle ap]
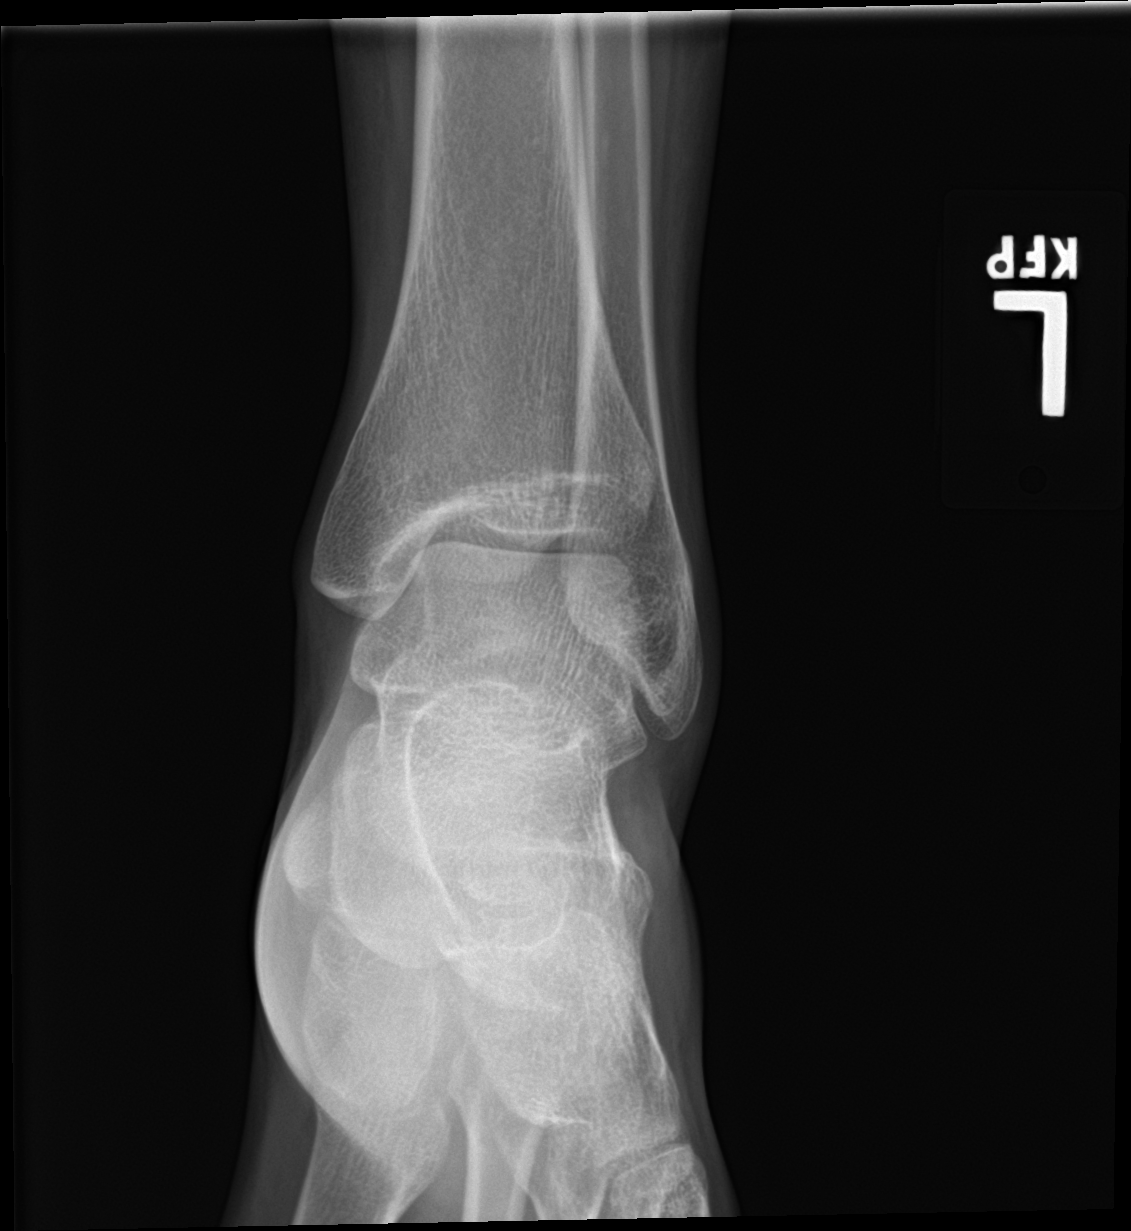

[ankle obl]
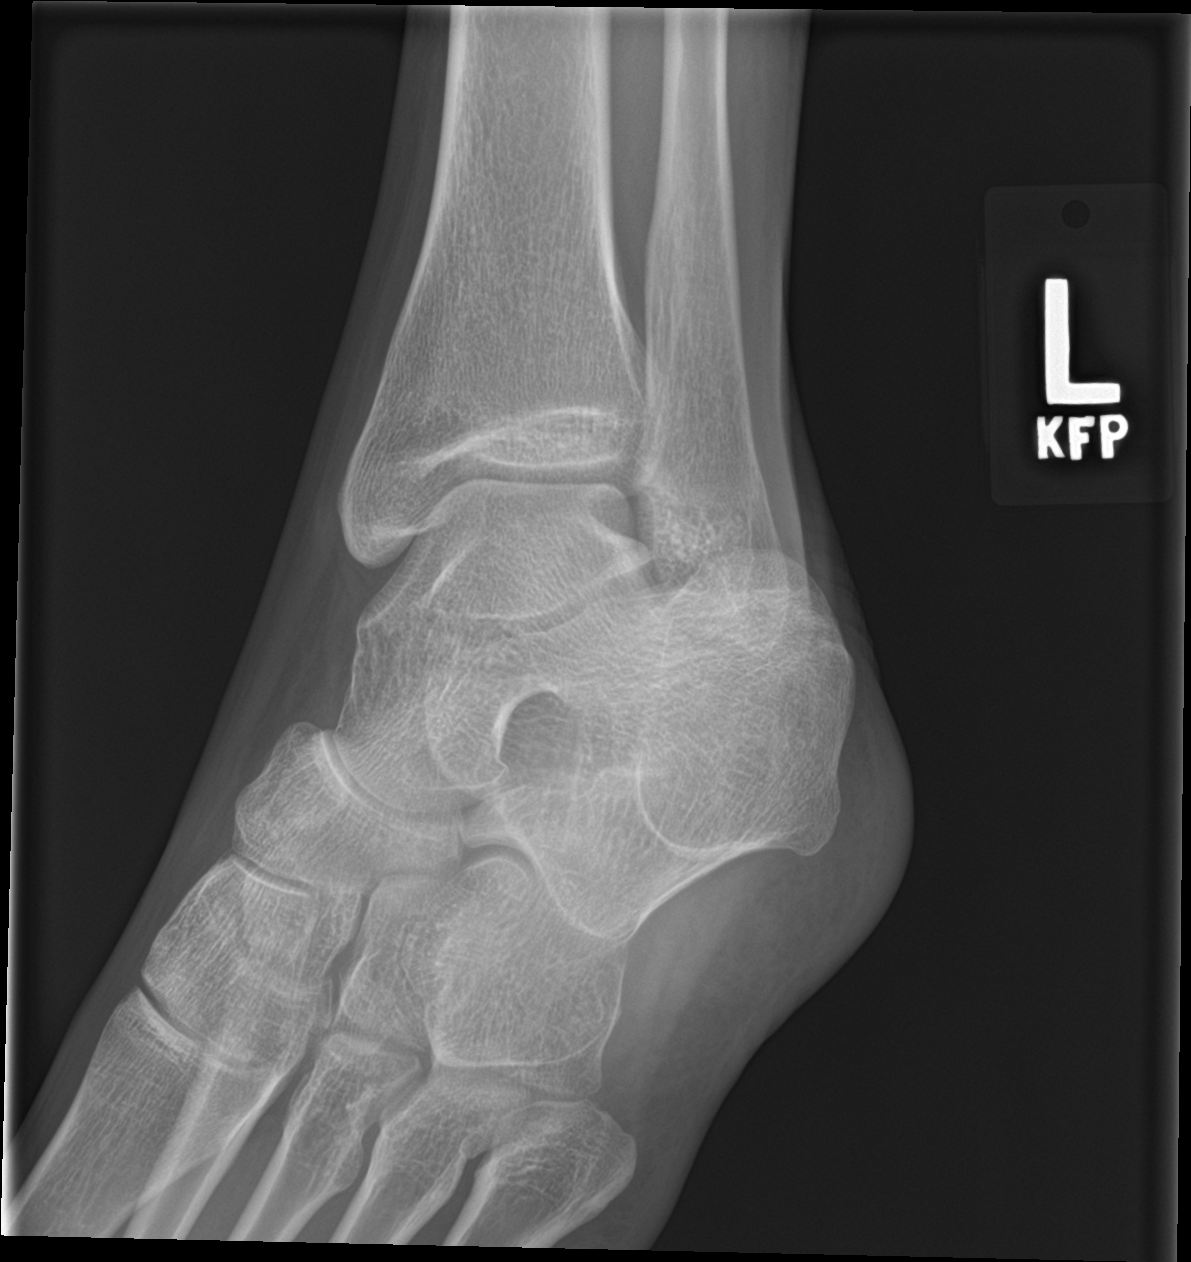

[ankle lat]
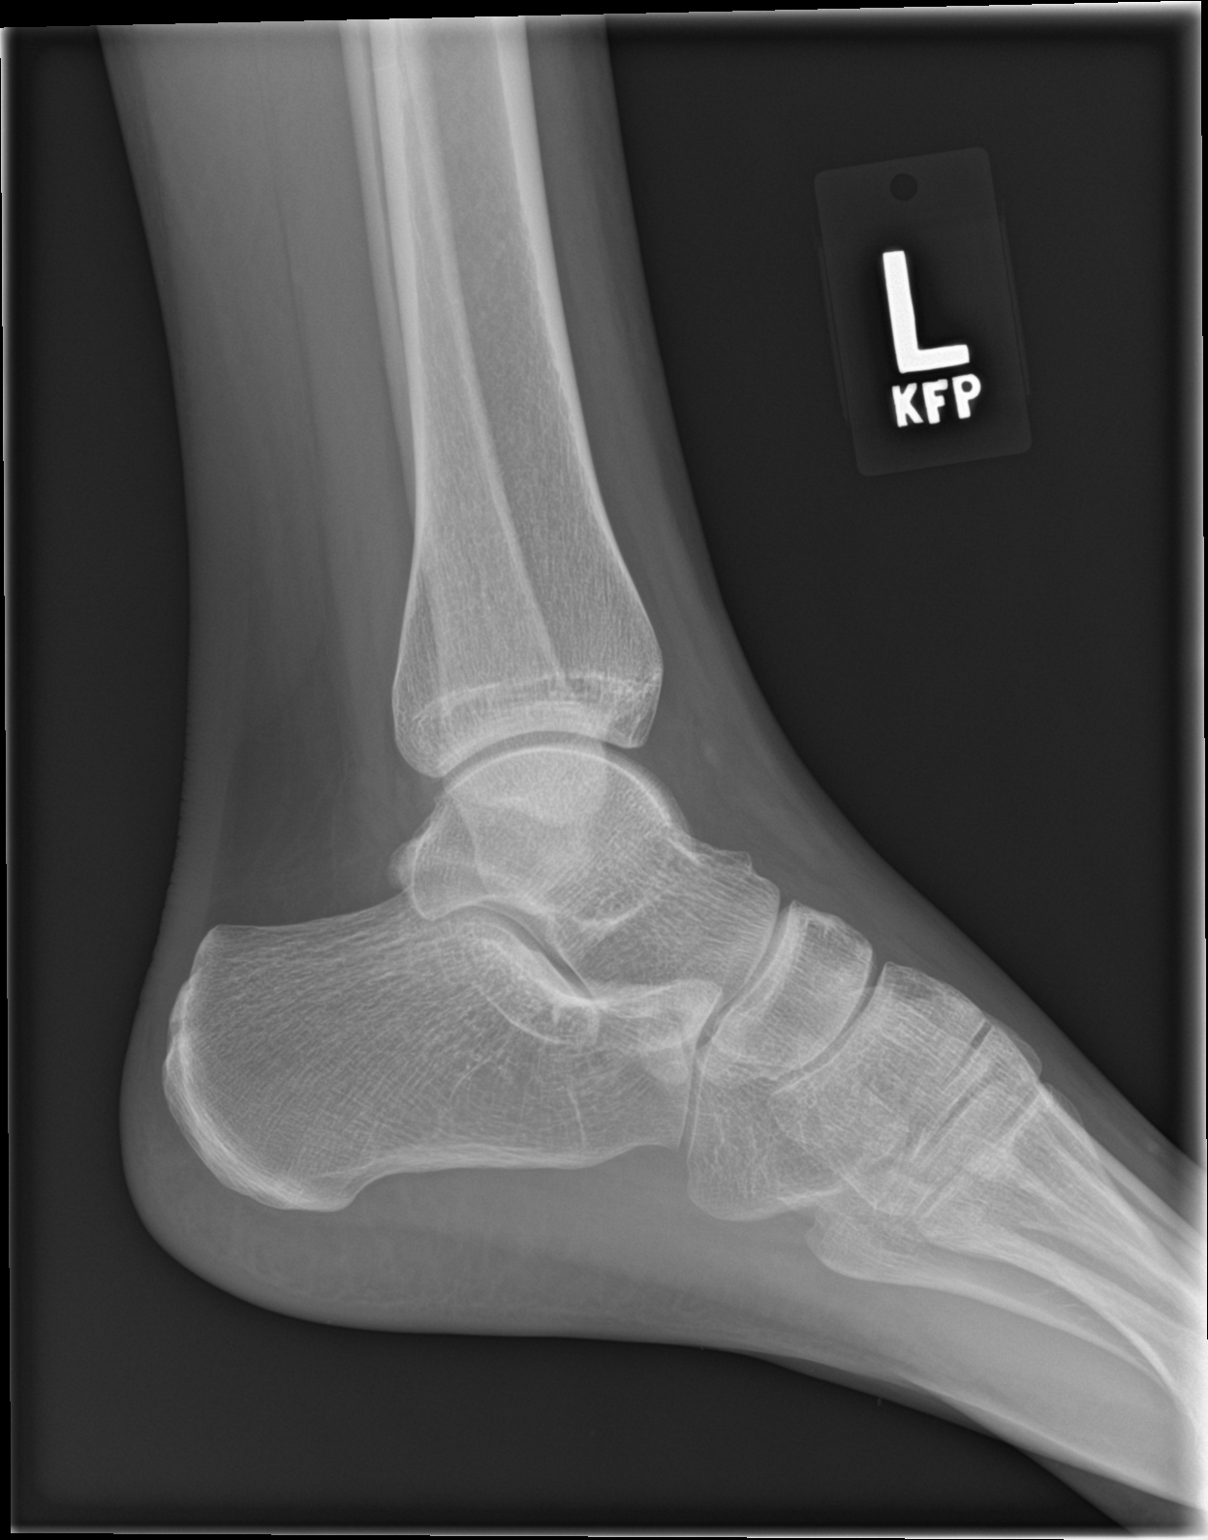

[3 of 3 positions shown; findings below may reference images not displayed]

FINDINGS: There is no evidence of fracture, dislocation, or joint effusion.
There is no evidence of arthropathy or other focal bone abnormality.
Soft tissues are unremarkable.
IMPRESSION: Negative.

## 2017-09-27 ENCOUNTER — Other Ambulatory Visit: Payer: Self-pay

## 2017-09-27 ENCOUNTER — Encounter (HOSPITAL_COMMUNITY): Payer: Self-pay | Admitting: Emergency Medicine

## 2017-09-27 ENCOUNTER — Emergency Department (HOSPITAL_COMMUNITY)
Admission: EM | Admit: 2017-09-27 | Discharge: 2017-09-27 | Disposition: A | Discharge status: Home / Self Care | Payer: Self-pay | Attending: Emergency Medicine | Admitting: Emergency Medicine

## 2017-09-27 DIAGNOSIS — J45909 Unspecified asthma, uncomplicated: Secondary | ICD-10-CM | POA: Insufficient documentation

## 2017-09-27 DIAGNOSIS — R519 Headache, unspecified: Secondary | ICD-10-CM

## 2017-09-27 DIAGNOSIS — R51 Headache: Secondary | ICD-10-CM | POA: Insufficient documentation

## 2017-09-27 DIAGNOSIS — F17210 Nicotine dependence, cigarettes, uncomplicated: Secondary | ICD-10-CM | POA: Insufficient documentation

## 2017-09-27 DIAGNOSIS — K0889 Other specified disorders of teeth and supporting structures: Secondary | ICD-10-CM | POA: Insufficient documentation

## 2017-09-27 MED ORDER — DEXAMETHASONE SODIUM PHOSPHATE 10 MG/ML IJ SOLN
10.0000 mg | Freq: Once | INTRAMUSCULAR | Status: AC
  Administered 2017-09-27: 10 mg via INTRAVENOUS
  Filled 2017-09-27: qty 1

## 2017-09-27 MED ORDER — PENICILLIN V POTASSIUM 500 MG PO TABS: 500.0000 mg | ORAL_TABLET | Freq: Three times a day (TID) | ORAL | 0 refills | Status: DC

## 2017-09-27 MED ORDER — DIPHENHYDRAMINE HCL 50 MG/ML IJ SOLN
12.5000 mg | Freq: Once | INTRAMUSCULAR | Status: AC
  Administered 2017-09-27: 12.5 mg via INTRAVENOUS
  Filled 2017-09-27: qty 1

## 2017-09-27 MED ORDER — METOCLOPRAMIDE HCL 5 MG/ML IJ SOLN
10.0000 mg | Freq: Once | INTRAMUSCULAR | Status: AC
  Administered 2017-09-27: 10 mg via INTRAVENOUS
  Filled 2017-09-27: qty 2

## 2017-09-27 MED ORDER — IBUPROFEN 600 MG PO TABS: 600.0000 mg | ORAL_TABLET | Freq: Four times a day (QID) | ORAL | 0 refills | Status: DC | PRN

## 2017-09-27 NOTE — ED Provider Notes (Signed)
[facility] DEPT Provider Note   CSN: [id number] Arrival date & time: 09/27/17  6712     History   Chief Complaint Chief Complaint  Patient presents with  . Migraine    HPI Stacy Warner is a 22 y.o. female.  Patient presents with headache that started 2 days ago like previous headaches. It is located behind the left eye, extends to lower face, and backward to neck. No visual changes, fever, congestion, sore throat or ear pain. She has a tooth that is broken and starting to cause pain but she reports it is located on the right side. She states light makes the headache worse and it is associated with nausea and vomiting.    The history is provided by the patient. No language interpreter was used.    Past Medical History:  Diagnosis Date  . Anemia   . Asthma   . Chlamydia    age 6  . Infection    UTI    Patient Active Problem List   Diagnosis Date Noted  . Menorrhagia 05/21/2016  . Encounter for immunization 04/23/2016  . Tobacco abuse 04/23/2016  . Marijuana abuse 04/23/2016  . Contraceptive education 04/23/2016  . Iron deficiency anemia 04/18/2016    Class: Acute  . Thrombocytosis ([place]) 04/18/2016    Class: Acute    Past Surgical History:  Procedure Laterality Date  . TONSILLECTOMY      OB History    Gravida Para Term Preterm AB Living   0             SAB TAB Ectopic Multiple Live Births                   Home Medications    Prior to Admission medications   Medication Sig Start Date End Date Taking? Authorizing Provider  acetaminophen (TYLENOL) 500 MG tablet Take 500 mg by mouth every 6 (six) hours as needed for mild pain, moderate pain or headache.   Yes [provider]  butalbital-acetaminophen-caffeine (FIORICET, ESGIC) 50-325-40 MG tablet Take 1-2 tablets by mouth every 8 (eight) hours as needed for headache. Patient not taking: Reported on 09/27/2017 06/05/17 06/05/18  Antonietta Breach, PA-C    Family  History Family History  Problem Relation Age of Onset  . Heart disease Paternal Grandmother   . Stroke Paternal Grandmother   . Thyroid disease Paternal Grandfather     Social History Social History   Tobacco Use  . Smoking status: Current Every Day Smoker    Packs/day: 0.25    Years: 1.00    Pack years: 0.25    Types: Cigars  . Smokeless tobacco: Never Used  . Tobacco comment: black and mild  Substance Use Topics  . Alcohol use: No    Comment: occasional  . Drug use: Yes    Types: Marijuana    Comment: occasional     Allergies   Patient has no known allergies.   Review of Systems Review of Systems  Constitutional: Negative for chills and fever.  HENT: Positive for dental problem. Negative for facial swelling.   Eyes: Positive for photophobia. Negative for visual disturbance.  Respiratory: Negative.   Cardiovascular: Negative.   Gastrointestinal: Positive for nausea and vomiting.  Musculoskeletal: Negative.  Negative for neck stiffness.  Skin: Negative.   Neurological: Positive for headaches.     Physical Exam Updated Vital Signs BP (!) 128/91 (BP Location: Left Arm)   Pulse 79   Temp (!) 97.4 F (36.3  C) (Oral)   Resp 17   Ht 5\' 4"  (1.626 m)   Wt 49.9 kg (110 lb)   LMP 09/06/2017   SpO2 96%   BMI 18.88 kg/m   Physical Exam  Constitutional: She is oriented to person, place, and time. She appears well-developed and well-nourished.  HENT:  Head: Normocephalic.  Mouth/Throat: Oropharynx is clear and moist.  Lower right rear molar partially absent. No surrounding swelling or visualized abscess. No facial swelling.  Neck: Normal range of motion. Neck supple.  Cardiovascular: Normal rate and regular rhythm.  Pulmonary/Chest: Effort normal and breath sounds normal. She has no wheezes.  Abdominal: Soft. Bowel sounds are normal. There is no tenderness. There is no rebound and no guarding.  Musculoskeletal: Normal range of motion.  Neurological: She is  alert and oriented to person, place, and time.  CN's 3-12 grossly intact. Speech is clear and focused. No facial asymmetry. No lateralizing weakness. Reflexes are equal. No deficits of coordination. Ambulatory without imbalance.    Skin: Skin is warm and dry. No rash noted.  Psychiatric: She has a normal mood and affect.     ED Treatments / Results  Labs (all labs ordered are listed, but only abnormal results are displayed) Labs Reviewed - No data to display  EKG  EKG Interpretation None       Radiology No results found.  Procedures Procedures (including critical care time)  Medications Ordered in ED Medications  metoCLOPramide (REGLAN) injection 10 mg (10 mg Intravenous Given 09/27/17 0613)  diphenhydrAMINE (BENADRYL) injection 12.5 mg (12.5 mg Intravenous Given 09/27/17 0612)  dexamethasone (DECADRON) injection 10 mg (10 mg Intravenous Given 09/27/17 0017)     Initial Impression / Assessment and Plan / ED Course  I have reviewed the triage vital signs and the nursing notes.  Pertinent labs & imaging results that were available during my care of the patient were reviewed by me and considered in my medical decision making (see chart for details).     Patient with a headache similar to her previous headaches x 2 days. Normal, nonfocal neuro exam. Presentation consistent with migraine.   She has dental pain on right molar without visualized abscess. Will cover with abx and encourage dental follow up.  Migraine cocktail provided with resolution of headache and nausea. She is feeling much better and is felt appropriate for discharge home.   Final Clinical Impressions(s) / ED Diagnoses   Final diagnoses:  None   1. Headache 2. Dental pain  ED Discharge Orders    None       Charlann Lange, Hershal Coria 09/27/17 2229    Orpah Greek, MD 09/28/17 [phone]

## 2017-09-27 NOTE — ED Triage Notes (Signed)
Pt reports having headache for the last 2 days along with nausea and vomiting. Pt states pain is on the left side of head going down neck.

## 2017-09-27 NOTE — Discharge Instructions (Signed)
Follow up with a dentist of your choice for further management of dental pain. Take ibuprofen and penicillin as prescribed.

## 2017-11-11 ENCOUNTER — Emergency Department (HOSPITAL_COMMUNITY)
Admission: EM | Admit: 2017-11-11 | Discharge: 2017-11-11 | Disposition: A | Discharge status: Home / Self Care | Payer: Self-pay | Attending: Emergency Medicine | Admitting: Emergency Medicine

## 2017-11-11 DIAGNOSIS — Z5321 Procedure and treatment not carried out due to patient leaving prior to being seen by health care provider: Secondary | ICD-10-CM | POA: Insufficient documentation

## 2017-11-11 DIAGNOSIS — K0889 Other specified disorders of teeth and supporting structures: Secondary | ICD-10-CM | POA: Insufficient documentation

## 2017-11-11 MED ORDER — IBUPROFEN 400 MG PO TABS
400.0000 mg | ORAL_TABLET | Freq: Once | ORAL | Status: AC | PRN
  Administered 2017-11-11: 400 mg via ORAL
  Filled 2017-11-11: qty 1

## 2017-11-11 NOTE — ED Notes (Signed)
Pt states that sh is leaving due to wait times and will return tomorrow

## 2017-11-11 NOTE — ED Triage Notes (Signed)
Pt reports R lower dental pain X few days, pt states her tooth broke off a year ago and has not had the money to get it fixed. Pt reports 10/10 pain that is causing HA.

## 2017-11-29 ENCOUNTER — Other Ambulatory Visit: Payer: Self-pay

## 2017-11-29 ENCOUNTER — Encounter (HOSPITAL_COMMUNITY): Payer: Self-pay | Admitting: Emergency Medicine

## 2017-11-29 DIAGNOSIS — R1032 Left lower quadrant pain: Secondary | ICD-10-CM | POA: Insufficient documentation

## 2017-11-29 DIAGNOSIS — Z5321 Procedure and treatment not carried out due to patient leaving prior to being seen by health care provider: Secondary | ICD-10-CM | POA: Insufficient documentation

## 2017-11-29 DIAGNOSIS — R110 Nausea: Secondary | ICD-10-CM | POA: Insufficient documentation

## 2017-11-29 NOTE — ED Triage Notes (Signed)
Pt c/o LLQ pain x 2-3 days, +nausea. Denies gyn s/s, denies urinary s/s

## 2017-11-30 ENCOUNTER — Emergency Department (HOSPITAL_COMMUNITY)
Admission: EM | Admit: 2017-11-30 | Discharge: 2017-11-30 | Disposition: A | Discharge status: Home / Self Care | Payer: Self-pay | Attending: Emergency Medicine | Admitting: Emergency Medicine

## 2017-11-30 LAB — COMPREHENSIVE METABOLIC PANEL
ALBUMIN: 3.7 g/dL (ref 3.5–5.0)
ALT: 9 U/L — ABNORMAL LOW (ref 14–54)
ANION GAP: 12 (ref 5–15)
AST: 17 U/L (ref 15–41)
Alkaline Phosphatase: 61 U/L (ref 38–126)
BUN: 5 mg/dL — ABNORMAL LOW (ref 6–20)
CHLORIDE: 104 mmol/L (ref 101–111)
CO2: 20 mmol/L — AB (ref 22–32)
Calcium: 9.5 mg/dL (ref 8.9–10.3)
Creatinine, Ser: 0.84 mg/dL (ref 0.44–1.00)
GFR calc Af Amer: >60 mL/min (ref >60)
GFR calc non Af Amer: >60 mL/min (ref >60)
GLUCOSE: 89 mg/dL (ref 65–99)
POTASSIUM: 3.6 mmol/L (ref 3.5–5.1)
SODIUM: 136 mmol/L (ref 135–145)
Total Bilirubin: 0.5 mg/dL (ref 0.3–1.2)
Total Protein: 7.4 g/dL (ref 6.5–8.1)

## 2017-11-30 LAB — URINALYSIS, ROUTINE W REFLEX MICROSCOPIC
Bilirubin Urine: NEGATIVE
Glucose, UA: NEGATIVE mg/dL
Ketones, ur: NEGATIVE mg/dL
NITRITE: NEGATIVE
PROTEIN: NEGATIVE mg/dL
Specific Gravity, Urine: 1.009 (ref 1.005–1.030)
pH: 6 (ref 5.0–8.0)

## 2017-11-30 LAB — CBC
HEMATOCRIT: 39.8 % (ref 36.0–46.0)
HEMOGLOBIN: 13.4 g/dL (ref 12.0–15.0)
MCH: 30.2 pg (ref 26.0–34.0)
MCHC: 33.7 g/dL (ref 30.0–36.0)
MCV: 89.6 fL (ref 78.0–100.0)
Platelets: 301 10*3/uL (ref 150–400)
RBC: 4.44 MIL/uL (ref 3.87–5.11)
RDW: 14.2 % (ref 11.5–15.5)
WBC: 11.7 10*3/uL — ABNORMAL HIGH (ref 4.0–10.5)

## 2017-11-30 LAB — I-STAT BETA HCG BLOOD, ED (MC, WL, AP ONLY)

## 2017-11-30 LAB — LIPASE, BLOOD: Lipase: 26 U/L (ref 11–51)

## 2017-11-30 NOTE — ED Notes (Signed)
Pt came up to desk and asked how much longer. Informed pt that she was the longest wait and the next to go back. Pt said that she was going to go and check with her ride and didn't see pt come back inside.

## 2017-12-01 NOTE — ED Notes (Addendum)
12/01/2017, Follow-up call completed, Pt. Very thankful for the call.

## 2018-03-24 ENCOUNTER — Other Ambulatory Visit: Payer: Self-pay

## 2018-03-24 ENCOUNTER — Emergency Department (HOSPITAL_BASED_OUTPATIENT_CLINIC_OR_DEPARTMENT_OTHER): Payer: Self-pay

## 2018-03-24 ENCOUNTER — Encounter (HOSPITAL_BASED_OUTPATIENT_CLINIC_OR_DEPARTMENT_OTHER): Payer: Self-pay | Admitting: *Deleted

## 2018-03-24 ENCOUNTER — Emergency Department (HOSPITAL_BASED_OUTPATIENT_CLINIC_OR_DEPARTMENT_OTHER)
Admission: EM | Admit: 2018-03-24 | Discharge: 2018-03-24 | Disposition: A | Discharge status: Home / Self Care | Payer: Self-pay | Attending: Emergency Medicine | Admitting: Emergency Medicine

## 2018-03-24 DIAGNOSIS — F17290 Nicotine dependence, other tobacco product, uncomplicated: Secondary | ICD-10-CM | POA: Insufficient documentation

## 2018-03-24 DIAGNOSIS — R112 Nausea with vomiting, unspecified: Secondary | ICD-10-CM | POA: Insufficient documentation

## 2018-03-24 DIAGNOSIS — J45909 Unspecified asthma, uncomplicated: Secondary | ICD-10-CM | POA: Insufficient documentation

## 2018-03-24 DIAGNOSIS — Z79899 Other long term (current) drug therapy: Secondary | ICD-10-CM | POA: Insufficient documentation

## 2018-03-24 DIAGNOSIS — N12 Tubulo-interstitial nephritis, not specified as acute or chronic: Secondary | ICD-10-CM | POA: Insufficient documentation

## 2018-03-24 LAB — CBC
HCT: 39 % (ref 36.0–46.0)
HEMOGLOBIN: 13.4 g/dL (ref 12.0–15.0)
MCH: 30.2 pg (ref 26.0–34.0)
MCHC: 34.4 g/dL (ref 30.0–36.0)
MCV: 88 fL (ref 78.0–100.0)
Platelets: 287 10*3/uL (ref 150–400)
RBC: 4.43 MIL/uL (ref 3.87–5.11)
RDW: 14.8 % (ref 11.5–15.5)
WBC: 13.4 10*3/uL — ABNORMAL HIGH (ref 4.0–10.5)

## 2018-03-24 LAB — COMPREHENSIVE METABOLIC PANEL
ALBUMIN: 3.6 g/dL (ref 3.5–5.0)
ALK PHOS: 57 U/L (ref 38–126)
ALT: 12 U/L (ref 0–44)
ANION GAP: 10 (ref 5–15)
AST: 23 U/L (ref 15–41)
BUN: 8 mg/dL (ref 6–20)
CALCIUM: 8.9 mg/dL (ref 8.9–10.3)
CHLORIDE: 103 mmol/L (ref 98–111)
CO2: 24 mmol/L (ref 22–32)
CREATININE: 0.94 mg/dL (ref 0.44–1.00)
GFR calc Af Amer: >60 mL/min (ref >60)
GFR calc non Af Amer: >60 mL/min (ref >60)
GLUCOSE: 109 mg/dL — AB (ref 70–99)
Potassium: 3.2 mmol/L — ABNORMAL LOW (ref 3.5–5.1)
SODIUM: 137 mmol/L (ref 135–145)
Total Bilirubin: 0.4 mg/dL (ref 0.3–1.2)
Total Protein: 7.5 g/dL (ref 6.5–8.1)

## 2018-03-24 LAB — LIPASE, BLOOD: LIPASE: 32 U/L (ref 11–51)

## 2018-03-24 LAB — URINALYSIS, ROUTINE W REFLEX MICROSCOPIC
BILIRUBIN URINE: NEGATIVE
GLUCOSE, UA: NEGATIVE mg/dL
Ketones, ur: 15 mg/dL — AB
Nitrite: NEGATIVE
Protein, ur: 30 mg/dL — AB
SPECIFIC GRAVITY, URINE: 1.015 (ref 1.005–1.030)
pH: 6 (ref 5.0–8.0)

## 2018-03-24 LAB — URINALYSIS, MICROSCOPIC (REFLEX)

## 2018-03-24 LAB — PREGNANCY, URINE: Preg Test, Ur: NEGATIVE

## 2018-03-24 MED ORDER — SODIUM CHLORIDE 0.9 % IV SOLN
1.0000 g | Freq: Once | INTRAVENOUS | Status: AC
  Administered 2018-03-24: 1 g via INTRAVENOUS
  Filled 2018-03-24: qty 10

## 2018-03-24 MED ORDER — CEPHALEXIN 500 MG PO CAPS: 500.0000 mg | ORAL_CAPSULE | Freq: Four times a day (QID) | ORAL | 0 refills | Status: DC

## 2018-03-24 MED ORDER — ACETAMINOPHEN 325 MG PO TABS
650.0000 mg | ORAL_TABLET | Freq: Once | ORAL | Status: AC
  Administered 2018-03-24: 650 mg via ORAL
  Filled 2018-03-24: qty 2

## 2018-03-24 MED ORDER — FENTANYL CITRATE (PF) 100 MCG/2ML IJ SOLN
50.0000 ug | Freq: Once | INTRAMUSCULAR | Status: AC
  Administered 2018-03-24: 50 ug via INTRAVENOUS
  Filled 2018-03-24: qty 2

## 2018-03-24 MED ORDER — CEPHALEXIN 500 MG PO CAPS: 500.0000 mg | ORAL_CAPSULE | Freq: Two times a day (BID) | ORAL | 0 refills | Status: DC

## 2018-03-24 MED ORDER — SODIUM CHLORIDE 0.9 % IV SOLN
INTRAVENOUS | Status: DC | PRN
  Administered 2018-03-24: 18:00:00 via INTRAVENOUS

## 2018-03-24 MED ORDER — ONDANSETRON HCL 4 MG/2ML IJ SOLN
4.0000 mg | Freq: Once | INTRAMUSCULAR | Status: AC
  Administered 2018-03-24: 4 mg via INTRAVENOUS
  Filled 2018-03-24: qty 2

## 2018-03-24 MED ORDER — IOPAMIDOL (ISOVUE-300) INJECTION 61%
100.0000 mL | Freq: Once | INTRAVENOUS | Status: AC | PRN
  Administered 2018-03-24: 100 mL via INTRAVENOUS

## 2018-03-24 MED ORDER — SODIUM CHLORIDE 0.9 % IV BOLUS
1000.0000 mL | Freq: Once | INTRAVENOUS | Status: AC
  Administered 2018-03-24: 1000 mL via INTRAVENOUS

## 2018-03-24 MED ORDER — ONDANSETRON 4 MG PO TBDP: 4.0000 mg | ORAL_TABLET | Freq: Three times a day (TID) | ORAL | 0 refills | Status: DC | PRN

## 2018-03-24 NOTE — ED Notes (Signed)
Pt. To have her brother come and get her due to pain meds being given

## 2018-03-24 NOTE — Discharge Instructions (Signed)
Please read and follow all provided instructions.  Your diagnoses today include:  1. Pyelonephritis     Tests performed today include:  Blood counts and electrolytes  Blood tests to check liver and kidney function  Blood tests to check pancreas function  Urine test to look for infection and pregnancy (in women)  CT scan - shows signs of infection in the right kidney  Vital signs. See below for your results today.   Medications prescribed:   Keflex (cephalexin) - antibiotic  You have been prescribed an antibiotic medicine: take the entire course of medicine even if you are feeling better. Stopping early can cause the antibiotic not to work.   Zofran (ondansetron) - for nausea and vomiting  Take any prescribed medications only as directed.  Home care instructions:   Follow any educational materials contained in this packet.  Follow-up instructions: Please follow-up with your primary care provider in the next 3 days for further evaluation of your symptoms.    Return instructions:  SEEK IMMEDIATE MEDICAL ATTENTION IF:  The pain does not go away or becomes severe   A temperature above 101F develops   Repeated vomiting occurs (multiple episodes)   The pain becomes localized to portions of the abdomen. The right side could possibly be appendicitis. In an adult, the left lower portion of the abdomen could be colitis or diverticulitis.   Blood is being passed in stools or vomit (bright red or black tarry stools)   You develop chest pain, difficulty breathing, dizziness or fainting, or become confused, poorly responsive, or inconsolable (young children)  If you have any other emergent concerns regarding your health  Your vital signs today were: BP 115/72 (BP Location: Right Arm)    Pulse 100    Temp (!) 101.1 F (38.4 C) (Oral)    Resp 18    Ht 5\' 4"  (1.626 m)    Wt 50.8 kg (112 lb)    LMP 02/22/2018    SpO2 98%    BMI 19.22 kg/m  If your blood pressure (bp) was  elevated above 135/85 this visit, please have this repeated by your doctor within one month. --------------

## 2018-03-24 NOTE — ED Triage Notes (Signed)
Right lateral abdominal pain x 2 days. No injury.

## 2018-03-24 NOTE — ED Provider Notes (Signed)
[facility] EMERGENCY DEPARTMENT Provider Note   CSN: [id number] Arrival date & time: 03/24/18  1220     History   Chief Complaint Chief Complaint  Patient presents with  . Abdominal Pain    HPI Stacy Warner is a 22 y.o. female.  Patient with no surgical history presents to the emergency department with complaint of right lateral abdominal and back pain over the past 3 days.  She is also had several episodes of vomiting.  No fevers reported.  Pain is gradually worsening.  Pain is worse with movement.  Patient reports decreased appetite.  No diarrhea, dysuria, hematuria, or increased frequency urgency.  No vaginal bleeding or discharge.  Onset of symptoms acute.  Pain is made worse with movement and palpation.     Past Medical History:  Diagnosis Date  . Anemia   . Asthma   . Chlamydia    age 48  . Infection    UTI    Patient Active Problem List   Diagnosis Date Noted  . Menorrhagia 05/21/2016  . Encounter for immunization 04/23/2016  . Tobacco abuse 04/23/2016  . Marijuana abuse 04/23/2016  . Contraceptive education 04/23/2016  . Iron deficiency anemia 04/18/2016    Class: Acute  . Thrombocytosis ([place]) 04/18/2016    Class: Acute    Past Surgical History:  Procedure Laterality Date  . TONSILLECTOMY       OB History    Gravida  0   Para      Term      Preterm      AB      Living        SAB      TAB      Ectopic      Multiple      Live Births               Home Medications    Prior to Admission medications   Medication Sig Start Date End Date Taking? Authorizing Provider  acetaminophen (TYLENOL) 500 MG tablet Take 500 mg by mouth every 6 (six) hours as needed for mild pain, moderate pain or headache.    [provider]  butalbital-acetaminophen-caffeine (FIORICET, ESGIC) 50-325-40 MG tablet Take 1-2 tablets by mouth every 8 (eight) hours as needed for headache. Patient not taking: Reported on 09/27/2017 06/05/17  06/05/18  Antonietta Breach, PA-C  ibuprofen (ADVIL,MOTRIN) 600 MG tablet Take 1 tablet (600 mg total) by mouth every 6 (six) hours as needed. 09/27/17   Charlann Lange, PA-C  penicillin v potassium (VEETID) 500 MG tablet Take 1 tablet (500 mg total) by mouth 3 (three) times daily. 09/27/17   Charlann Lange, PA-C    Family History Family History  Problem Relation Age of Onset  . Heart disease Paternal Grandmother   . Stroke Paternal Grandmother   . Thyroid disease Paternal Grandfather     Social History Social History   Tobacco Use  . Smoking status: Current Every Day Smoker    Packs/day: 0.25    Years: 1.00    Pack years: 0.25    Types: Cigars  . Smokeless tobacco: Never Used  . Tobacco comment: black and mild  Substance Use Topics  . Alcohol use: No    Comment: occasional  . Drug use: Yes    Types: Marijuana    Comment: occasional     Allergies   Patient has no known allergies.   Review of Systems Review of Systems  Constitutional: Negative for fever.  HENT: Negative for rhinorrhea and sore throat.   Eyes: Negative for redness.  Respiratory: Negative for cough.   Cardiovascular: Negative for chest pain.  Gastrointestinal: Positive for abdominal pain, nausea and vomiting. Negative for diarrhea.  Genitourinary: Positive for flank pain. Negative for dysuria, frequency and urgency.  Musculoskeletal: Positive for back pain. Negative for myalgias.  Skin: Negative for rash.  Neurological: Negative for headaches.     Physical Exam Updated Vital Signs BP 116/76   Pulse (!) 108   Temp 98.7 F (37.1 C) (Oral)   Resp 16   Ht 5\' 4"  (1.626 m)   Wt 50.8 kg (112 lb)   LMP 02/22/2018   SpO2 100%   BMI 19.22 kg/m   Physical Exam  Constitutional: She appears well-developed and well-nourished.  HENT:  Head: Normocephalic and atraumatic.  Eyes: Conjunctivae are normal. Right eye exhibits no discharge. Left eye exhibits no discharge.  Neck: Normal range of motion. Neck  supple.  Cardiovascular: Normal rate, regular rhythm and normal heart sounds.  Pulmonary/Chest: Effort normal and breath sounds normal.  Abdominal: Soft. There is tenderness (Tenderness over the lateral aspect of the right abdomen.,  Milder right lower quadrant pain) in the right lower quadrant. There is CVA tenderness. There is no rebound and no guarding.  Neurological: She is alert.  Skin: Skin is warm and dry.  Psychiatric: She has a normal mood and affect.  Nursing note and vitals reviewed.    ED Treatments / Results  Labs (all labs ordered are listed, but only abnormal results are displayed) Labs Reviewed  COMPREHENSIVE METABOLIC PANEL - Abnormal; Notable for the following components:      Result Value   Potassium 3.2 (*)    Glucose, Bld 109 (*)    All other components within normal limits  CBC - Abnormal; Notable for the following components:   WBC 13.4 (*)    All other components within normal limits  URINALYSIS, ROUTINE W REFLEX MICROSCOPIC - Abnormal; Notable for the following components:   Hgb urine dipstick LARGE (*)    Ketones, ur 15 (*)    Protein, ur 30 (*)    Leukocytes, UA SMALL (*)    All other components within normal limits  URINALYSIS, MICROSCOPIC (REFLEX) - Abnormal; Notable for the following components:   Bacteria, UA MANY (*)    All other components within normal limits  URINE CULTURE  LIPASE, BLOOD  PREGNANCY, URINE    EKG None  Radiology Ct Abdomen Pelvis W Contrast  Result Date: 03/24/2018 CLINICAL DATA:  Right sided abdominal pain for the past 2 days. EXAM: CT ABDOMEN AND PELVIS WITH CONTRAST TECHNIQUE: Multidetector CT imaging of the abdomen and pelvis was performed using the standard protocol following bolus administration of intravenous contrast. CONTRAST:  146mL ISOVUE-300 IOPAMIDOL (ISOVUE-300) INJECTION 61% COMPARISON:  None. FINDINGS: Lower chest: No acute abnormality. Hepatobiliary: No focal liver abnormality is seen. No gallstones,  gallbladder wall thickening, or biliary dilatation. Pancreas: Unremarkable. No pancreatic ductal dilatation or surrounding inflammatory changes. Spleen: Normal in size without focal abnormality. Adrenals/Urinary Tract: The bilateral adrenal glands are unremarkable. Patchy parenchymal hypodensity in the right kidney. The left kidney is unremarkable. No renal or ureteral calculi. No hydronephrosis. The bladder is unremarkable. Stomach/Bowel: Stomach is within normal limits. Appendix appears normal. No evidence of bowel wall thickening, distention, or inflammatory changes. Vascular/Lymphatic: No significant vascular findings are present. No enlarged abdominal or pelvic lymph nodes. Reproductive: Uterus and bilateral adnexa are unremarkable. Other: Trace free fluid in the pelvis  is likely physiologic. No pneumoperitoneum. Musculoskeletal: No acute or significant osseous findings. IMPRESSION: 1. Right-sided pyelonephritis. Electronically Signed   By: Titus Dubin M.D.   On: 03/24/2018 16:55    Procedures Procedures (including critical care time)  Medications Ordered in ED Medications  fentaNYL (SUBLIMAZE) injection 50 mcg (has no administration in time range)  ondansetron (ZOFRAN) injection 4 mg (has no administration in time range)     Initial Impression / Assessment and Plan / ED Course  I have reviewed the triage vital signs and the nursing notes.  Pertinent labs & imaging results that were available during my care of the patient were reviewed by me and considered in my medical decision making (see chart for details).     Patient seen and examined. Work-up initiated. Medications ordered.   Vital signs reviewed and are as follows: BP 116/76   Pulse (!) 108   Temp 98.7 F (37.1 C) (Oral)   Resp 16   Ht 5\' 4"  (1.626 m)   Wt 50.8 kg (112 lb)   LMP 02/22/2018   SpO2 100%   BMI 19.22 kg/m   2:08 PM initial work-up with leukocytosis.  Exam has signs and symptoms for both appendicitis as  well as pyelonephritis.  Urine does appear infected however patient does not have any supporting urinary symptoms.  I think that imaging in this case is reasonable to delineate between these 2 etiologies.  Discussed with patient who agrees with plan.  Will have CT abdomen and pelvis performed to rule out appendicitis.  If negative, will treat for UTI/Pyelo.   CT scan demonstrates right-sided pyelonephritis.  Patient now with fever.  Patient was given Tylenol, IV fluids, and a dose of IV Rocephin.  Patient rechecked.  She is comfortable discharged home.  She states that she is feeling better.  Tachycardia improved with temperature after administration of Tylenol.  We discussed signs and symptoms which should cause her to return including intractable pain, development of persistent vomiting, worsening or changing symptoms.  She verbalizes understanding and agrees with plan.  Final Clinical Impressions(s) / ED Diagnoses   Final diagnoses:  Pyelonephritis   Patient with back and flank pain.  Urine suggestive of infection and this was confirmed on CT scan.  Patient did develop a fever while in the emergency department.  Her symptoms are well controlled.  She is not vomiting.  She was given a dose of IV antibiotics and fluids with improvement.  She will be discharged home with symptomatic treatment and antibiotics.  Encourage PCP follow-up in the next 3 to 5 days for recheck.  ED Discharge Orders        Ordered    cephALEXin (KEFLEX) 500 MG capsule  4 times daily,   Status:  Discontinued     03/24/18 1832    ondansetron (ZOFRAN ODT) 4 MG disintegrating tablet  Every 8 hours PRN     03/24/18 1832    cephALEXin (KEFLEX) 500 MG capsule  2 times daily     03/24/18 1832       Carlisle Cater, PA-C 03/24/18 1916    Julianne Rice, MD 03/26/18 [phone]

## 2018-03-26 LAB — URINE CULTURE: Culture: >=100000 — AB

## 2018-03-27 ENCOUNTER — Telehealth: Payer: Self-pay

## 2018-03-27 NOTE — Telephone Encounter (Signed)
Post ED Visit - Positive Culture Follow-up  Culture report reviewed by antimicrobial stewardship pharmacist:  []  Elenor Quinones, Pharm.D. []  Heide Guile, Pharm.D., BCPS AQ-ID []  Parks Neptune, Pharm.D., BCPS []  Alycia Rossetti, Pharm.D., BCPS []  [place], [place].D., BCPS, AAHIVP []  Legrand Como, Pharm.D., BCPS, AAHIVP []  Salome Arnt, PharmD, BCPS []  Johnnette Gourd, PharmD, BCPS []  Hughes Better, PharmD, BCPS [x]  Leeroy Cha, PharmD  Positive urine culture Treated with Cephalexin, organism sensitive to the same and no further patient follow-up is required at this time.  Genia Del 03/27/2018, 10:15 AM

## 2018-06-13 ENCOUNTER — Telehealth: Payer: Self-pay | Admitting: *Deleted

## 2018-06-13 NOTE — Telephone Encounter (Signed)
Received a call from [facility] on [address] [facility] requesting a call back to check on the legitimacy of an Rx presented by fax for oxycodone on an Rx listing [facility].  Ms Wherry is not a patient of our clinic ([facility]). I notified the pharmacist and our practice manager Zorita Pang.

## 2019-02-08 ENCOUNTER — Other Ambulatory Visit: Payer: Self-pay | Admitting: *Deleted

## 2019-02-08 DIAGNOSIS — Z20822 Contact with and (suspected) exposure to covid-19: Secondary | ICD-10-CM

## 2019-02-10 LAB — NOVEL CORONAVIRUS, NAA: SARS-CoV-2, NAA: NOT DETECTED

## 2019-09-01 NOTE — L&D Delivery Note (Addendum)
Patient is 24 y.o. G2P0010 [redacted]w[redacted]d admitted for gHTN with preE w/ severe features   Delivery Note At 11:41 AM a viable female was delivered via Vaginal, Spontaneous (Presentation:   ROA).  APGAR: pending, pending; weight  4# 5oz.   Placenta status: Spontaneous, Intact.  Cord: 3 vessels with the following complications: None.   Anesthesia: Epidural Episiotomy: None Lacerations:  sidewall Suture Repair: chromic Est. Blood Loss (mL):  200 cc  Mom to postpartum.  Baby to NICU.  Upon arrival patient was complete and pushing. She pushed with good maternal effort to deliver a baby girl. Baby delivered without difficulty, was noted to have poor tone and delayed cry so cord was cut without delay and baby handed over to the NICU team. Placenta delivered intact with 3V cord. Vaginal canal and perineum was inspected and found to have 2 hemostatic periurethral lacerations and a sidewall laceration. The vaginal sidewall laceration was sutured with good hemostasis. Pitocin was started and uterus massaged some small amount of bleeding persisted and tranexamic acid was given. Counts of sharps, instruments, and lap pads were all correct.     [name], MD PGY-3 8/25/202112:10 PM  OB FELLOW DELIVERY ATTESTATION  I was gloved and present for the delivery in its entirety, and I agree with the above resident's note.    Phill Myron, D.O. OB Fellow  04/25/2020, 9:30 AM

## 2019-10-30 ENCOUNTER — Ambulatory Visit: Payer: Medicaid Other | Admitting: *Deleted

## 2019-10-30 DIAGNOSIS — O0990 Supervision of high risk pregnancy, unspecified, unspecified trimester: Secondary | ICD-10-CM | POA: Insufficient documentation

## 2019-10-30 DIAGNOSIS — Z3480 Encounter for supervision of other normal pregnancy, unspecified trimester: Secondary | ICD-10-CM

## 2019-10-30 DIAGNOSIS — O219 Vomiting of pregnancy, unspecified: Secondary | ICD-10-CM

## 2019-10-30 MED ORDER — PROMETHAZINE HCL 25 MG PO TABS: 25.0000 mg | ORAL_TABLET | Freq: Four times a day (QID) | ORAL | 0 refills | Status: DC | PRN

## 2019-10-30 NOTE — Progress Notes (Signed)
I connected with  [name] on 10/30/19 by a video enabled telemedicine application and verified that I am speaking with the correct person using two identifiers.   I discussed the limitations of evaluation and management by telemedicine. The patient expressed understanding and agreed to proceed.    PRENATAL INTAKE SUMMARY  Ms. Altherr presents today New OB Nurse Interview.  OB History    Gravida  2   Para      Term      Preterm      AB  1   Living        SAB      TAB  1   Ectopic      Multiple      Live Births             I have reviewed the patient's medical, obstetrical, social, and family histories, medications, and available lab results.  SUBJECTIVE She complains of nausea with vomiting- request Rx  OBJECTIVE Initial Televisit Exam (New OB)  GENERAL APPEARANCE: pt sounds appropriate, in no distress   ASSESSMENT Normal pregnancy  PLAN Prenatal Care at [facility] Blood Pressure Cuff ordered Babyscripts ordered Phenergan 25mg  ordered per protocol

## 2019-11-03 ENCOUNTER — Other Ambulatory Visit (HOSPITAL_COMMUNITY)
Admission: RE | Admit: 2019-11-03 | Discharge: 2019-11-03 | Disposition: A | Discharge status: Home / Self Care | Payer: Medicaid Other | Source: Ambulatory Visit | Attending: Obstetrics | Admitting: Obstetrics

## 2019-11-03 ENCOUNTER — Other Ambulatory Visit: Payer: Self-pay

## 2019-11-03 ENCOUNTER — Ambulatory Visit (INDEPENDENT_AMBULATORY_CARE_PROVIDER_SITE_OTHER): Payer: Medicaid Other | Admitting: Obstetrics

## 2019-11-03 ENCOUNTER — Other Ambulatory Visit: Payer: Self-pay | Admitting: Obstetrics

## 2019-11-03 ENCOUNTER — Encounter: Payer: Self-pay | Admitting: Obstetrics

## 2019-11-03 VITALS — BP 115/68 | HR 79 | Wt 132.5 lb

## 2019-11-03 DIAGNOSIS — O99891 Other specified diseases and conditions complicating pregnancy: Secondary | ICD-10-CM

## 2019-11-03 DIAGNOSIS — Z3480 Encounter for supervision of other normal pregnancy, unspecified trimester: Secondary | ICD-10-CM | POA: Insufficient documentation

## 2019-11-03 DIAGNOSIS — J4520 Mild intermittent asthma, uncomplicated: Secondary | ICD-10-CM

## 2019-11-03 DIAGNOSIS — Z3481 Encounter for supervision of other normal pregnancy, first trimester: Secondary | ICD-10-CM | POA: Diagnosis not present

## 2019-11-03 DIAGNOSIS — O219 Vomiting of pregnancy, unspecified: Secondary | ICD-10-CM

## 2019-11-03 DIAGNOSIS — R8271 Bacteriuria: Secondary | ICD-10-CM

## 2019-11-03 DIAGNOSIS — Z3A11 11 weeks gestation of pregnancy: Secondary | ICD-10-CM

## 2019-11-03 MED ORDER — PRENATAL ADULT GUMMY/DHA/FA 0.4-25 MG PO CHEW: 1.0000 | CHEWABLE_TABLET | Freq: Every day | ORAL | 11 refills | Status: DC

## 2019-11-03 MED ORDER — DOXYLAMINE-PYRIDOXINE 10-10 MG PO TBEC: DELAYED_RELEASE_TABLET | ORAL | 5 refills | Status: DC

## 2019-11-03 MED ORDER — BLOOD PRESSURE MONITOR KIT: 1.0000 | PACK | Freq: Every day | 0 refills | Status: DC

## 2019-11-03 MED ORDER — VITAFOL GUMMIES 3.33-0.333-34.8 MG PO CHEW: 3.0000 | CHEWABLE_TABLET | Freq: Every day | ORAL | 11 refills | Status: DC

## 2019-11-03 NOTE — Progress Notes (Signed)
Patient presents for Initial OB visit. Patient has no concerns.

## 2019-11-03 NOTE — Addendum Note (Signed)
Addended by: Shelly Bombard on: 11/03/2019 09:32 AM   Modules accepted: Orders

## 2019-11-03 NOTE — Progress Notes (Signed)
Subjective:    Stacy Warner is being seen today for her first obstetrical visit.  This is not a planned pregnancy. She is at [redacted]w[redacted]d gestation. Her obstetrical history is significant for none. Relationship with FOB: significant other, not living together. Patient does intend to breast feed. Pregnancy history fully reviewed.  The information documented in the HPI was reviewed and verified.  Menstrual History: OB History    Gravida  2   Para      Term      Preterm      AB  1   Living        SAB      TAB  1   Ectopic      Multiple      Live Births               Patient's last menstrual period was 08/17/2019.    Past Medical History:  Diagnosis Date  . Anemia   . Asthma   . Chlamydia    age 24  . Infection    UTI    Past Surgical History:  Procedure Laterality Date  . TONSILLECTOMY      (Not in a hospital admission)  No Known Allergies  Social History   Tobacco Use  . Smoking status: Former Smoker    Packs/day: 0.25    Years: 1.00    Pack years: 0.25    Types: Cigars  . Smokeless tobacco: Never Used  . Tobacco comment: black and mild  Substance Use Topics  . Alcohol use: Not Currently    Comment: occasional, not since confirmed pregnancy    Family History  Problem Relation Age of Onset  . Heart disease Paternal Grandmother   . Stroke Paternal Grandmother   . Thyroid disease Paternal Grandfather      Review of Systems Constitutional: negative for weight loss Gastrointestinal: negative for vomiting Genitourinary:negative for genital lesions and vaginal discharge and dysuria Musculoskeletal:negative for back pain Behavioral/Psych: negative for abusive relationship, depression, illegal drug usage and tobacco use    Objective:    BP 115/68   Pulse 79   Wt 132 lb 8 oz (60.1 kg)   LMP 08/17/2019   BMI 22.74 kg/m  General Appearance:    Alert, cooperative, no distress, appears stated age  Head:    Normocephalic, without obvious  abnormality, atraumatic  Eyes:    PERRL, conjunctiva/corneas clear, EOM's intact, fundi    benign, both eyes  Ears:    Normal TM's and external ear canals, both ears  Nose:   Nares normal, septum midline, mucosa normal, no drainage    or sinus tenderness  Throat:   Lips, mucosa, and tongue normal; teeth and gums normal  Neck:   Supple, symmetrical, trachea midline, no adenopathy;    thyroid:  no enlargement/tenderness/nodules; no carotid   bruit or JVD  Back:     Symmetric, no curvature, ROM normal, no CVA tenderness  Lungs:     Clear to auscultation bilaterally, respirations unlabored  Chest Wall:    No tenderness or deformity   Heart:    Regular rate and rhythm, S1 and S2 normal, no murmur, rub   or gallop  Breast Exam:    No tenderness, masses, or nipple abnormality  Abdomen:     Soft, non-tender, bowel sounds active all four quadrants,    no masses, no organomegaly  Genitalia:    Normal female without lesion, discharge or tenderness  Extremities:   Extremities normal, atraumatic, no  cyanosis or edema  Pulses:   2+ and symmetric all extremities  Skin:   Skin color, texture, turgor normal, no rashes or lesions  Lymph nodes:   Cervical, supraclavicular, and axillary nodes normal  Neurologic:   CNII-XII intact, normal strength, sensation and reflexes    throughout      Lab Review Urine pregnancy test Labs reviewed yes Radiologic studies reviewed no Assessment:    Pregnancy at [redacted]w[redacted]d weeks    Plan:      Prenatal vitamins.  Counseling provided regarding continued use of seat belts, cessation of alcohol consumption, smoking or use of illicit drugs; infection precautions i.e., influenza/TDAP immunizations, toxoplasmosis,CMV, parvovirus, listeria and varicella; workplace safety, exercise during pregnancy; routine dental care, safe medications, sexual activity, hot tubs, saunas, pools, travel, caffeine use, fish and methlymercury, potential toxins, hair treatments, varicose  veins Weight gain recommendations per IOM guidelines reviewed: underweight/BMI< 18.5--> gain 28 - 40 lbs; normal weight/BMI 18.5 - 24.9--> gain 25 - 35 lbs; overweight/BMI 25 - 29.9--> gain 15 - 25 lbs; obese/BMI >30->gain  11 - 20 lbs Problem list reviewed and updated. FIRST/CF mutation testing/NIPT/QUAD SCREEN/fragile X/Ashkenazi Jewish population testing/Spinal muscular atrophy discussed: requested. Role of ultrasound in pregnancy discussed; fetal survey: requested. Amniocentesis discussed: not indicated. VBAC calculator score: VBAC consent form provided No orders of the defined types were placed in this encounter.  Orders Placed This Encounter  Procedures  . Culture, OB Urine  . Obstetric Panel, Including HIV  . Genetic Screening    Follow up in 4 weeks. 50% of 25 min visit spent on counseling and coordination of care.    Shelly Bombard, MD 11/03/2019 9:04 AM

## 2019-11-03 NOTE — Addendum Note (Signed)
Addended by: Tarry Kos on: 11/03/2019 12:08 PM   Modules accepted: Orders

## 2019-11-03 NOTE — Addendum Note (Signed)
Addended by: Tarry Kos on: 11/03/2019 12:00 PM   Modules accepted: Orders

## 2019-11-05 LAB — OBSTETRIC PANEL, INCLUDING HIV
Antibody Screen: NEGATIVE
Basophils Absolute: 0.1 10*3/uL (ref 0.0–0.2)
Basos: 1 %
EOS (ABSOLUTE): 0.2 10*3/uL (ref 0.0–0.4)
Eos: 2 %
HIV Screen 4th Generation wRfx: NONREACTIVE
Hematocrit: 39.8 % (ref 34.0–46.6)
Hemoglobin: 13.9 g/dL (ref 11.1–15.9)
Hepatitis B Surface Ag: NEGATIVE
Immature Grans (Abs): 0 10*3/uL (ref 0.0–0.1)
Immature Granulocytes: 0 %
Lymphocytes Absolute: 2.7 10*3/uL (ref 0.7–3.1)
Lymphs: 29 %
MCH: 32.8 pg (ref 26.6–33.0)
MCHC: 34.9 g/dL (ref 31.5–35.7)
MCV: 94 fL (ref 79–97)
Monocytes Absolute: 1 10*3/uL — ABNORMAL HIGH (ref 0.1–0.9)
Monocytes: 11 %
Neutrophils Absolute: 5.4 10*3/uL (ref 1.4–7.0)
Neutrophils: 57 %
Platelets: 313 10*3/uL (ref 150–450)
RBC: 4.24 x10E6/uL (ref 3.77–5.28)
RDW: 13.3 % (ref 11.7–15.4)
RPR Ser Ql: NONREACTIVE
Rh Factor: POSITIVE
Rubella Antibodies, IGG: 1.03 index (ref >0.99)
WBC: 9.3 10*3/uL (ref 3.4–10.8)

## 2019-11-06 LAB — CERVICOVAGINAL ANCILLARY ONLY
Bacterial Vaginitis (gardnerella): POSITIVE — AB
Candida Glabrata: NEGATIVE
Candida Vaginitis: NEGATIVE
Chlamydia: NEGATIVE
Comment: NEGATIVE
Comment: NEGATIVE
Comment: NEGATIVE
Comment: NEGATIVE
Comment: NEGATIVE
Comment: NORMAL
Neisseria Gonorrhea: NEGATIVE
Trichomonas: POSITIVE — AB

## 2019-11-06 LAB — CULTURE, OB URINE

## 2019-11-06 LAB — CYTOLOGY - PAP: Diagnosis: NEGATIVE

## 2019-11-06 LAB — URINE CULTURE, OB REFLEX

## 2019-11-07 ENCOUNTER — Other Ambulatory Visit: Payer: Self-pay | Admitting: Obstetrics

## 2019-11-07 ENCOUNTER — Telehealth: Payer: Self-pay

## 2019-11-07 DIAGNOSIS — A599 Trichomoniasis, unspecified: Secondary | ICD-10-CM

## 2019-11-07 DIAGNOSIS — B9689 Other specified bacterial agents as the cause of diseases classified elsewhere: Secondary | ICD-10-CM

## 2019-11-07 DIAGNOSIS — O2340 Unspecified infection of urinary tract in pregnancy, unspecified trimester: Secondary | ICD-10-CM

## 2019-11-07 MED ORDER — METRONIDAZOLE 500 MG PO TABS: 500.0000 mg | ORAL_TABLET | Freq: Two times a day (BID) | ORAL | 2 refills | Status: DC

## 2019-11-07 MED ORDER — CEFUROXIME AXETIL 500 MG PO TABS: 500.0000 mg | ORAL_TABLET | Freq: Two times a day (BID) | ORAL | 0 refills | Status: DC

## 2019-11-07 NOTE — Telephone Encounter (Signed)
S/w pt and advised of results, treatment plan and medications sent by the provider.

## 2019-11-09 ENCOUNTER — Encounter: Payer: Self-pay | Admitting: Obstetrics

## 2019-11-09 DIAGNOSIS — R8271 Bacteriuria: Secondary | ICD-10-CM | POA: Insufficient documentation

## 2019-11-09 DIAGNOSIS — J45909 Unspecified asthma, uncomplicated: Secondary | ICD-10-CM | POA: Insufficient documentation

## 2019-11-09 DIAGNOSIS — O99891 Other specified diseases and conditions complicating pregnancy: Secondary | ICD-10-CM | POA: Insufficient documentation

## 2019-11-10 ENCOUNTER — Encounter: Payer: Self-pay | Admitting: Obstetrics

## 2019-11-13 ENCOUNTER — Telehealth: Payer: Self-pay

## 2019-11-13 NOTE — Telephone Encounter (Signed)
Pt reports she has been using Diclegis for a couple weeks to help with nausea and it is not working. Pt would like to know if there's something else she can try.

## 2019-11-30 ENCOUNTER — Encounter: Payer: Self-pay | Admitting: Obstetrics

## 2019-11-30 ENCOUNTER — Telehealth (INDEPENDENT_AMBULATORY_CARE_PROVIDER_SITE_OTHER): Payer: Medicaid Other | Admitting: Obstetrics

## 2019-11-30 DIAGNOSIS — R8271 Bacteriuria: Secondary | ICD-10-CM

## 2019-11-30 DIAGNOSIS — O219 Vomiting of pregnancy, unspecified: Secondary | ICD-10-CM

## 2019-11-30 DIAGNOSIS — Z3A15 15 weeks gestation of pregnancy: Secondary | ICD-10-CM

## 2019-11-30 DIAGNOSIS — Z3480 Encounter for supervision of other normal pregnancy, unspecified trimester: Secondary | ICD-10-CM

## 2019-11-30 DIAGNOSIS — O99891 Other specified diseases and conditions complicating pregnancy: Secondary | ICD-10-CM

## 2019-11-30 DIAGNOSIS — O99820 Streptococcus B carrier state complicating pregnancy: Secondary | ICD-10-CM

## 2019-11-30 NOTE — Progress Notes (Signed)
   OBSTETRICS PRENATAL VIRTUAL VISIT ENCOUNTER NOTE  Provider location: [facility] [place]   I connected with [name] on 11/30/19 at 10:15 AM EDT by MyChart Video Encounter at home and verified that I am speaking with the correct person using two identifiers.   I discussed the limitations, risks, security and privacy concerns of performing an evaluation and management service virtually and the availability of in person appointments. I also discussed with the patient that there may be a patient responsible charge related to this service. The patient expressed understanding and agreed to proceed. Subjective:  Stacy Warner is a 24 y.o. G2P0010 at [redacted]w[redacted]d being seen today for ongoing prenatal care.  She is currently monitored for the following issues for this low-risk pregnancy and has Iron deficiency anemia; Thrombocytosis ([place]); Tobacco abuse; Marijuana abuse; Supervision of other normal pregnancy, antepartum; Asymptomatic bacteriuria during pregnancy in first trimester; and Asthma on their problem list.  Patient reports no complaints.  Contractions: Not present. Vag. Bleeding: None.  Movement: Absent. Denies any leaking of fluid.   The following portions of the patient's history were reviewed and updated as appropriate: allergies, current medications, past family history, past medical history, past social history, past surgical history and problem list.   Objective:  There were no vitals filed for this visit.  Fetal Status:     Movement: Absent     General:  Alert, oriented and cooperative. Patient is in no acute distress.  Respiratory: Normal respiratory effort, no problems with respiration noted  Mental Status: Normal mood and affect. Normal behavior. Normal judgment and thought content.  Rest of physical exam deferred due to type of encounter  Imaging: No results found.  Assessment and Plan:  Pregnancy: G2P0010 at [redacted]w[redacted]d 1. Supervision of other normal  pregnancy, antepartum  2. Asymptomatic bacteriuria during pregnancy in first trimester - repeat culture at 28 weeks - treat in labor  3. Nausea and vomiting during pregnancy - much improved.  On no meds.  Preterm labor symptoms and general obstetric precautions including but not limited to vaginal bleeding, contractions, leaking of fluid and fetal movement were reviewed in detail with the patient. I discussed the assessment and treatment plan with the patient. The patient was provided an opportunity to ask questions and all were answered. The patient agreed with the plan and demonstrated an understanding of the instructions. The patient was advised to call back or seek an in-person office evaluation/go to [facility] at [facility] for any urgent or concerning symptoms. Please refer to After Visit Summary for other counseling recommendations.   I provided 10 minutes of face-to-face time during this encounter.  Return in about 4 weeks (around 12/28/2019) for MyChart.   Baltazar Najjar, MD [facility] 11/30/2019

## 2019-11-30 NOTE — Progress Notes (Signed)
Patient presents for Mychart ROB. Pt identified with 2 patient identifiers. Pt unable to take bp at this time. Pt has no concerns today.

## 2019-12-28 ENCOUNTER — Telehealth (INDEPENDENT_AMBULATORY_CARE_PROVIDER_SITE_OTHER): Payer: Medicaid Other | Admitting: Obstetrics

## 2019-12-28 ENCOUNTER — Encounter: Payer: Self-pay | Admitting: Obstetrics

## 2019-12-28 VITALS — BP 135/88 | HR 103

## 2019-12-28 DIAGNOSIS — M549 Dorsalgia, unspecified: Secondary | ICD-10-CM

## 2019-12-28 DIAGNOSIS — O99891 Other specified diseases and conditions complicating pregnancy: Secondary | ICD-10-CM

## 2019-12-28 DIAGNOSIS — J301 Allergic rhinitis due to pollen: Secondary | ICD-10-CM

## 2019-12-28 DIAGNOSIS — R8271 Bacteriuria: Secondary | ICD-10-CM

## 2019-12-28 DIAGNOSIS — R030 Elevated blood-pressure reading, without diagnosis of hypertension: Secondary | ICD-10-CM

## 2019-12-28 DIAGNOSIS — Z3480 Encounter for supervision of other normal pregnancy, unspecified trimester: Secondary | ICD-10-CM

## 2019-12-28 MED ORDER — LORATADINE 10 MG PO TABS: 10.0000 mg | ORAL_TABLET | Freq: Every day | ORAL | 11 refills | Status: DC

## 2019-12-28 MED ORDER — COMFORT FIT MATERNITY SUPP SM MISC: 0 refills | Status: DC

## 2019-12-28 MED ORDER — LABETALOL HCL 200 MG PO TABS: 200.0000 mg | ORAL_TABLET | Freq: Two times a day (BID) | ORAL | 3 refills | Status: DC

## 2019-12-28 NOTE — Progress Notes (Signed)
Patient presents for Mychart ROB. Patient identified with 2 patient identifiers. She is [redacted]w[redacted]d. Her bp today is 152/84. Repeat BP is 135/88. Patient states that she has been having headaches but is relieved with tylenol. She states that she has headaches about once a week. She states that she has had an increase headaches since she entered the second trimester. She denies having any swelling or visual changes. She has no concerns.

## 2019-12-28 NOTE — Progress Notes (Signed)
   OBSTETRICS PRENATAL VIRTUAL VISIT ENCOUNTER NOTE  Provider location: [facility] [place]   I connected with [name] on 12/28/19 at 10:45 AM EDT by MyChart Video Encounter at home and verified that I am speaking with the correct person using two identifiers.   I discussed the limitations, risks, security and privacy concerns of performing an evaluation and management service virtually and the availability of in person appointments. I also discussed with the patient that there may be a patient responsible charge related to this service. The patient expressed understanding and agreed to proceed. Subjective:  Stacy Warner is a 24 y.o. G2P0010 at [redacted]w[redacted]d being seen today for ongoing prenatal care.  She is currently monitored for the following issues for this low-risk pregnancy and has Iron deficiency anemia; Thrombocytosis ([place]); Tobacco abuse; Marijuana abuse; Supervision of other normal pregnancy, antepartum; Asymptomatic bacteriuria during pregnancy in first trimester; and Asthma on their problem list.  Patient reports backache and seasonal allergic rhinitis.  Contractions: Not present. Vag. Bleeding: None.  Movement: Absent. Denies any leaking of fluid.   The following portions of the patient's history were reviewed and updated as appropriate: allergies, current medications, past family history, past medical history, past social history, past surgical history and problem list.   Objective:   Vitals:   12/28/19 1014 12/28/19 1018  BP: (!) 152/84 135/88  Pulse: (!) 103 (!) 103    Fetal Status:     Movement: Absent     General:  Alert, oriented and cooperative. Patient is in no acute distress.  Respiratory: Normal respiratory effort, no problems with respiration noted  Mental Status: Normal mood and affect. Normal behavior. Normal judgment and thought content.  Rest of physical exam deferred due to type of encounter  Imaging: No results found.  Assessment  and Plan:  Pregnancy: G2P0010 at [redacted]w[redacted]d 1. Supervision of other normal pregnancy, antepartum Rx: - [place] MFM OB DETAIL +14 WK; Future  2. Elevated BP without diagnosis of hypertension - follow BP closely  3. Asymptomatic bacteriuria during pregnancy in first trimester - treat in labor  4. Backache symptom Rx: - Elastic Bandages & Supports (COMFORT FIT MATERNITY SUPP SM) MISC; Wear as directed.  Dispense: 1 each; Refill: 0  5. Seasonal allergic rhinitis due to pollen Rx: - loratadine (CLARITIN) 10 MG tablet; Take 1 tablet (10 mg total) by mouth daily.  Dispense: 30 tablet; Refill: 11   Preterm labor symptoms and general obstetric precautions including but not limited to vaginal bleeding, contractions, leaking of fluid and fetal movement were reviewed in detail with the patient. I discussed the assessment and treatment plan with the patient. The patient was provided an opportunity to ask questions and all were answered. The patient agreed with the plan and demonstrated an understanding of the instructions. The patient was advised to call back or seek an in-person office evaluation/go to MAU at [facility] for any urgent or concerning symptoms. Please refer to After Visit Summary for other counseling recommendations.   I provided 10 minutes of face-to-face time during this encounter.  Return in about 4 weeks (around 01/25/2020) for MyChart.   Baltazar Najjar, MD [facility] 12/28/2019

## 2020-01-14 ENCOUNTER — Observation Stay (HOSPITAL_COMMUNITY)
Admission: AD | Admit: 2020-01-14 | Discharge: 2020-01-15 | Disposition: A | Discharge status: Home / Self Care | Payer: Medicaid Other | Attending: Obstetrics and Gynecology | Admitting: Obstetrics and Gynecology

## 2020-01-14 ENCOUNTER — Other Ambulatory Visit: Payer: Self-pay

## 2020-01-14 ENCOUNTER — Encounter (HOSPITAL_COMMUNITY): Payer: Self-pay | Admitting: Obstetrics and Gynecology

## 2020-01-14 DIAGNOSIS — Z87891 Personal history of nicotine dependence: Secondary | ICD-10-CM | POA: Insufficient documentation

## 2020-01-14 DIAGNOSIS — Z79899 Other long term (current) drug therapy: Secondary | ICD-10-CM | POA: Diagnosis not present

## 2020-01-14 DIAGNOSIS — Z20822 Contact with and (suspected) exposure to covid-19: Secondary | ICD-10-CM | POA: Insufficient documentation

## 2020-01-14 DIAGNOSIS — Z3A21 21 weeks gestation of pregnancy: Secondary | ICD-10-CM

## 2020-01-14 DIAGNOSIS — Z3A20 20 weeks gestation of pregnancy: Secondary | ICD-10-CM

## 2020-01-14 DIAGNOSIS — O2300 Infections of kidney in pregnancy, unspecified trimester: Secondary | ICD-10-CM | POA: Diagnosis present

## 2020-01-14 DIAGNOSIS — O2302 Infections of kidney in pregnancy, second trimester: Principal | ICD-10-CM | POA: Insufficient documentation

## 2020-01-14 LAB — COMPREHENSIVE METABOLIC PANEL
ALT: 18 U/L (ref 0–44)
AST: 21 U/L (ref 15–41)
Albumin: 2.9 g/dL — ABNORMAL LOW (ref 3.5–5.0)
Alkaline Phosphatase: 50 U/L (ref 38–126)
Anion gap: 10 (ref 5–15)
BUN: <5 mg/dL — ABNORMAL LOW (ref 6–20)
CO2: 19 mmol/L — ABNORMAL LOW (ref 22–32)
Calcium: 9.2 mg/dL (ref 8.9–10.3)
Chloride: 106 mmol/L (ref 98–111)
Creatinine, Ser: 0.64 mg/dL (ref 0.44–1.00)
GFR calc Af Amer: >60 mL/min (ref >60)
GFR calc non Af Amer: >60 mL/min (ref >60)
Glucose, Bld: 89 mg/dL (ref 70–99)
Potassium: 4 mmol/L (ref 3.5–5.1)
Sodium: 135 mmol/L (ref 135–145)
Total Bilirubin: 0.5 mg/dL (ref 0.3–1.2)
Total Protein: 6.7 g/dL (ref 6.5–8.1)

## 2020-01-14 LAB — CBC WITH DIFFERENTIAL/PLATELET
Abs Immature Granulocytes: 0.05 10*3/uL (ref 0.00–0.07)
Basophils Absolute: 0 10*3/uL (ref 0.0–0.1)
Basophils Relative: 0 %
Eosinophils Absolute: 0.1 10*3/uL (ref 0.0–0.5)
Eosinophils Relative: 1 %
HCT: 36 % (ref 36.0–46.0)
Hemoglobin: 12.2 g/dL (ref 12.0–15.0)
Immature Granulocytes: 0 %
Lymphocytes Relative: 19 %
Lymphs Abs: 2.6 10*3/uL (ref 0.7–4.0)
MCH: 30.7 pg (ref 26.0–34.0)
MCHC: 33.9 g/dL (ref 30.0–36.0)
MCV: 90.7 fL (ref 80.0–100.0)
Monocytes Absolute: 1.4 10*3/uL — ABNORMAL HIGH (ref 0.1–1.0)
Monocytes Relative: 10 %
Neutro Abs: 9.6 10*3/uL — ABNORMAL HIGH (ref 1.7–7.7)
Neutrophils Relative %: 70 %
Platelets: 345 10*3/uL (ref 150–400)
RBC: 3.97 MIL/uL (ref 3.87–5.11)
RDW: 13.9 % (ref 11.5–15.5)
WBC: 13.9 10*3/uL — ABNORMAL HIGH (ref 4.0–10.5)
nRBC: 0 % (ref 0.0–0.2)

## 2020-01-14 LAB — URINALYSIS, ROUTINE W REFLEX MICROSCOPIC
Bilirubin Urine: NEGATIVE
Glucose, UA: NEGATIVE mg/dL
Ketones, ur: NEGATIVE mg/dL
Nitrite: POSITIVE — AB
Protein, ur: NEGATIVE mg/dL
Specific Gravity, Urine: 1.009 (ref 1.005–1.030)
WBC, UA: >50 WBC/hpf — ABNORMAL HIGH (ref 0–5)
pH: 7 (ref 5.0–8.0)

## 2020-01-14 LAB — TYPE AND SCREEN
ABO/RH(D): B POS
Antibody Screen: NEGATIVE

## 2020-01-14 LAB — SARS CORONAVIRUS 2 BY RT PCR (HOSPITAL ORDER, PERFORMED IN ~~LOC~~ HOSPITAL LAB): SARS Coronavirus 2: NEGATIVE

## 2020-01-14 LAB — ABO/RH: ABO/RH(D): B POS

## 2020-01-14 MED ORDER — CALCIUM CARBONATE ANTACID 500 MG PO CHEW: 2.0000 | CHEWABLE_TABLET | ORAL | Status: DC | PRN

## 2020-01-14 MED ORDER — ZOLPIDEM TARTRATE 5 MG PO TABS: 5.0000 mg | ORAL_TABLET | Freq: Every evening | ORAL | Status: DC | PRN

## 2020-01-14 MED ORDER — SODIUM CHLORIDE 0.9 % IV SOLN
2.0000 g | Freq: Every day | INTRAVENOUS | Status: DC
  Administered 2020-01-15: 2 g via INTRAVENOUS
  Filled 2020-01-14: qty 2

## 2020-01-14 MED ORDER — SODIUM CHLORIDE 0.9 % IV SOLN
2.0000 g | Freq: Once | INTRAVENOUS | Status: AC
  Administered 2020-01-14: 2 g via INTRAVENOUS
  Filled 2020-01-14: qty 2

## 2020-01-14 MED ORDER — PROMETHAZINE HCL 25 MG/ML IJ SOLN
12.5000 mg | INTRAMUSCULAR | Status: DC | PRN
  Administered 2020-01-14: 12.5 mg via INTRAVENOUS
  Filled 2020-01-14: qty 1

## 2020-01-14 MED ORDER — SODIUM CHLORIDE 0.9 % IV BOLUS
1000.0000 mL | Freq: Once | INTRAVENOUS | Status: AC
  Administered 2020-01-14: 1000 mL via INTRAVENOUS

## 2020-01-14 MED ORDER — ACETAMINOPHEN 325 MG PO TABS: 650.0000 mg | ORAL_TABLET | ORAL | Status: DC | PRN

## 2020-01-14 MED ORDER — PRENATAL MULTIVITAMIN CH: 1.0000 | ORAL_TABLET | Freq: Every day | ORAL | Status: DC

## 2020-01-14 MED ORDER — SODIUM CHLORIDE 0.9% FLUSH: 3.0000 mL | INTRAVENOUS | Status: DC | PRN

## 2020-01-14 MED ORDER — SODIUM CHLORIDE 0.9% FLUSH
3.0000 mL | Freq: Two times a day (BID) | INTRAVENOUS | Status: DC
  Administered 2020-01-14 (×2): 3 mL via INTRAVENOUS

## 2020-01-14 MED ORDER — DOCUSATE SODIUM 100 MG PO CAPS: 100.0000 mg | ORAL_CAPSULE | Freq: Every day | ORAL | Status: DC

## 2020-01-14 MED ORDER — SODIUM CHLORIDE 0.9 % IV SOLN: 250.0000 mL | INTRAVENOUS | Status: DC | PRN

## 2020-01-14 NOTE — MAU Note (Signed)
Stacy Warner is a 24 y.o. at 110w3d here in MAU reporting:  + dull right sided abdominal pain intermittent Onset of complaint: Thursday. Was worse last night. Pain score: 3/10 Tried hot baths and tylenol last night. Endorses relief with these measures. Then came back this morning.   Vitals:   01/14/20 1239  BP: 121/81  Pulse: 87  Resp: 16  Temp: 98 F (36.7 C)  SpO2: 100%    FHT:  159 Via doppler Lab orders placed from triage: ua

## 2020-01-14 NOTE — H&P (Addendum)
FACULTY PRACTICE ANTEPARTUM ADMISSION HISTORY AND PHYSICAL NOTE   History of Present Illness: Stacy Warner is a 24 y.o. G2P0010 at 38w3dadmitted for pyelonephritis. Presented to MAU reporting flank pain.  Symptoms started on Thursday but worsened last night.  Reports history of pyelonephritis and states current symptoms feel similar.  Denies dysuria or hematuria but does report some urinary frequency and urgency.  Denies fever or chills.  Denies contractions, loss of fluid, or vaginal bleeding.  Was treated for a urinary tract infection in March and completed her antibiotics. Urinalysis in MAU positive for nitrites & patient with positive CVA tenderness on exam.   Patient reports the fetal movement as active. Patient reports uterine contraction  activity as none. Patient reports  vaginal bleeding as none. Patient describes fluid per vagina as None. Fetal presentation is unsure.  Patient Active Problem List   Diagnosis Date Noted  . Asymptomatic bacteriuria during pregnancy in first trimester 11/09/2019  . Asthma   . Supervision of other normal pregnancy, antepartum 10/30/2019  . Tobacco abuse 04/23/2016  . Marijuana abuse 04/23/2016  . Iron deficiency anemia 04/18/2016    Class: Acute  . Thrombocytosis ([place] 04/18/2016    Class: Acute    Past Medical History:  Diagnosis Date  . Anemia   . Asthma   . Chlamydia    age 24 . Infection    UTI    Past Surgical History:  Procedure Laterality Date  . TONSILLECTOMY      OB History  Gravida Para Term Preterm AB Living  2       1    SAB TAB Ectopic Multiple Live Births    1          # Outcome Date GA Lbr Len/2nd Weight Sex Delivery Anes PTL Lv  2 Current           1 TAB 01/29/19            Social History   Socioeconomic History  . Marital status: Single    Spouse name: Not on file  . Number of children: Not on file  . Years of education: Not on file  . Highest education level: Not on file  Occupational History   . Not on file  Tobacco Use  . Smoking status: Former Smoker    Packs/day: 0.25    Years: 1.00    Pack years: 0.25    Types: Cigars  . Smokeless tobacco: Never Used  . Tobacco comment: black and mild  Substance and Sexual Activity  . Alcohol use: Not Currently    Comment: occasional, not since confirmed pregnancy  . Drug use: Not Currently    Types: Marijuana    Comment: last used 2 days ago  . Sexual activity: Yes    Partners: Male    Birth control/protection: None  Other Topics Concern  . Not on file  Social History Narrative  . Not on file   Social Determinants of Health   Financial Resource Strain:   . Difficulty of Paying Living Expenses:   Food Insecurity:   . Worried About RCharity fundraiserin the Last Year:   . RArboriculturistin the Last Year:   Transportation Needs:   . LFilm/video editor(Medical):   .Marland KitchenLack of Transportation (Non-Medical):   Physical Activity:   . Days of Exercise per Week:   . Minutes of Exercise per Session:   Stress:   . Feeling of Stress :  Social Connections:   . Frequency of Communication with Friends and Family:   . Frequency of Social Gatherings with Friends and Family:   . Attends Religious Services:   . Active Member of Clubs or Organizations:   . Attends Archivist Meetings:   Marland Kitchen Marital Status:     Family History  Problem Relation Age of Onset  . Heart disease Paternal Grandmother   . Stroke Paternal Grandmother   . Thyroid disease Paternal Grandfather     No Known Allergies  Medications Prior to Admission  Medication Sig Dispense Refill Last Dose  . acetaminophen (TYLENOL) 500 MG tablet Take 500 mg by mouth every 6 (six) hours as needed for mild pain, moderate pain or headache.     . Blood Pressure Monitor KIT 1 each by Does not apply route daily. 1 kit 0   . Doxylamine-Pyridoxine 10-10 MG TBEC 1 tablet AM, 1 tablet mid afternoon, 2 tablets @ bedtime.  Max dose 4 tablets daily 100 tablet 5   .  Elastic Bandages & Supports (COMFORT FIT MATERNITY SUPP SM) MISC Wear as directed. 1 each 0   . loratadine (CLARITIN) 10 MG tablet Take 1 tablet (10 mg total) by mouth daily. 30 tablet 11   . Prenatal Vit-Fe Fumarate-FA (PRENATAL MULTIVITAMIN) TABS tablet Take 1 tablet by mouth daily at 12 noon.     . Prenatal Vit-Fe Phos-FA-Omega (VITAFOL GUMMIES) 3.33-0.333-34.8 MG CHEW Chew 3 each by mouth daily. 90 tablet 11   . promethazine (PHENERGAN) 25 MG tablet Take 1 tablet (25 mg total) by mouth every 6 (six) hours as needed for nausea. 30 tablet 0     Review of Systems - History obtained from the patient General ROS: negative for - chills or fever Gastrointestinal ROS: positive for - nausea/vomiting negative for - abdominal pain, constipation or diarrhea Genito-Urinary ROS: positive for - flank pain & urinary frequency negative for - dysuria, hematuria or vaginal bleeding  Vitals:  BP 121/81 (BP Location: Right Arm)   Pulse 87   Temp 98 F (36.7 C) (Oral)   Resp 16   LMP 08/17/2019   SpO2 100% Comment: room air Physical Examination: CONSTITUTIONAL: Well-developed, well-nourished female in no acute distress.  HENT:  Normocephalic, atraumatic, External right and left ear normal. Oropharynx is clear and moist EYES: Conjunctivae and EOM are normal. Pupils are equal, round, and reactive to light. No scleral icterus.  NECK: Normal range of motion, supple, no masses SKIN: Skin is warm and dry. No rash noted. Not diaphoretic. No erythema. No pallor. [place]: Alert and oriented to person, place, and time. Normal reflexes, muscle tone coordination. No cranial nerve deficit noted. PSYCHIATRIC: Normal mood and affect. Normal behavior. Normal judgment and thought content. CARDIOVASCULAR: Normal heart rate noted, regular rhythm RESPIRATORY: Effort and breath sounds normal, no problems with respiration noted ABDOMEN: + right CVA tenderness. Soft, nontender, nondistended, gravid. MUSCULOSKELETAL: Normal  range of motion. No edema and no tenderness. 2+ distal pulses.   Labs:  Results for orders placed or performed during the hospital encounter of 01/14/20 (from the past 24 hour(s))  Urinalysis, Routine w reflex microscopic   Collection Time: 01/14/20 12:34 PM  Result Value Ref Range   Color, Urine YELLOW YELLOW   APPearance CLOUDY (A) CLEAR   Specific Gravity, Urine 1.009 1.005 - 1.030   pH 7.0 5.0 - 8.0   Glucose, UA NEGATIVE NEGATIVE mg/dL   Hgb urine dipstick SMALL (A) NEGATIVE   Bilirubin Urine NEGATIVE NEGATIVE   Ketones,  ur NEGATIVE NEGATIVE mg/dL   Protein, ur NEGATIVE NEGATIVE mg/dL   Nitrite POSITIVE (A) NEGATIVE   Leukocytes,Ua LARGE (A) NEGATIVE   RBC / HPF 6-10 0 - 5 RBC/hpf   WBC, UA >50 (H) 0 - 5 WBC/hpf   Bacteria, UA RARE (A) NONE SEEN   Squamous Epithelial / LPF 6-10 0 - 5   WBC Clumps PRESENT   CBC with Differential/Platelet   Collection Time: 01/14/20  1:38 PM  Result Value Ref Range   WBC 13.9 (H) 4.0 - 10.5 K/uL   RBC 3.97 3.87 - 5.11 MIL/uL   Hemoglobin 12.2 12.0 - 15.0 g/dL   HCT 36.0 36.0 - 46.0 %   MCV 90.7 80.0 - 100.0 fL   MCH 30.7 26.0 - 34.0 pg   MCHC 33.9 30.0 - 36.0 g/dL   RDW 13.9 11.5 - 15.5 %   Platelets 345 150 - 400 K/uL   nRBC 0.0 0.0 - 0.2 %   Neutrophils Relative % 70 %   Neutro Abs 9.6 (H) 1.7 - 7.7 K/uL   Lymphocytes Relative 19 %   Lymphs Abs 2.6 0.7 - 4.0 K/uL   Monocytes Relative 10 %   Monocytes Absolute 1.4 (H) 0.1 - 1.0 K/uL   Eosinophils Relative 1 %   Eosinophils Absolute 0.1 0.0 - 0.5 K/uL   Basophils Relative 0 %   Basophils Absolute 0.0 0.0 - 0.1 K/uL   Immature Granulocytes 0 %   Abs Immature Granulocytes 0.05 0.00 - 0.07 K/uL  Type and screen [place]   Collection Time: 01/14/20  1:39 PM  Result Value Ref Range   ABO/RH(D) PENDING    Antibody Screen PENDING    Sample Expiration      01/17/2020,2359 Performed at [facility], [address] [place], [place] [address]     Imaging  Studies: No results found.   Assessment and Plan: 1. Pyelonephritis affecting pregnancy in second trimester   2. [redacted] weeks gestation of pregnancy    -admit to [facility] unit -first dose of IV ceftriaxone 2 gm given in MAU  Jorje Guild, NP 01/14/2020 1:58 PM   Attestation of Attending Supervision of Advanced Practitioner (PA/CNM/NP): Evaluation, management, and procedures were performed by the Advanced Practitioner under my supervision and collaboration.  I have reviewed the Advanced Practitioner's note and chart, and I agree with the management and plan.  I have seen and evaluated this patient, she reports pain in right side, abdomen and back for several days, severe pain last night. Had UTI 2 months ago and completed course. No other complaints, no contractions, leaking or bleeding.   Recommend admission for 48 hrs IV antibiotics, reviewed pyelonephritis and risks with untreated disease. Reviewed plan for discharge home tomorrow after 2nd dose of rocephin and that she will need to be on suppressive antibiotics for the duration of the pregnancy. She verbalizes understanding and is in agreement with plan.  Feliz Beam, M.D. Attending [facility] for [facility] (Faculty Practice)  01/14/2020 5:24 PM

## 2020-01-15 DIAGNOSIS — O2302 Infections of kidney in pregnancy, second trimester: Secondary | ICD-10-CM | POA: Diagnosis not present

## 2020-01-15 DIAGNOSIS — Z3A21 21 weeks gestation of pregnancy: Secondary | ICD-10-CM | POA: Diagnosis not present

## 2020-01-15 MED ORDER — CEPHALEXIN 500 MG PO CAPS: 500.0000 mg | ORAL_CAPSULE | Freq: Every day | ORAL | 5 refills | Status: DC

## 2020-01-15 MED ORDER — CEPHALEXIN 500 MG PO CAPS
500.0000 mg | ORAL_CAPSULE | Freq: Four times a day (QID) | ORAL | 0 refills | Status: AC
Start: 2020-01-15 — End: 2020-01-29

## 2020-01-15 NOTE — Discharge Instructions (Signed)
Start your home antibiotics on 01/16/20.     Pyelonephritis During Pregnancy  What are the causes? This condition is caused by a bacterial infection in the lower urinary tract that spreads to the kidney. What increases the risk? You are more likely to develop this condition if:  You have diabetes.  You have a history of frequent urinary tract infections. What are the signs or symptoms? Symptoms of this condition may begin with symptoms of a lower urinary tract infection. These may include:  A frequent urge to pass urine.  Burning pain when passing urine.  Pain and pressure in your lower abdomen.  Blood in your urine.  Cloudy or smelly urine. As the infection spreads to your kidney, you may have these symptoms:  Fever.  Chills.  Pain and tenderness in your upper abdomen or in your back and sides (flank pain). Flank pain often affects one side of the body, usually the right side.  Nausea, vomiting, or loss of appetite. How is this diagnosed? This condition may be diagnosed based on:  Your symptoms and medical history.  A physical exam.  Tests to confirm the diagnosis. These may include: ? Blood tests to check kidney function and look for signs of infection. ? Urine tests to check for signs of infection, including bacteria, white or red blood cells, and protein. ? Tests to grow and identify the type of bacteria that is causing the infection (urine culture). ? Imaging studies of your kidneys to learn more about your condition. How is this treated? This condition is treated in the hospital with antibiotics that are given into one of your veins through an IV. Your health care provider:  Will start you on an antibiotic that is effective against common urinary tract infections.  May switch to another antibiotic if the results of your urine culture show that your infection is caused by different bacteria.  Will be careful to choose antibiotics that are the safest during  pregnancy. Other treatments may include:  IV fluids if you are nauseous and not able to drink fluids.  Pain and fever medicines.  Medicines for nausea and vomiting. You will be able to go home when your infection is under control. To prevent another infection, you may need to continue taking antibiotics by mouth until your baby is born. Follow these instructions at home: Medicines   Take over-the-counter and prescription medicines only as told by your health care provider.  Take your antibiotic medicine as told by your health care provider. Do not stop taking the antibiotic even if you start to feel better.  Continue to take your prenatal vitamins. Lifestyle   Follow instructions from your health care provider about eating or drinking restrictions. You may need to avoid: ? Foods and drinks with added sugar. ? Caffeine and fruit juice.  Drink enough fluid to keep your urine pale yellow.  Go to the bathroom frequently. Do not hold your urine. Try to empty your bladder completely.  Change your underwear every day. Wear all-cotton underwear. Do not wear tight underwear or pants. General instructions   Take these steps to lower the risk of bacteria getting into your urinary tract: ? Use liquid soap instead of bar soap when showering or bathing. Bacteria can grow on bar soap. ? When you wash yourself, clean the urethra opening first. Use a washcloth to clean the area between your vagina and anus. Pat the area dry with a clean towel. ? Wash your hands before and after you go  to the bathroom. ? Wipe yourself from front to back after going to the bathroom. ? Do not use douches, perfumed soap, creams, or powders. ? Do not soak in a bath for more than 30 minutes.  Return to your normal activities as told by your health care provider. Ask your health care provider what activities are safe for you.  Keep all follow-up visits as told by your health care provider. This is  important. Contact a health care provider if:  You have chills or a fever.  You have any symptoms of infection that do not get better at home.  Symptoms of infection come back.  You have a reaction or side effects from your antibiotic. Get help right away if:  You start having contractions. Summary  Pyelonephritis is an infection of the kidney or kidneys.  This condition results when a bacterial infection in the lower urinary tract spreads to the kidney.  Lower urinary tract infections are common during pregnancy.  Pyelonephritis causes chills, a fever, flank pain, and nausea.  Pyelonephritis is a serious infection that is usually treated in the hospital with IV antibiotics. This information is not intended to replace advice given to you by your health care provider. Make sure you discuss any questions you have with your health care provider. Document Revised: 12/09/2018 Document Reviewed: 11/18/2017 Elsevier Patient Education  [place].

## 2020-01-15 NOTE — Discharge Summary (Signed)
Antenatal Physician Discharge Summary  Patient ID: Stacy Warner MRN: [id number] DOB/AGE: 1995/12/28 24 y.o.  Admit date: 01/14/2020 Discharge date: 01/15/2020  Admission Diagnoses: pyelonephritis  Discharge Diagnoses:  Principal Problem:   Pyelonephritis affecting pregnancy   Prenatal Procedures: doppler  Consults: none  Hospital Course:  This is a 24 y.o. G2P0010 with IUP at 10w4dadmitted for pyelonephritis. Urine culture pending. She was started on rocephin and is feeling much better today, states flank pain is gone and only having some discomfort in right lower abdomen. Denies leaking, bleeding, contractions. Will discharge to home after 48 hrs IV antibiotics with 14 days PO antibiotics. Reviewed she will need to be on suppressive antibiotics for the remainder of the pregnancy. Pt verbalizes understanding of instructions, to follow up with regular appointment.   Discharge Exam: Temp:  [98 F (36.7 C)-98.6 F (37 C)] 98.6 F (37 C) (05/16 1932) Pulse Rate:  [80-87] 84 (05/16 1932) Resp:  [16-18] 18 (05/16 1932) BP: (110-121)/(74-81) 110/74 (05/16 1932) SpO2:  [98 %-100 %] 98 % (05/16 1932) Weight:  [66.7 kg] 66.7 kg (05/16 1500) Physical Examination: CONSTITUTIONAL: Well-developed, well-nourished female in no acute distress.  HENT:  Normocephalic, atraumatic, External right and left ear normal. Oropharynx is clear and moist EYES: Conjunctivae and EOM are normal. Pupils are equal, round, and reactive to light. No scleral icterus.  NECK: Normal range of motion, supple, no masses SKIN: Skin is warm and dry. No rash noted. Not diaphoretic. No erythema. No pallor. [place] Alert and oriented to person, place, and time. Normal reflexes, muscle tone coordination. No cranial nerve deficit noted. PSYCHIATRIC: Normal mood and affect. Normal behavior. Normal judgment and thought content. CARDIOVASCULAR: Normal heart rate noted RESPIRATORY: Effort normal, no problems with respiration  noted MUSCULOSKELETAL: Normal range of motion. No edema and no tenderness. 2+ distal pulses. ABDOMEN: Soft, nontender, nondistended, gravid. CERVIX:   deferred  Fetal monitoring: FHR: 160  Significant Diagnostic Studies:  Results for orders placed or performed during the hospital encounter of 01/14/20 (from the past 168 hour(s))  Urinalysis, Routine w reflex microscopic   Collection Time: 01/14/20 12:34 PM  Result Value Ref Range   Color, Urine YELLOW YELLOW   APPearance CLOUDY (A) CLEAR   Specific Gravity, Urine 1.009 1.005 - 1.030   pH 7.0 5.0 - 8.0   Glucose, UA NEGATIVE NEGATIVE mg/dL   Hgb urine dipstick SMALL (A) NEGATIVE   Bilirubin Urine NEGATIVE NEGATIVE   Ketones, ur NEGATIVE NEGATIVE mg/dL   Protein, ur NEGATIVE NEGATIVE mg/dL   Nitrite POSITIVE (A) NEGATIVE   Leukocytes,Ua LARGE (A) NEGATIVE   RBC / HPF 6-10 0 - 5 RBC/hpf   WBC, UA >50 (H) 0 - 5 WBC/hpf   Bacteria, UA RARE (A) NONE SEEN   Squamous Epithelial / LPF 6-10 0 - 5   WBC Clumps PRESENT   CBC with Differential/Platelet   Collection Time: 01/14/20  1:38 PM  Result Value Ref Range   WBC 13.9 (H) 4.0 - 10.5 K/uL   RBC 3.97 3.87 - 5.11 MIL/uL   Hemoglobin 12.2 12.0 - 15.0 g/dL   HCT 36.0 36.0 - 46.0 %   MCV 90.7 80.0 - 100.0 fL   MCH 30.7 26.0 - 34.0 pg   MCHC 33.9 30.0 - 36.0 g/dL   RDW 13.9 11.5 - 15.5 %   Platelets 345 150 - 400 K/uL   nRBC 0.0 0.0 - 0.2 %   Neutrophils Relative % 70 %   Neutro Abs 9.6 (H) 1.7 -  7.7 K/uL   Lymphocytes Relative 19 %   Lymphs Abs 2.6 0.7 - 4.0 K/uL   Monocytes Relative 10 %   Monocytes Absolute 1.4 (H) 0.1 - 1.0 K/uL   Eosinophils Relative 1 %   Eosinophils Absolute 0.1 0.0 - 0.5 K/uL   Basophils Relative 0 %   Basophils Absolute 0.0 0.0 - 0.1 K/uL   Immature Granulocytes 0 %   Abs Immature Granulocytes 0.05 0.00 - 0.07 K/uL  Comprehensive metabolic panel   Collection Time: 01/14/20  1:38 PM  Result Value Ref Range   Sodium 135 135 - 145 mmol/L   Potassium  4.0 3.5 - 5.1 mmol/L   Chloride 106 98 - 111 mmol/L   CO2 19 (L) 22 - 32 mmol/L   Glucose, Bld 89 70 - 99 mg/dL   BUN <5 (L) 6 - 20 mg/dL   Creatinine, Ser 0.64 0.44 - 1.00 mg/dL   Calcium 9.2 8.9 - 10.3 mg/dL   Total Protein 6.7 6.5 - 8.1 g/dL   Albumin 2.9 (L) 3.5 - 5.0 g/dL   AST 21 15 - 41 U/L   ALT 18 0 - 44 U/L   Alkaline Phosphatase 50 38 - 126 U/L   Total Bilirubin 0.5 0.3 - 1.2 mg/dL   GFR calc non Af Amer >60 >60 mL/min   GFR calc Af Amer >60 >60 mL/min   Anion gap 10 5 - 15  Type and screen [place]   Collection Time: 01/14/20  1:39 PM  Result Value Ref Range   ABO/RH(D) B POS    Antibody Screen NEG    Sample Expiration      01/17/2020,2359 Performed at [facility], [address], [place], [place] [address]   ABO/Rh   Collection Time: 01/14/20  1:39 PM  Result Value Ref Range   ABO/RH(D)      B POS Performed at [facility] [address] [place], [place] [address]   SARS Coronavirus 2 by RT PCR (hospital order, performed in [facility] hospital lab) Nasopharyngeal Nasopharyngeal Swab   Collection Time: 01/14/20  2:05 PM   Specimen: Nasopharyngeal Swab  Result Value Ref Range   SARS Coronavirus 2 NEGATIVE NEGATIVE    Discharge Condition: Stable  Disposition: Discharge disposition: 01-Home or Self Care        Discharge Instructions    Discharge activity:  No Restrictions   Complete by: As directed    Discharge diet:  No restrictions   Complete by: As directed    No sexual activity restrictions   Complete by: As directed    Notify physician for a general feeling that "something is not right"   Complete by: As directed    Notify physician for increase or change in vaginal discharge   Complete by: As directed    Notify physician for intestinal cramps, with or without diarrhea, sometimes described as "gas pain"   Complete by: As directed    Notify physician for leaking of fluid   Complete by: As directed     Notify physician for low, dull backache, unrelieved by heat or Tylenol   Complete by: As directed    Notify physician for menstrual like cramps   Complete by: As directed    Notify physician for pelvic pressure   Complete by: As directed    Notify physician for uterine contractions.  These may be painless and feel like the uterus is tightening or the baby is  "balling up"  Complete by: As directed    Notify physician for vaginal bleeding   Complete by: As directed    PRETERM LABOR:  Includes any of the follwing symptoms that occur between 20 - [redacted] weeks gestation.  If these symptoms are not stopped, preterm labor can result in preterm delivery, placing your baby at risk   Complete by: As directed      Allergies as of 01/15/2020   No Known Allergies     Medication List    TAKE these medications   acetaminophen 500 MG tablet Commonly known as: TYLENOL Take 500 mg by mouth every 6 (six) hours as needed for mild pain, moderate pain or headache.   Blood Pressure Monitor Kit 1 each by Does not apply route daily.   cephALEXin 500 MG capsule Commonly known as: KEFLEX Take 1 capsule (500 mg total) by mouth 4 (four) times daily for 14 days. Start upon leaving the hospital.   cephALEXin 500 MG capsule Commonly known as: Keflex Take 1 capsule (500 mg total) by mouth at bedtime. Start once you have finished the 4x daily medicine.   Brady Supp Sm Misc Wear as directed.   Doxylamine-Pyridoxine 10-10 MG Tbec 1 tablet AM, 1 tablet mid afternoon, 2 tablets @ bedtime.  Max dose 4 tablets daily   loratadine 10 MG tablet Commonly known as: Claritin Take 1 tablet (10 mg total) by mouth daily.   prenatal multivitamin Tabs tablet Take 1 tablet by mouth daily at 12 noon.   promethazine 25 MG tablet Commonly known as: PHENERGAN Take 1 tablet (25 mg total) by mouth every 6 (six) hours as needed for nausea.   Vitafol Gummies 3.33-0.333-34.8 MG Chew Chew 3 each by mouth  daily.      Follow-up Information    [facility]. Go to.   Specialty: Obstetrics and Gynecology Why: scheduled appointment Contact information: [address], [place] [phone]          Signed: Feliz Beam, M.D. Attending [facility] (Faculty Practice)  01/15/2020, 7:26 AM

## 2020-01-15 NOTE — Progress Notes (Signed)
Pt discharged home.  Discharge instructions reviewed with pt and questions answered.

## 2020-01-16 ENCOUNTER — Ambulatory Visit: Payer: Medicaid Other | Attending: Obstetrics

## 2020-01-16 ENCOUNTER — Ambulatory Visit: Payer: Medicaid Other | Admitting: *Deleted

## 2020-01-16 ENCOUNTER — Encounter: Payer: Self-pay | Admitting: *Deleted

## 2020-01-16 ENCOUNTER — Other Ambulatory Visit: Payer: Self-pay

## 2020-01-16 DIAGNOSIS — O359XX0 Maternal care for (suspected) fetal abnormality and damage, unspecified, not applicable or unspecified: Secondary | ICD-10-CM | POA: Diagnosis not present

## 2020-01-16 DIAGNOSIS — O99891 Other specified diseases and conditions complicating pregnancy: Secondary | ICD-10-CM | POA: Insufficient documentation

## 2020-01-16 DIAGNOSIS — R8271 Bacteriuria: Secondary | ICD-10-CM | POA: Insufficient documentation

## 2020-01-16 DIAGNOSIS — Z3480 Encounter for supervision of other normal pregnancy, unspecified trimester: Secondary | ICD-10-CM | POA: Insufficient documentation

## 2020-01-16 DIAGNOSIS — O132 Gestational [pregnancy-induced] hypertension without significant proteinuria, second trimester: Secondary | ICD-10-CM

## 2020-01-16 DIAGNOSIS — Z363 Encounter for antenatal screening for malformations: Secondary | ICD-10-CM | POA: Diagnosis not present

## 2020-01-16 DIAGNOSIS — J45909 Unspecified asthma, uncomplicated: Secondary | ICD-10-CM

## 2020-01-16 DIAGNOSIS — Z3A21 21 weeks gestation of pregnancy: Secondary | ICD-10-CM

## 2020-01-16 LAB — CULTURE, OB URINE: Culture: >=100000 — AB

## 2020-01-17 ENCOUNTER — Other Ambulatory Visit: Payer: Self-pay | Admitting: *Deleted

## 2020-01-17 DIAGNOSIS — O365920 Maternal care for other known or suspected poor fetal growth, second trimester, not applicable or unspecified: Secondary | ICD-10-CM

## 2020-01-25 ENCOUNTER — Encounter: Payer: Medicaid Other | Admitting: Certified Nurse Midwife

## 2020-01-25 ENCOUNTER — Encounter: Payer: Self-pay | Admitting: Certified Nurse Midwife

## 2020-01-25 ENCOUNTER — Other Ambulatory Visit (HOSPITAL_COMMUNITY)
Admission: RE | Admit: 2020-01-25 | Discharge: 2020-01-25 | Disposition: A | Discharge status: Home / Self Care | Payer: Medicaid Other | Source: Ambulatory Visit | Attending: Certified Nurse Midwife | Admitting: Certified Nurse Midwife

## 2020-01-25 ENCOUNTER — Ambulatory Visit (INDEPENDENT_AMBULATORY_CARE_PROVIDER_SITE_OTHER): Payer: Medicaid Other | Admitting: Certified Nurse Midwife

## 2020-01-25 ENCOUNTER — Other Ambulatory Visit: Payer: Self-pay

## 2020-01-25 VITALS — BP 116/74 | HR 80 | Wt 143.0 lb

## 2020-01-25 DIAGNOSIS — O99322 Drug use complicating pregnancy, second trimester: Secondary | ICD-10-CM

## 2020-01-25 DIAGNOSIS — Z3A23 23 weeks gestation of pregnancy: Secondary | ICD-10-CM

## 2020-01-25 DIAGNOSIS — N898 Other specified noninflammatory disorders of vagina: Secondary | ICD-10-CM | POA: Diagnosis present

## 2020-01-25 DIAGNOSIS — R030 Elevated blood-pressure reading, without diagnosis of hypertension: Secondary | ICD-10-CM | POA: Insufficient documentation

## 2020-01-25 DIAGNOSIS — O2302 Infections of kidney in pregnancy, second trimester: Secondary | ICD-10-CM

## 2020-01-25 DIAGNOSIS — O99332 Smoking (tobacco) complicating pregnancy, second trimester: Secondary | ICD-10-CM

## 2020-01-25 DIAGNOSIS — Z3480 Encounter for supervision of other normal pregnancy, unspecified trimester: Secondary | ICD-10-CM

## 2020-01-25 DIAGNOSIS — O99012 Anemia complicating pregnancy, second trimester: Secondary | ICD-10-CM

## 2020-01-25 MED ORDER — TERCONAZOLE 0.4 % VA CREA: 1.0000 | TOPICAL_CREAM | Freq: Every day | VAGINAL | 0 refills | Status: DC

## 2020-01-25 NOTE — Patient Instructions (Addendum)
Considering Waterbirth? Guide for patients at [facility] Why consider waterbirth? . Gentle birth for babies  . Less pain medicine used in labor  . May allow for passive descent/less pushing  . May reduce perineal tears  . More mobility and instinctive maternal position changes  . Increased maternal relaxation  . Reduced blood pressure in labor   Is waterbirth safe? What are the risks of infection, drowning or other complications? . Infection:  [place] Very low risk (3.7 % for tub vs 4.8% for bed)  . 7 in 1 waterbirths with documented infection  . Poorly cleaned equipment most common cause  . Slightly lower group B strep transmission rate  . Drowning  . Maternal:  . Very low risk  . Related to seizures or fainting  . Newborn:  Marland Kitchen Very low risk. No evidence of increased risk of respiratory problems in multiple large studies  . Physiological protection from breathing under water  . Avoid underwater birth if there are any fetal complications  . Once baby's head is out of the water, keep it out.  . Birth complication  . Some reports of cord trauma, but risk decreased by bringing baby to surface gradually  . No evidence of increased risk of shoulder dystocia. Mothers can usually change positions faster in water than in a bed, possibly aiding the maneuvers to free the shoulder.  ? You must attend a Waterbirth class at [facility] at [facility] . 3rd Wednesday of every month from 7-9pm  . Free  . Register by calling [phone] or online at [email or web address]  . Bring [place] the certificate from the class to your prenatal appointment  Meet with a midwife at 36 weeks to see if you can still plan a waterbirth and to sign the consent.   If you plan a waterbirth at [facility] and [facility], you can opt to purchase the following: . Fish Net . Bathing suit top (optional)  . Long-handled mirror (optional)  .  Things that would  prevent you from having a waterbirth: . Unknown or Positive COVID-19 diagnosis upon admission to hospital  . Premature, <37wks  . Previous cesarean birth  . Presence of thick meconium-stained fluid  . Multiple gestation (Twins, triplets, etc.)  . Uncontrolled diabetes or gestational diabetes requiring medication  . Hypertension requiring medication or diagnosis of pre-eclampsia  . Heavy vaginal bleeding  . Non-reassuring fetal heart rate  . Active infection (MRSA, etc.). Group B Strep is NOT a contraindication for waterbirth.  . If your labor has to be induced and induction method requires continuous monitoring of the baby's heart rate  . Other risks/issues identified by your obstetrical provider  Please remember that birth is unpredictable. Under certain unforeseeable circumstances your provider may advise against giving birth in the tub. These decisions will be made on a case-by-case basis and with the safety of you and your baby as our highest priority.  **Please remember that in order to have a waterbirth, you must test Negative to COVID-19 upon admission to the hospital.**   Glucose Tolerance Test During Pregnancy Why am I having this test? The glucose tolerance test (GTT) is done to check how your body processes sugar (glucose). This is one of several tests used to diagnose diabetes that develops during pregnancy (gestational diabetes mellitus). Gestational diabetes is a temporary form of diabetes that some women develop during pregnancy. It usually occurs during the second trimester of pregnancy and goes away after  delivery. Testing (screening) for gestational diabetes usually occurs between 24 and 28 weeks of pregnancy. You may have the GTT test after having a 1-hour glucose screening test if the results from that test indicate that you may have gestational diabetes. You may also have this test if:  You have a history of gestational diabetes.  You have a history of giving birth to  very large babies or have experienced repeated fetal loss (stillbirth).  You have signs and symptoms of diabetes, such as: ? Changes in your vision. ? Tingling or numbness in your hands or feet. ? Changes in hunger, thirst, and urination that are not otherwise explained by your pregnancy. What is being tested? This test measures the amount of glucose in your blood at different times during a period of 3 hours. This indicates how well your body is able to process glucose. What kind of sample is taken?  Blood samples are required for this test. They are usually collected by inserting a needle into a blood vessel. How do I prepare for this test?  For 3 days before your test, eat normally. Have plenty of carbohydrate-rich foods.  Follow instructions from your health care provider about: ? Eating or drinking restrictions on the day of the test. You may be asked to not eat or drink anything other than water (fast) starting 8-10 hours before the test. ? Changing or stopping your regular medicines. Some medicines may interfere with this test. Tell a health care provider about:  All medicines you are taking, including vitamins, herbs, eye drops, creams, and over-the-counter medicines.  Any blood disorders you have.  Any surgeries you have had.  Any medical conditions you have. What happens during the test? First, your blood glucose will be measured. This is referred to as your fasting blood glucose, since you fasted before the test. Then, you will drink a glucose solution that contains a certain amount of glucose. Your blood glucose will be measured again 1, 2, and 3 hours after drinking the solution. This test takes about 3 hours to complete. You will need to stay at the testing location during this time. During the testing period:  Do not eat or drink anything other than the glucose solution.  Do not exercise.  Do not use any products that contain nicotine or tobacco, such as cigarettes  and e-cigarettes. If you need help stopping, ask your health care provider. The testing procedure may vary among health care providers and hospitals. How are the results reported? Your results will be reported as milligrams of glucose per deciliter of blood (mg/dL) or millimoles per liter (mmol/L). Your health care provider will compare your results to normal ranges that were established after testing a large group of people (reference ranges). Reference ranges may vary among labs and hospitals. For this test, common reference ranges are:  Fasting: less than 95-105 mg/dL (5.3-5.8 mmol/L).  1 hour after drinking glucose: less than 180-190 mg/dL (10.0-10.5 mmol/L).  2 hours after drinking glucose: less than 155-165 mg/dL (8.6-9.2 mmol/L).  3 hours after drinking glucose: 140-145 mg/dL (7.8-8.1 mmol/L). What do the results mean? Results within reference ranges are considered normal, meaning that your glucose levels are well-controlled. If two or more of your blood glucose levels are high, you may be diagnosed with gestational diabetes. If only one level is high, your health care provider may suggest repeat testing or other tests to confirm a diagnosis. Talk with your health care provider about what your results mean. Questions to ask  your health care provider Ask your health care provider, or the department that is doing the test:  When will my results be ready?  How will I get my results?  What are my treatment options?  What other tests do I need?  What are my next steps? Summary  The glucose tolerance test (GTT) is one of several tests used to diagnose diabetes that develops during pregnancy (gestational diabetes mellitus). Gestational diabetes is a temporary form of diabetes that some women develop during pregnancy.  You may have the GTT test after having a 1-hour glucose screening test if the results from that test indicate that you may have gestational diabetes. You may also have  this test if you have any symptoms or risk factors for gestational diabetes.  Talk with your health care provider about what your results mean. This information is not intended to replace advice given to you by your health care provider. Make sure you discuss any questions you have with your health care provider. Document Revised: 12/08/2018 Document Reviewed: 03/29/2017 Elsevier Patient Education  [place].

## 2020-01-25 NOTE — Progress Notes (Signed)
ROB   CC: yeast infection from recent antibiotics .  Pt notes relief w/ maternity belt.

## 2020-01-25 NOTE — Progress Notes (Signed)
   PRENATAL VISIT NOTE  Subjective:  Stacy Warner is a 24 y.o. G2P0010 at [redacted]w[redacted]d being seen today for ongoing prenatal care.  She is currently monitored for the following issues for this low-risk pregnancy and has Iron deficiency anemia; Thrombocytosis ([place]); Tobacco abuse; Marijuana abuse; Supervision of other normal pregnancy, antepartum; Asymptomatic bacteriuria during pregnancy in first trimester; Asthma; and Pyelonephritis affecting pregnancy on their problem list.  Patient reports backache that is relieved with use of the maternity belt. She also endorses increased urination of frequency, and intermittent nausea. She is feeling baby move. She denies SOB, chest tightness, abdominal cramps, RUQ pain, dizziness, headaches except for when BP was elevated on April 123XX123 (0000000 systolic). She is having itchiness of the vulva which she thinks is likely due to a yeast infection. She denies painful urination and increased thirst. She is interested in pursuing a water birth.  Contractions: Not present. Vag. Bleeding: None.  Movement: Present. Denies leaking of fluid.   The following portions of the patient's history were reviewed and updated as appropriate: allergies, current medications, past family history, past medical history, past social history, past surgical history and problem list.   Objective:   Vitals:   01/25/20 1455  BP: 116/74  Pulse: 80  Weight: 143 lb (64.9 kg)    Fetal Status: Fetal Heart Rate (bpm): 154   Movement: Present     General:  Alert, oriented and cooperative. Patient is in no acute distress.  Skin: Skin is warm and dry.   Cardiovascular: Regular rate and rhythm, no murmurs, rubs, gallops or extra heart sounds.  Respiratory: All lung fields clear to auscultation bilaterally.  Abdomen: Soft, gravid, appropriate for gestational age.  Pain/Pressure: Absent     Pelvic: Cervical exam deferred.        Extremities: Normal range of motion.  Edema: None, moves all extremities  equally.  Mental Status: Normal mood and affect. Normal behavior. Normal judgment and thought content.   Assessment and Plan:  Pregnancy: G2P0010 at [redacted]w[redacted]d  1. Vaginal discharge: likely due to underlying vaginal candidiasis - Cervicovaginal ancillary only( [place]) - terconazole (TERAZOL 7) 0.4 % vaginal cream; Place 1 applicator vaginally at bedtime. Use for seven days  Dispense: 45 g; Refill: 0  2. Elevated BP without diagnosis of hypertension - patient encouraged to monitor BP and document into Babyscripts  3. Supervision of other normal pregnancy, antepartum - Next appointment scheduled for 02/13/20 with 2 hr. GTT  4. Pyelonephritis affecting pregnancy in second trimester - Continue Keflex  Preterm labor symptoms and general obstetric precautions including but not limited to vaginal bleeding, contractions, leaking of fluid and fetal movement were reviewed in detail with the patient. Please refer to After Visit Summary for other counseling recommendations.   No follow-ups on file.  Future Appointments  Date Time Provider [place]  02/13/2020 12:30 PM [facility] NURSE [facility]  02/13/2020 12:30 PM [facility] US3 [facility] [place], Medical Student

## 2020-01-26 LAB — CERVICOVAGINAL ANCILLARY ONLY
Bacterial Vaginitis (gardnerella): NEGATIVE
Candida Glabrata: NEGATIVE
Candida Vaginitis: POSITIVE — AB
Chlamydia: NEGATIVE
Comment: NEGATIVE
Comment: NEGATIVE
Comment: NEGATIVE
Comment: NEGATIVE
Comment: NEGATIVE
Comment: NORMAL
Neisseria Gonorrhea: NEGATIVE
Trichomonas: NEGATIVE

## 2020-02-13 ENCOUNTER — Encounter (INDEPENDENT_AMBULATORY_CARE_PROVIDER_SITE_OTHER): Payer: Self-pay

## 2020-02-13 ENCOUNTER — Other Ambulatory Visit: Payer: Self-pay

## 2020-02-13 ENCOUNTER — Other Ambulatory Visit: Payer: Self-pay | Admitting: Obstetrics and Gynecology

## 2020-02-13 ENCOUNTER — Ambulatory Visit: Payer: Medicaid Other | Admitting: *Deleted

## 2020-02-13 ENCOUNTER — Ambulatory Visit: Payer: Medicaid Other | Attending: Obstetrics and Gynecology

## 2020-02-13 ENCOUNTER — Encounter: Payer: Self-pay | Admitting: *Deleted

## 2020-02-13 DIAGNOSIS — O365920 Maternal care for other known or suspected poor fetal growth, second trimester, not applicable or unspecified: Secondary | ICD-10-CM | POA: Diagnosis not present

## 2020-02-13 DIAGNOSIS — O359XX0 Maternal care for (suspected) fetal abnormality and damage, unspecified, not applicable or unspecified: Secondary | ICD-10-CM | POA: Diagnosis not present

## 2020-02-13 DIAGNOSIS — O132 Gestational [pregnancy-induced] hypertension without significant proteinuria, second trimester: Secondary | ICD-10-CM | POA: Diagnosis not present

## 2020-02-13 DIAGNOSIS — Z3480 Encounter for supervision of other normal pregnancy, unspecified trimester: Secondary | ICD-10-CM

## 2020-02-13 DIAGNOSIS — J45909 Unspecified asthma, uncomplicated: Secondary | ICD-10-CM

## 2020-02-13 DIAGNOSIS — Z3A25 25 weeks gestation of pregnancy: Secondary | ICD-10-CM

## 2020-02-13 DIAGNOSIS — R8271 Bacteriuria: Secondary | ICD-10-CM | POA: Diagnosis present

## 2020-02-13 DIAGNOSIS — O99891 Other specified diseases and conditions complicating pregnancy: Secondary | ICD-10-CM

## 2020-02-13 DIAGNOSIS — Z363 Encounter for antenatal screening for malformations: Secondary | ICD-10-CM

## 2020-02-15 ENCOUNTER — Other Ambulatory Visit: Payer: Self-pay | Admitting: *Deleted

## 2020-02-15 DIAGNOSIS — O365990 Maternal care for other known or suspected poor fetal growth, unspecified trimester, not applicable or unspecified: Secondary | ICD-10-CM

## 2020-02-23 ENCOUNTER — Other Ambulatory Visit: Payer: Self-pay

## 2020-02-23 ENCOUNTER — Encounter: Payer: Self-pay | Admitting: Obstetrics & Gynecology

## 2020-02-23 ENCOUNTER — Other Ambulatory Visit: Payer: Medicaid Other

## 2020-02-23 ENCOUNTER — Ambulatory Visit (INDEPENDENT_AMBULATORY_CARE_PROVIDER_SITE_OTHER): Payer: Medicaid Other | Admitting: Obstetrics & Gynecology

## 2020-02-23 VITALS — BP 134/92 | HR 79 | Wt 149.0 lb

## 2020-02-23 DIAGNOSIS — O2342 Unspecified infection of urinary tract in pregnancy, second trimester: Secondary | ICD-10-CM

## 2020-02-23 DIAGNOSIS — Z3A27 27 weeks gestation of pregnancy: Secondary | ICD-10-CM

## 2020-02-23 DIAGNOSIS — Z3480 Encounter for supervision of other normal pregnancy, unspecified trimester: Secondary | ICD-10-CM

## 2020-02-23 DIAGNOSIS — O2340 Unspecified infection of urinary tract in pregnancy, unspecified trimester: Secondary | ICD-10-CM

## 2020-02-23 MED ORDER — VITAFOL GUMMIES 3.33-0.333-34.8 MG PO CHEW: 3.0000 | CHEWABLE_TABLET | Freq: Every day | ORAL | 11 refills | Status: DC

## 2020-02-23 MED ORDER — METOCLOPRAMIDE HCL 5 MG PO TABS: 5.0000 mg | ORAL_TABLET | Freq: Three times a day (TID) | ORAL | 1 refills | Status: DC

## 2020-02-23 NOTE — Patient Instructions (Signed)

## 2020-02-23 NOTE — Progress Notes (Signed)
ROB  2 hr GTT today. Pt was not able to tolerate GTT. Will have to have test R/S.   Pt needs Refill on PNV's.   Urine Cx on 01/14/20 had E Coli  Pt asked to leave urine sample today.

## 2020-02-23 NOTE — Progress Notes (Signed)
   PRENATAL VISIT NOTE  Subjective:  Stacy Warner is a 24 y.o. G2P0010 at [redacted]w[redacted]d being seen today for ongoing prenatal care.  She is currently monitored for the following issues for this low-risk pregnancy and has Iron deficiency anemia; Thrombocytosis ([place]); Tobacco abuse; Marijuana abuse; Supervision of other normal pregnancy, antepartum; Asymptomatic bacteriuria during pregnancy in first trimester; Asthma; Pyelonephritis affecting pregnancy; and Elevated BP without diagnosis of hypertension on their problem list.  Patient reports heartburn and nausea.  Contractions: Not present. Vag. Bleeding: None.  Movement: Present. Denies leaking of fluid.   The following portions of the patient's history were reviewed and updated as appropriate: allergies, current medications, past family history, past medical history, past social history, past surgical history and problem list.   Objective:   Vitals:   02/23/20 0817  BP: (!) 134/92  Pulse: 79  Weight: 149 lb (67.6 kg)    Fetal Status:     Movement: Present     General:  Alert, oriented and cooperative. Patient is in no acute distress.  Skin: Skin is warm and dry. No rash noted.   Cardiovascular: Normal heart rate noted  Respiratory: Normal respiratory effort, no problems with respiration noted  Abdomen: Soft, gravid, appropriate for gestational age.  Pain/Pressure: Absent     Pelvic: Cervical exam deferred        Extremities: Normal range of motion.     Mental Status: Normal mood and affect. Normal behavior. Normal judgment and thought content.   Assessment and Plan:  Pregnancy: G2P0010 at [redacted]w[redacted]d 1. Supervision of other normal pregnancy, antepartum Reschedule glucose tolerance, treat reflux and nausea - metoCLOPramide (REGLAN) 5 MG tablet; Take 1 tablet (5 mg total) by mouth 3 (three) times daily before meals.  Dispense: 60 tablet; Refill: 1 - Prenatal Vit-Fe Phos-FA-Omega (VITAFOL GUMMIES) 3.33-0.333-34.8 MG CHEW; Chew 3 each by mouth  daily.  Dispense: 90 tablet; Refill: 11  2. Urinary tract infection in mother during pregnancy, antepartum TOC - Culture, OB Urine  Preterm labor symptoms and general obstetric precautions including but not limited to vaginal bleeding, contractions, leaking of fluid and fetal movement were reviewed in detail with the patient. Please refer to After Visit Summary for other counseling recommendations.   Return in about 1 week (around 03/01/2020) for 2 hr.  Future Appointments  Date Time Provider [place]  02/27/2020  3:15 PM [facility] NURSE [facility]  02/27/2020  3:15 PM [facility] US2 [facility]  03/01/2020  8:00 AM [facility] None  03/05/2020  9:30 AM [facility] NURSE [facility]  03/05/2020  9:30 AM [facility] US3 [facility]  03/08/2020  9:45 AM Shelly Bombard, MD [facility] None  03/12/2020  9:30 AM [facility] NURSE [facility]  03/12/2020  9:30 AM [facility] US3 [facility]    Emeterio Reeve, MD

## 2020-02-25 LAB — URINE CULTURE, OB REFLEX

## 2020-02-25 LAB — CULTURE, OB URINE

## 2020-02-27 ENCOUNTER — Other Ambulatory Visit: Payer: Self-pay

## 2020-02-27 ENCOUNTER — Ambulatory Visit: Payer: Medicaid Other | Admitting: *Deleted

## 2020-02-27 ENCOUNTER — Ambulatory Visit: Payer: Medicaid Other | Attending: Obstetrics and Gynecology

## 2020-02-27 ENCOUNTER — Encounter: Payer: Self-pay | Admitting: *Deleted

## 2020-02-27 DIAGNOSIS — O365920 Maternal care for other known or suspected poor fetal growth, second trimester, not applicable or unspecified: Secondary | ICD-10-CM

## 2020-02-27 DIAGNOSIS — R8271 Bacteriuria: Secondary | ICD-10-CM

## 2020-02-27 DIAGNOSIS — O365990 Maternal care for other known or suspected poor fetal growth, unspecified trimester, not applicable or unspecified: Secondary | ICD-10-CM | POA: Insufficient documentation

## 2020-02-27 DIAGNOSIS — O359XX0 Maternal care for (suspected) fetal abnormality and damage, unspecified, not applicable or unspecified: Secondary | ICD-10-CM | POA: Diagnosis not present

## 2020-02-27 DIAGNOSIS — Z3A27 27 weeks gestation of pregnancy: Secondary | ICD-10-CM

## 2020-02-27 DIAGNOSIS — Z3480 Encounter for supervision of other normal pregnancy, unspecified trimester: Secondary | ICD-10-CM

## 2020-02-27 DIAGNOSIS — O132 Gestational [pregnancy-induced] hypertension without significant proteinuria, second trimester: Secondary | ICD-10-CM

## 2020-02-27 DIAGNOSIS — Z363 Encounter for antenatal screening for malformations: Secondary | ICD-10-CM | POA: Diagnosis not present

## 2020-02-27 DIAGNOSIS — O99891 Other specified diseases and conditions complicating pregnancy: Secondary | ICD-10-CM | POA: Diagnosis present

## 2020-02-27 DIAGNOSIS — J45909 Unspecified asthma, uncomplicated: Secondary | ICD-10-CM

## 2020-03-01 ENCOUNTER — Other Ambulatory Visit: Payer: Self-pay

## 2020-03-01 ENCOUNTER — Other Ambulatory Visit: Payer: Medicaid Other

## 2020-03-01 DIAGNOSIS — Z3480 Encounter for supervision of other normal pregnancy, unspecified trimester: Secondary | ICD-10-CM

## 2020-03-02 LAB — CBC
Hematocrit: 36.7 % (ref 34.0–46.6)
Hemoglobin: 11.8 g/dL (ref 11.1–15.9)
MCH: 29.9 pg (ref 26.6–33.0)
MCHC: 32.2 g/dL (ref 31.5–35.7)
MCV: 93 fL (ref 79–97)
Platelets: 363 10*3/uL (ref 150–450)
RBC: 3.94 x10E6/uL (ref 3.77–5.28)
RDW: 13.8 % (ref 11.7–15.4)
WBC: 12.5 10*3/uL — ABNORMAL HIGH (ref 3.4–10.8)

## 2020-03-02 LAB — GLUCOSE TOLERANCE, 2 HOURS W/ 1HR
Glucose, 1 hour: 134 mg/dL (ref 65–179)
Glucose, 2 hour: 74 mg/dL (ref 65–152)
Glucose, Fasting: 95 mg/dL — ABNORMAL HIGH (ref 65–91)

## 2020-03-02 LAB — RPR: RPR Ser Ql: NONREACTIVE

## 2020-03-02 LAB — HIV ANTIBODY (ROUTINE TESTING W REFLEX): HIV Screen 4th Generation wRfx: NONREACTIVE

## 2020-03-05 ENCOUNTER — Other Ambulatory Visit: Payer: Self-pay

## 2020-03-05 ENCOUNTER — Other Ambulatory Visit: Payer: Self-pay | Admitting: *Deleted

## 2020-03-05 ENCOUNTER — Encounter: Payer: Self-pay | Admitting: Obstetrics & Gynecology

## 2020-03-05 ENCOUNTER — Other Ambulatory Visit: Payer: Self-pay | Admitting: Obstetrics & Gynecology

## 2020-03-05 ENCOUNTER — Ambulatory Visit: Payer: Medicaid Other | Attending: Obstetrics and Gynecology

## 2020-03-05 ENCOUNTER — Ambulatory Visit: Payer: Medicaid Other | Admitting: *Deleted

## 2020-03-05 VITALS — BP 131/82 | HR 93 | Wt 149.0 lb

## 2020-03-05 DIAGNOSIS — O365930 Maternal care for other known or suspected poor fetal growth, third trimester, not applicable or unspecified: Secondary | ICD-10-CM

## 2020-03-05 DIAGNOSIS — R8271 Bacteriuria: Secondary | ICD-10-CM | POA: Diagnosis present

## 2020-03-05 DIAGNOSIS — O359XX0 Maternal care for (suspected) fetal abnormality and damage, unspecified, not applicable or unspecified: Secondary | ICD-10-CM

## 2020-03-05 DIAGNOSIS — O24419 Gestational diabetes mellitus in pregnancy, unspecified control: Secondary | ICD-10-CM | POA: Insufficient documentation

## 2020-03-05 DIAGNOSIS — O133 Gestational [pregnancy-induced] hypertension without significant proteinuria, third trimester: Secondary | ICD-10-CM | POA: Diagnosis not present

## 2020-03-05 DIAGNOSIS — Z3A28 28 weeks gestation of pregnancy: Secondary | ICD-10-CM

## 2020-03-05 DIAGNOSIS — O99891 Other specified diseases and conditions complicating pregnancy: Secondary | ICD-10-CM

## 2020-03-05 DIAGNOSIS — Z362 Encounter for other antenatal screening follow-up: Secondary | ICD-10-CM | POA: Diagnosis not present

## 2020-03-05 DIAGNOSIS — O24410 Gestational diabetes mellitus in pregnancy, diet controlled: Secondary | ICD-10-CM

## 2020-03-05 DIAGNOSIS — J45909 Unspecified asthma, uncomplicated: Secondary | ICD-10-CM

## 2020-03-05 DIAGNOSIS — O365990 Maternal care for other known or suspected poor fetal growth, unspecified trimester, not applicable or unspecified: Secondary | ICD-10-CM | POA: Insufficient documentation

## 2020-03-06 ENCOUNTER — Telehealth: Payer: Self-pay

## 2020-03-06 MED ORDER — ACCU-CHEK GUIDE VI STRP: ORAL_STRIP | 12 refills | Status: DC

## 2020-03-06 MED ORDER — ACCU-CHEK SOFTCLIX LANCETS MISC
12 refills | Status: DC
Start: 2020-03-06 — End: 2020-04-26

## 2020-03-06 MED ORDER — ACCU-CHEK GUIDE W/DEVICE KIT: 1.0000 | PACK | Freq: Every day | 0 refills | Status: DC

## 2020-03-06 NOTE — Telephone Encounter (Signed)
Advised of glucose results, education referral, and supplies being sent to pharmacy, pt agreed.

## 2020-03-07 ENCOUNTER — Other Ambulatory Visit: Payer: Self-pay

## 2020-03-07 ENCOUNTER — Other Ambulatory Visit: Payer: Medicaid Other

## 2020-03-08 ENCOUNTER — Other Ambulatory Visit: Payer: Self-pay

## 2020-03-08 ENCOUNTER — Encounter: Payer: Self-pay | Admitting: Obstetrics

## 2020-03-08 ENCOUNTER — Telehealth (INDEPENDENT_AMBULATORY_CARE_PROVIDER_SITE_OTHER): Payer: Medicaid Other | Admitting: Obstetrics

## 2020-03-08 DIAGNOSIS — R8271 Bacteriuria: Secondary | ICD-10-CM

## 2020-03-08 DIAGNOSIS — O99891 Other specified diseases and conditions complicating pregnancy: Secondary | ICD-10-CM

## 2020-03-08 DIAGNOSIS — Z3A29 29 weeks gestation of pregnancy: Secondary | ICD-10-CM

## 2020-03-08 DIAGNOSIS — O24419 Gestational diabetes mellitus in pregnancy, unspecified control: Secondary | ICD-10-CM

## 2020-03-08 DIAGNOSIS — O0993 Supervision of high risk pregnancy, unspecified, third trimester: Secondary | ICD-10-CM

## 2020-03-08 NOTE — Progress Notes (Signed)
OBSTETRICS PRENATAL VIRTUAL VISIT ENCOUNTER NOTE  Provider location: [facility] [place]   I connected with Stacy Warner on 03/08/20 at  9:45 AM EDT by MyChart Video Encounter at home and verified that I am speaking with the correct person using two identifiers.   I discussed the limitations, risks, security and privacy concerns of performing an evaluation and management service virtually and the availability of in person appointments. I also discussed with the patient that there may be a patient responsible charge related to this service. The patient expressed understanding and agreed to proceed. Subjective:  Stacy Warner is a 24 y.o. G2P0010 at [redacted]w[redacted]d being seen today for ongoing prenatal care.  She is currently monitored for the following issues for this high-risk pregnancy and has Iron deficiency anemia; Thrombocytosis ([place]); Tobacco abuse; Marijuana abuse; Supervision of high-risk pregnancy; Asymptomatic bacteriuria during pregnancy in first trimester; Asthma; Pyelonephritis affecting pregnancy; Elevated BP without diagnosis of hypertension; and Gestational diabetes mellitus (GDM) in third trimester on their problem list.  Patient reports backache.  Contractions: Not present. Vag. Bleeding: None.  Movement: Present. Denies any leaking of fluid.   The following portions of the patient's history were reviewed and updated as appropriate: allergies, current medications, past family history, past medical history, past social history, past surgical history and problem list.   Objective:  There were no vitals filed for this visit.  Fetal Status:     Movement: Present     General:  Alert, oriented and cooperative. Patient is in no acute distress.  Respiratory: Normal respiratory effort, no problems with respiration noted  Mental Status: Normal mood and affect. Normal behavior. Normal judgment and thought content.  Rest of physical exam deferred due to type of  encounter  Imaging: [place] MFM OB FOLLOW UP  Result Date: 03/05/2020 ----------------------------------------------------------------------  OBSTETRICS REPORT                       (Signed Final 03/05/2020 10:49 am) ---------------------------------------------------------------------- Patient Info  ID #:       [id number]                          D.O.B.:  Jun 29, 1996 (24 yrs)  Name:       Stacy Warner                 Visit Date: 03/05/2020 09:53 am ---------------------------------------------------------------------- Performed By  Attending:        Sander Nephew      Ref. Address:     [place], [place]  [place]  Performed By:     Rodrigo Ran BS      [facility]                    RDMS RVT                                 Fetal Care  Referred By:      Shelly Bombard MD ---------------------------------------------------------------------- Orders  #  Description                           Code        Ordered By  1  [place] MFM OB FOLLOW UP                   76816.01    RAVI SHANKAR  2  [place] MFM UA CORD DOPPLER                32992.42    [place] ----------------------------------------------------------------------  #  Order #                     Accession #                Episode #  1  [id number]                   [id number]                 [id number]  2  [id number]                   [id number]                 [id number] ---------------------------------------------------------------------- Indications  Encounter for other antenatal screening        Z36.2  follow-up  Fetal abnormality - other known or             O35.9XX0  suspected (EIF)  Marijuana Use  Elevated blood pressure affecting pregnancy    O13.3  in third trimester  LR NIPS  Asthma                                          O99.89 j45.909  Maternal care for known or suspected poor      O36.5930  fetal growth, third trimester, not applicable or  unspecified IUGR  [redacted] weeks gestation of pregnancy                Z3A.28  Gestational diabetes in pregnancy, diet        O24.410  controlled ---------------------------------------------------------------------- Fetal Evaluation  Num Of Fetuses:         1  Fetal Heart Rate(bpm):  144  Cardiac Activity:       Observed  Presentation:           Cephalic  Placenta:               Posterior  P. Cord Insertion:      Visualized  Amniotic Fluid  AFI FV:      Within normal limits  AFI Sum(cm)     %  Tile       Largest Pocket(cm)  15.4            55          4.9  RUQ(cm)       RLQ(cm)       LUQ(cm)        LLQ(cm)  3.9           3.4           3.2            4.9 ---------------------------------------------------------------------- Biometry  BPD:      70.4  mm     G. Age:  28w 2d         24  %    CI:        72.21   %    70 - 86                                                          FL/HC:      19.5   %    19.6 - 20.8  HC:      263.6  mm     G. Age:  28w 5d         18  %    HC/AC:      1.13        0.99 - 1.21  AC:      232.4  mm     G. Age:  27w 4d         14  %    FL/BPD:     72.9   %    71 - 87  FL:       51.3  mm     G. Age:  27w 3d          8  %    FL/AC:      22.1   %    20 - 24  HUM:      45.8  mm     G. Age:  27w 0d          9  %  Est. FW:    1111  gm      2 lb 7 oz     10  % ---------------------------------------------------------------------- OB History  Gravidity:    2  TOP:          1        Living:  0 ---------------------------------------------------------------------- Gestational Age  LMP:           28w 5d        Date:  08/17/19                 EDD:   05/23/20  U/S Today:     28w 0d                                        EDD:   05/28/20  Best:          28w 5d     Det. By:  LMP  (08/17/19)          EDD:   05/23/20  ---------------------------------------------------------------------- Anatomy  Cranium:  Appears normal         LVOT:                   Appears normal  Cavum:                 Appears normal         Aortic Arch:            Previously seen  Ventricles:            Appears normal         Ductal Arch:            Previously seen  Choroid Plexus:        Appears normal         Diaphragm:              Appears normal  Cerebellum:            Appears normal         Stomach:                Appears normal, left                                                                        sided  Posterior Fossa:       Previously seen        Abdomen:                Appears normal  Nuchal Fold:           Not applicable (>29    Abdominal Wall:         Previously seen                         wks GA)  Face:                  Appears normal         Cord Vessels:           Previously seen                         (orbits and profile)  Lips:                  Appears normal         Kidneys:                Appear normal  Palate:                Not well visualized    Bladder:                Appears normal  Thoracic:              Appears normal         Spine:                  Previously seen  Heart:                 Echogenic focus        Upper Extremities:      Previously seen  in LV  RVOT:                  Appears normal         Lower Extremities:      Previously seen  Other:  Female gender previously seen. 3VV and 3VTV previously visualized.          Nasal bone previously visualized. Open hands, Heels and 5th digit          previously visualized. ---------------------------------------------------------------------- Doppler - Fetal Vessels  Umbilical Artery   S/D     %tile      RI    %tile                             ADFV    RDFV   3.53       78    0.77       94                                No      No ---------------------------------------------------------------------- Cervix Uterus Adnexa  Cervix  Not  visualized (advanced GA >24wks)  Uterus  No abnormality visualized.  Right Ovary  Within normal limits.  Left Ovary  Not visualized.  Cul De Sac  No free fluid seen.  Adnexa  No abnormality visualized. ---------------------------------------------------------------------- Impression  Follow up growth and amniotic fluid due to IUGR  Normal interval growth consistent with IUGR EFW 10th%  There is good fetal movement and amniotic fluid. The UA  Dopplers are normal ---------------------------------------------------------------------- Recommendations  Continue weekly testing with UA Dopplers  Repeat growth in 3-4 weeks. ----------------------------------------------------------------------               Sander Nephew, MD Electronically Signed Final Report   03/05/2020 10:49 am ----------------------------------------------------------------------  [place] MFM OB FOLLOW UP  Result Date: 02/13/2020 ----------------------------------------------------------------------  OBSTETRICS REPORT                       (Signed Final 02/13/2020 01:55 pm) ---------------------------------------------------------------------- Patient Info  ID #:       [id number]                          D.O.B.:  1996-08-02 (24 yrs)  Name:       Stacy Warner                 Visit Date: 02/13/2020 01:29 pm ---------------------------------------------------------------------- Performed By  Attending:        Tama High MD        Ref. Address:     [place], [place]  [place]  Performed By:     Dorena Dew     Location:         [facility]                    BS, RDMS                                 Fetal Care  Referred By:      Shelly Bombard MD ----------------------------------------------------------------------  Orders  #  Description                           Code        Ordered By  1  [place] MFM OB FOLLOW UP                   76816.01    RAVI SHANKAR  2  [place] MFM UA CORD DOPPLER                35329.92    RAVI [facility] ----------------------------------------------------------------------  #  Order #                     Accession #                Episode #  1  [id number]                   [id number]                 [id number]  2  [id number]                   [id number]                 [id number] ---------------------------------------------------------------------- Indications  Fetal abnormality - other known or             O35.9XX0  suspected (EIF)  [redacted] weeks gestation of pregnancy                Z3A.25  Marijuana Use  Encounter for antenatal screening for          Z36.3  malformations  Elevated blood pressure affecting pregnancy    O13.2  in second trimester  LR NIPS  Asthma                                         O99.89 j45.909 ---------------------------------------------------------------------- Fetal Evaluation  Num Of Fetuses:         1  Fetal Heart Rate(bpm):  164  Cardiac Activity:       Observed  Presentation:           Cephalic  Placenta:               Posterior  P. Cord Insertion:      Previously Visualized  Amniotic Fluid  AFI FV:      Within normal limits                              Largest Pocket(cm)  5.7 ---------------------------------------------------------------------- Biometry  BPD:      64.6  mm     G. Age:  26w 1d         55  %    CI:        73.56   %    70 - 86                                                          FL/HC:      19.3   %    18.6 - 20.4  HC:      239.3  mm     G. Age:  26w 0d         35  %    HC/AC:      1.24        1.04 - 1.22  AC:      193.5  mm     G. Age:  24w 0d          5  %    FL/BPD:     71.7   %    71 - 87  FL:       46.3  mm     G. Age:  25w 3d         26  %    FL/AC:      23.9   %    20 - 24  HUM:      40.9  mm     G. Age:  24w 6d         22  %  LV:         4.8  mm  Est. FW:     740  gm    1 lb 10 oz      11  % ---------------------------------------------------------------------- OB History  Gravidity:    2  TOP:          1        Living:  0 ---------------------------------------------------------------------- Gestational Age  LMP:           25w 5d        Date:  08/17/19                 EDD:   05/23/20  U/S Today:     25w 3d                                        EDD:   05/25/20  Best:          25w 5d     Det. By:  LMP  (08/17/19)          EDD:   05/23/20 ---------------------------------------------------------------------- Anatomy  Cranium:               Appears normal         LVOT:                   Previously seen  Cavum:                 Previously seen        Aortic Arch:            Previously  seen  Ventricles:            Appears normal         Ductal Arch:            Previously seen  Choroid Plexus:        Previously seen        Diaphragm:              Appears normal  Cerebellum:            Previously seen        Stomach:                Appears normal, left                                                                        sided  Posterior Fossa:       Previously seen        Abdomen:                Appears normal  Nuchal Fold:           Not applicable (>38    Abdominal Wall:         Previously seen                         wks GA)  Face:                  Orbits and profile     Cord Vessels:           Previously seen                         previously seen  Lips:                  Previously seen        Kidneys:                Appear normal  Palate:                Not well visualized    Bladder:                Appears normal  Thoracic:              Appears normal         Spine:                  Previously seen  Heart:                 Echogenic focus        Upper Extremities:      Previously seen                         in LV  RVOT:                  Appears normal         Lower Extremities:      Previously seen  Other:  Female gender previously seen. 3VV and  3VTV previously visualized.  Nasal bone previously visualized. Open hands, Heels and 5th digit          previously visualized. ---------------------------------------------------------------------- Doppler - Fetal Vessels  Umbilical Artery   S/D     %tile                                              ADFV    RDFV   4.71       95                                                 No      No ---------------------------------------------------------------------- Impression  Patient returns for fetal growth assessment.  On ultrasound, the estimated fetal weight is at the 11th  percentile. Abdominal circumference measurement is at the  5th percentile. Amniotic fluid is normal and good fetal activity  seen. Umbilical artery Doppler showed normal forward  diastolic flow.  I explained the findings consistent with fetal growth restriction  based on abdominal circumference measurements (less than  the 5th percentile). I informed her that fetal growth restriction  is difficult to differentiate from small for gestational age fetus.  I explained possible causes including placental insufficiency. ---------------------------------------------------------------------- Recommendations  -UA Doppler in 2 weeks.  -Fetal growth assessment and UA Doppler in 3 weeks. ----------------------------------------------------------------------                  Tama High, MD Electronically Signed Final Report   02/13/2020 01:55 pm ----------------------------------------------------------------------  [place] MFM UA CORD DOPPLER  Result Date: 03/05/2020 ----------------------------------------------------------------------  OBSTETRICS REPORT                       (Signed Final 03/05/2020 10:49 am) ---------------------------------------------------------------------- Patient Info  ID #:       [id number]                          D.O.B.:  06-02-1996 (24 yrs)  Name:       Stacy Warner                 Visit Date: 03/05/2020 09:53 am  ---------------------------------------------------------------------- Performed By  Attending:        Sander Nephew      Ref. Address:     [place], [place]  [place]  Performed By:     Rodrigo Ran BS      [facility]                    RDMS RVT                                 Fetal Care  Referred By:      Shelly Bombard MD ---------------------------------------------------------------------- Orders  #  Description                           Code        Ordered By  1  [place] MFM OB FOLLOW UP                   76816.01    RAVI SHANKAR  2  [place] MFM UA CORD DOPPLER                26203.55    [place] ----------------------------------------------------------------------  #  Order #                     Accession #                Episode #  1  [id number]                   [id number]                 [id number]  2  [id number]                   [id number]                 [id number] ---------------------------------------------------------------------- Indications  Encounter for other antenatal screening        Z36.2  follow-up  Fetal abnormality - other known or             O35.9XX0  suspected (EIF)  Marijuana Use  Elevated blood pressure affecting pregnancy    O13.3  in third trimester  LR NIPS  Asthma                                         O99.89 j45.909  Maternal care for known or suspected poor      O36.5930  fetal growth, third trimester, not applicable or  unspecified IUGR  [redacted] weeks gestation of pregnancy                Z3A.28  Gestational diabetes in pregnancy, diet        O24.410  controlled ---------------------------------------------------------------------- Fetal Evaluation  Num Of Fetuses:         1  Fetal Heart Rate(bpm):  144  Cardiac  Activity:       Observed  Presentation:           Cephalic  Placenta:               Posterior  P. Cord Insertion:      Visualized  Amniotic Fluid  AFI FV:      Within normal limits  AFI Sum(cm)     %  Tile       Largest Pocket(cm)  15.4            55          4.9  RUQ(cm)       RLQ(cm)       LUQ(cm)        LLQ(cm)  3.9           3.4           3.2            4.9 ---------------------------------------------------------------------- Biometry  BPD:      70.4  mm     G. Age:  28w 2d         24  %    CI:        72.21   %    70 - 86                                                          FL/HC:      19.5   %    19.6 - 20.8  HC:      263.6  mm     G. Age:  28w 5d         18  %    HC/AC:      1.13        0.99 - 1.21  AC:      232.4  mm     G. Age:  27w 4d         14  %    FL/BPD:     72.9   %    71 - 87  FL:       51.3  mm     G. Age:  27w 3d          8  %    FL/AC:      22.1   %    20 - 24  HUM:      45.8  mm     G. Age:  27w 0d          9  %  Est. FW:    1111  gm      2 lb 7 oz     10  % ---------------------------------------------------------------------- OB History  Gravidity:    2  TOP:          1        Living:  0 ---------------------------------------------------------------------- Gestational Age  LMP:           28w 5d        Date:  08/17/19                 EDD:   05/23/20  U/S Today:     28w 0d                                        EDD:   05/28/20  Best:          28w 5d     Det. By:  LMP  (08/17/19)          EDD:   05/23/20 ---------------------------------------------------------------------- Anatomy  Cranium:  Appears normal         LVOT:                   Appears normal  Cavum:                 Appears normal         Aortic Arch:            Previously seen  Ventricles:            Appears normal         Ductal Arch:            Previously seen  Choroid Plexus:        Appears normal         Diaphragm:              Appears normal  Cerebellum:            Appears normal         Stomach:                 Appears normal, left                                                                        sided  Posterior Fossa:       Previously seen        Abdomen:                Appears normal  Nuchal Fold:           Not applicable (>40    Abdominal Wall:         Previously seen                         wks GA)  Face:                  Appears normal         Cord Vessels:           Previously seen                         (orbits and profile)  Lips:                  Appears normal         Kidneys:                Appear normal  Palate:                Not well visualized    Bladder:                Appears normal  Thoracic:              Appears normal         Spine:                  Previously seen  Heart:                 Echogenic focus        Upper Extremities:      Previously seen  in LV  RVOT:                  Appears normal         Lower Extremities:      Previously seen  Other:  Female gender previously seen. 3VV and 3VTV previously visualized.          Nasal bone previously visualized. Open hands, Heels and 5th digit          previously visualized. ---------------------------------------------------------------------- Doppler - Fetal Vessels  Umbilical Artery   S/D     %tile      RI    %tile                             ADFV    RDFV   3.53       78    0.77       94                                No      No ---------------------------------------------------------------------- Cervix Uterus Adnexa  Cervix  Not visualized (advanced GA >24wks)  Uterus  No abnormality visualized.  Right Ovary  Within normal limits.  Left Ovary  Not visualized.  Cul De Sac  No free fluid seen.  Adnexa  No abnormality visualized. ---------------------------------------------------------------------- Impression  Follow up growth and amniotic fluid due to IUGR  Normal interval growth consistent with IUGR EFW 10th%  There is good fetal movement and amniotic fluid. The UA  Dopplers are normal  ---------------------------------------------------------------------- Recommendations  Continue weekly testing with UA Dopplers  Repeat growth in 3-4 weeks. ----------------------------------------------------------------------               Sander Nephew, MD Electronically Signed Final Report   03/05/2020 10:49 am ----------------------------------------------------------------------  [place] MFM UA CORD DOPPLER  Result Date: 02/27/2020 ----------------------------------------------------------------------  OBSTETRICS REPORT                       (Signed Final 02/27/2020 04:17 pm) ---------------------------------------------------------------------- Patient Info  ID #:       [id number]                          D.O.B.:  27-Feb-1996 (24 yrs)  Name:       Stacy Warner                 Visit Date: 02/27/2020 03:37 pm ---------------------------------------------------------------------- Performed By  Attending:        Tama High MD        Ref. Address:     [place], [place]  [place]  Performed By:     Wilnette Kales        Location[facility]                    RDMS,RVT                                 Fetal Care  Referred By:      Shelly Bombard MD ---------------------------------------------------------------------- Orders  #  Description                           Code        Ordered By  1  [place] MFM UA CORD DOPPLER                [phone]    [facility] ----------------------------------------------------------------------  #  Order #                     Accession #                Episode #  1  [id number]                   [id number]                 [id number] ---------------------------------------------------------------------- Indications  [redacted] weeks gestation of pregnancy                 Z3A.27  Fetal abnormality - other known or             O35.9XX0  suspected (EIF)  Marijuana Use  Encounter for antenatal screening for          Z36.3  malformations  Elevated blood pressure affecting pregnancy    O13.2  in second trimester  LR NIPS  Asthma                                         O99.89 j45.909  Maternal care for known or suspected poor      O36.5920  fetal growth, second trimester, not applicable  or unspecified IUGR ---------------------------------------------------------------------- Fetal Evaluation  Num Of Fetuses:         1  Fetal Heart Rate(bpm):  150  Cardiac Activity:       Observed  Presentation:           Cephalic  Placenta:               Posterior  P. Cord Insertion:      Visualized, central  Amniotic Fluid  AFI FV:      Within normal limits                              Largest Pocket(cm)                              6 ---------------------------------------------------------------------- OB History  Gravidity:    2  TOP:          1  Living:  0 ---------------------------------------------------------------------- Gestational Age  LMP:           27w 5d        Date:  08/17/19                 EDD:   05/23/20  Best:          27w 5d     Det. By:  LMP  (08/17/19)          EDD:   05/23/20 ---------------------------------------------------------------------- Anatomy  Cranium:               Appears normal         Ductal Arch:            Appears normal  Ventricles:            Appears normal         Diaphragm:              Appears normal  Posterior Fossa:       Appears normal         Stomach:                Appears normal, left                                                                        sided  Nuchal Fold:           Appears normal         Abdomen:                Appears normal  Heart:                 Appears normal         Cord Vessels:           Appears normal (3                         (4CH, axis, and                                vessel cord)                          situs)  RVOT:                  Appears normal         Kidneys:                Appear normal  Aortic Arch:           Appears normal         Bladder:                Appears normal  Other:  Fetus appears to be female. ---------------------------------------------------------------------- Doppler - Fetal Vessels  Umbilical Artery   S/D     %tile      RI    %tile                             ADFV  RDFV   3.88       85    0.74       83                                No      No ---------------------------------------------------------------------- Impression  Fetal growth restriction.  Amniotic fluid is normal and good fetal activity is seen.  Umbilical artery Doppler showed normal forward diastolic flow  .  We reassured the patient of the findings. ---------------------------------------------------------------------- Recommendations  -Fetal growth assessment next week.  -UA Doppler next week. ----------------------------------------------------------------------                  Tama High, MD Electronically Signed Final Report   02/27/2020 04:17 pm ----------------------------------------------------------------------  [place] MFM UA CORD DOPPLER  Result Date: 02/13/2020 ----------------------------------------------------------------------  OBSTETRICS REPORT                       (Signed Final 02/13/2020 01:55 pm) ---------------------------------------------------------------------- Patient Info  ID #:       [id number]                          D.O.B.:  1995-09-27 (24 yrs)  Name:       Stacy Warner                 Visit Date: 02/13/2020 01:29 pm ---------------------------------------------------------------------- Performed By  Attending:        Tama High MD        Ref. Address:     [address], [place]                                                             [place], [place]   Performed By:     Dorena Dew     Location:         [facility]                    BS, RDMS                                 Fetal Care  Referred By:      Shelly Bombard MD ---------------------------------------------------------------------- Orders  #  Description  Code        Ordered By  1  [place] MFM OB FOLLOW UP                   [id number]    RAVI SHANKAR  2  [place] MFM UA CORD DOPPLER                [id number]    [facility] ----------------------------------------------------------------------  #  Order #                     Accession #                Episode #  1  [id number]                   [id number]                 [id number]  2  [id number]                   [id number]                 [id number] ---------------------------------------------------------------------- Indications  Fetal abnormality - other known or             O35.9XX0  suspected (EIF)  [redacted] weeks gestation of pregnancy                Z3A.25  Marijuana Use  Encounter for antenatal screening for          Z36.3  malformations  Elevated blood pressure affecting pregnancy    O13.2  in second trimester  LR NIPS  Asthma                                         O99.89 j45.909 ---------------------------------------------------------------------- Fetal Evaluation  Num Of Fetuses:         1  Fetal Heart Rate(bpm):  164  Cardiac Activity:       Observed  Presentation:           Cephalic  Placenta:               Posterior  P. Cord Insertion:      Previously Visualized  Amniotic Fluid  AFI FV:      Within normal limits                              Largest Pocket(cm)                              5.7 ---------------------------------------------------------------------- Biometry  BPD:      64.6  mm     G. Age:  26w 1d         55  %    CI:        73.56   %    70 - 86                                                          FL/HC:      19.3   %    18.6 - 20.4  HC:      239.3  mm     G. Age:  26w 0d         35  %     HC/AC:      1.24        1.04 - 1.22  AC:      193.5  mm     G. Age:  24w 0d          5  %    FL/BPD:     71.7   %    71 - 87  FL:       46.3  mm     G. Age:  25w 3d         26  %    FL/AC:      23.9   %    20 - 24  HUM:      40.9  mm     G. Age:  24w 6d         22  %  LV:        4.8  mm  Est. FW:     740  gm    1 lb 10 oz      11  % ---------------------------------------------------------------------- OB History  Gravidity:    2  TOP:          1        Living:  0 ---------------------------------------------------------------------- Gestational Age  LMP:           25w 5d        Date:  08/17/19                 EDD:   05/23/20  U/S Today:     25w 3d                                        EDD:   05/25/20  Best:          25w 5d     Det. By:  LMP  (08/17/19)          EDD:   05/23/20 ---------------------------------------------------------------------- Anatomy  Cranium:               Appears normal         LVOT:                   Previously seen  Cavum:                 Previously seen        Aortic Arch:            Previously seen  Ventricles:            Appears normal         Ductal Arch:            Previously seen  Choroid Plexus:        Previously seen        Diaphragm:              Appears normal  Cerebellum:            Previously seen        Stomach:                Appears normal, left  sided  Posterior Fossa:       Previously seen        Abdomen:                Appears normal  Nuchal Fold:           Not applicable (>21    Abdominal Wall:         Previously seen                         wks GA)  Face:                  Orbits and profile     Cord Vessels:           Previously seen                         previously seen  Lips:                  Previously seen        Kidneys:                Appear normal  Palate:                Not well visualized    Bladder:                Appears normal  Thoracic:              Appears normal         Spine:                   Previously seen  Heart:                 Echogenic focus        Upper Extremities:      Previously seen                         in LV  RVOT:                  Appears normal         Lower Extremities:      Previously seen  Other:  Female gender previously seen. 3VV and 3VTV previously visualized.          Nasal bone previously visualized. Open hands, Heels and 5th digit          previously visualized. ---------------------------------------------------------------------- Doppler - Fetal Vessels  Umbilical Artery   S/D     %tile                                              ADFV    RDFV   4.71       95                                                 No      No ---------------------------------------------------------------------- Impression  Patient returns for fetal growth assessment.  On ultrasound, the estimated fetal weight is at the 11th  percentile. Abdominal circumference measurement is at the  5th percentile. Amniotic fluid is  normal and good fetal activity  seen. Umbilical artery Doppler showed normal forward  diastolic flow.  I explained the findings consistent with fetal growth restriction  based on abdominal circumference measurements (less than  the 5th percentile). I informed her that fetal growth restriction  is difficult to differentiate from small for gestational age fetus.  I explained possible causes including placental insufficiency. ---------------------------------------------------------------------- Recommendations  -UA Doppler in 2 weeks.  -Fetal growth assessment and UA Doppler in 3 weeks. ----------------------------------------------------------------------                  Tama High, MD Electronically Signed Final Report   02/13/2020 01:55 pm ----------------------------------------------------------------------   Assessment and Plan:  Pregnancy: G2P0010 at [redacted]w[redacted]d 1. Supervision of high risk pregnancy in third trimester  2. Gestational diabetes mellitus (GDM) in third  trimester, gestational diabetes method of control unspecified - newly diagnosed.  Has not started monitoring glucose yet  3. Asymptomatic bacteriuria during pregnancy in first trimester  treat in labor   Preterm labor symptoms and general obstetric precautions including but not limited to vaginal bleeding, contractions, leaking of fluid and fetal movement were reviewed in detail with the patient. I discussed the assessment and treatment plan with the patient. The patient was provided an opportunity to ask questions and all were answered. The patient agreed with the plan and demonstrated an understanding of the instructions. The patient was advised to call back or seek an in-person office evaluation/go to [facility]U at [facility] for any urgent or concerning symptoms. Please refer to After Visit Summary for other counseling recommendations.   I provided 10 minutes of face-to-face time during this encounter.  Return in about 2 weeks (around 03/22/2020) for MyChart HOB-Faculty Only.  Future Appointments  Date Time Provider [place]  03/12/2020  9:30 AM [facility] NURSE [facility]  03/12/2020  9:30 AM [facility] US3 [facility]  03/14/2020  1:15 PM [facility]EDUCATION [facility]  03/19/2020  3:30 PM [facility] NURSE [facility]  03/19/2020  3:45 PM [facility] US5 [facility]  03/26/2020  3:30 PM [facility] NURSE [facility]  03/26/2020  3:45 PM [facility] US5 [facility]  04/02/2020  2:30 PM [facility] NURSE [facility]  04/02/2020  2:45 PM [facility] US5 [facility] [place]     Shelly Bombard, MD 03/08/2020 10:01 AM

## 2020-03-08 NOTE — Progress Notes (Signed)
Pt failed GTT this week. Made aware of GDM and discussed need for N&D class.  Please discuss diabetes in pregnancy.   Pt is unable to take BP today.

## 2020-03-11 ENCOUNTER — Other Ambulatory Visit: Payer: Self-pay | Admitting: Advanced Practice Midwife

## 2020-03-11 DIAGNOSIS — O219 Vomiting of pregnancy, unspecified: Secondary | ICD-10-CM

## 2020-03-11 MED ORDER — ONDANSETRON HCL 4 MG PO TABS: 4.0000 mg | ORAL_TABLET | Freq: Three times a day (TID) | ORAL | 2 refills | Status: DC | PRN

## 2020-03-11 NOTE — Progress Notes (Signed)
Reglan discontinued and Zofran Rx sent at pt request.

## 2020-03-12 ENCOUNTER — Other Ambulatory Visit: Payer: Self-pay

## 2020-03-12 ENCOUNTER — Ambulatory Visit: Payer: Medicaid Other | Admitting: *Deleted

## 2020-03-12 ENCOUNTER — Encounter: Payer: Medicaid Other | Admitting: Registered"

## 2020-03-12 ENCOUNTER — Ambulatory Visit: Payer: Medicaid Other | Attending: Obstetrics and Gynecology

## 2020-03-12 DIAGNOSIS — R8271 Bacteriuria: Secondary | ICD-10-CM | POA: Insufficient documentation

## 2020-03-12 DIAGNOSIS — O365990 Maternal care for other known or suspected poor fetal growth, unspecified trimester, not applicable or unspecified: Secondary | ICD-10-CM | POA: Diagnosis present

## 2020-03-12 DIAGNOSIS — O99891 Other specified diseases and conditions complicating pregnancy: Secondary | ICD-10-CM | POA: Diagnosis present

## 2020-03-12 DIAGNOSIS — O0993 Supervision of high risk pregnancy, unspecified, third trimester: Secondary | ICD-10-CM

## 2020-03-12 DIAGNOSIS — J45909 Unspecified asthma, uncomplicated: Secondary | ICD-10-CM

## 2020-03-12 DIAGNOSIS — O24410 Gestational diabetes mellitus in pregnancy, diet controlled: Secondary | ICD-10-CM | POA: Diagnosis not present

## 2020-03-12 DIAGNOSIS — O365930 Maternal care for other known or suspected poor fetal growth, third trimester, not applicable or unspecified: Secondary | ICD-10-CM

## 2020-03-12 DIAGNOSIS — O133 Gestational [pregnancy-induced] hypertension without significant proteinuria, third trimester: Secondary | ICD-10-CM

## 2020-03-12 DIAGNOSIS — O359XX0 Maternal care for (suspected) fetal abnormality and damage, unspecified, not applicable or unspecified: Secondary | ICD-10-CM | POA: Diagnosis not present

## 2020-03-12 DIAGNOSIS — Z3A29 29 weeks gestation of pregnancy: Secondary | ICD-10-CM

## 2020-03-14 ENCOUNTER — Ambulatory Visit: Payer: Medicaid Other | Admitting: Registered"

## 2020-03-14 ENCOUNTER — Encounter: Payer: Medicaid Other | Attending: Obstetrics & Gynecology | Admitting: Registered"

## 2020-03-14 ENCOUNTER — Other Ambulatory Visit: Payer: Self-pay

## 2020-03-14 DIAGNOSIS — Z3A00 Weeks of gestation of pregnancy not specified: Secondary | ICD-10-CM | POA: Insufficient documentation

## 2020-03-14 DIAGNOSIS — O24419 Gestational diabetes mellitus in pregnancy, unspecified control: Secondary | ICD-10-CM

## 2020-03-15 NOTE — Progress Notes (Signed)
This patient is accompanied in the office by her mother.  Patient was seen on 02/13/20 for Gestational Diabetes self-management. EDD 05/23/20. Patient states no history of GDM. Diet history obtained. Patient eats variety of all food groups. Beverages include sweet tea water.  Patient is likely consuming excess carbohydrates in the form of sweet tea.   The following learning objectives were met by the patient :   States the definition of Gestational Diabetes  States why dietary management is important in controlling blood glucose  Describes the effects of carbohydrates on blood glucose levels  Demonstrates ability to create a balanced meal plan  Demonstrates carbohydrate counting   States when to check blood glucose levels  Demonstrates proper blood glucose monitoring techniques  States the effect of stress and exercise on blood glucose levels  States the importance of limiting caffeine and abstaining from alcohol and smoking  Plan:  Aim for 3 Carbohydrate Choices per meal (45 grams) +/- 1 either way  Aim for 1-2 Carbohydrate Choices per snack Begin reading food labels for Total Carbohydrate of foods If OK with your MD, consider  increasing your activity level by walking, Arm Chair Exercises or other activity daily as tolerated Begin checking Blood Glucose before breakfast and 2 hours after first bite of breakfast, lunch and dinner as directed by MD  Bring Log Book/Sheet and meter to every medical appointment  Baby Scripts: not enrolling at this time Take medication if directed by MD  Patient already has a meter and received instruction in how to use. CBG 107 mg/dL  Patient instructed to monitor glucose levels: FBS: 60 - 95 mg/dl 2 hour: <120 mg/dl  Patient received the following handouts:  Nutrition Diabetes and Pregnancy  Carbohydrate Counting List  Blood glucose Log Sheet  Patient will be seen for follow-up as needed.

## 2020-03-19 ENCOUNTER — Ambulatory Visit: Payer: Medicaid Other | Attending: Maternal & Fetal Medicine

## 2020-03-19 ENCOUNTER — Other Ambulatory Visit: Payer: Self-pay

## 2020-03-19 ENCOUNTER — Encounter: Payer: Self-pay | Admitting: *Deleted

## 2020-03-19 ENCOUNTER — Ambulatory Visit: Payer: Medicaid Other | Admitting: *Deleted

## 2020-03-19 ENCOUNTER — Encounter: Payer: Self-pay | Admitting: Obstetrics

## 2020-03-19 DIAGNOSIS — R8271 Bacteriuria: Secondary | ICD-10-CM | POA: Insufficient documentation

## 2020-03-19 DIAGNOSIS — O24410 Gestational diabetes mellitus in pregnancy, diet controlled: Secondary | ICD-10-CM

## 2020-03-19 DIAGNOSIS — O133 Gestational [pregnancy-induced] hypertension without significant proteinuria, third trimester: Secondary | ICD-10-CM

## 2020-03-19 DIAGNOSIS — O359XX0 Maternal care for (suspected) fetal abnormality and damage, unspecified, not applicable or unspecified: Secondary | ICD-10-CM

## 2020-03-19 DIAGNOSIS — Z362 Encounter for other antenatal screening follow-up: Secondary | ICD-10-CM

## 2020-03-19 DIAGNOSIS — O99891 Other specified diseases and conditions complicating pregnancy: Secondary | ICD-10-CM | POA: Insufficient documentation

## 2020-03-19 DIAGNOSIS — J45909 Unspecified asthma, uncomplicated: Secondary | ICD-10-CM

## 2020-03-19 DIAGNOSIS — Z3A30 30 weeks gestation of pregnancy: Secondary | ICD-10-CM

## 2020-03-19 DIAGNOSIS — O365930 Maternal care for other known or suspected poor fetal growth, third trimester, not applicable or unspecified: Secondary | ICD-10-CM | POA: Diagnosis present

## 2020-03-21 ENCOUNTER — Other Ambulatory Visit: Payer: Self-pay

## 2020-03-22 ENCOUNTER — Telehealth (INDEPENDENT_AMBULATORY_CARE_PROVIDER_SITE_OTHER): Payer: Medicaid Other | Admitting: Obstetrics and Gynecology

## 2020-03-22 VITALS — BP 124/82 | HR 88

## 2020-03-22 DIAGNOSIS — J4520 Mild intermittent asthma, uncomplicated: Secondary | ICD-10-CM

## 2020-03-22 DIAGNOSIS — O24410 Gestational diabetes mellitus in pregnancy, diet controlled: Secondary | ICD-10-CM

## 2020-03-22 DIAGNOSIS — D509 Iron deficiency anemia, unspecified: Secondary | ICD-10-CM

## 2020-03-22 DIAGNOSIS — D473 Essential (hemorrhagic) thrombocythemia: Secondary | ICD-10-CM

## 2020-03-22 DIAGNOSIS — O0993 Supervision of high risk pregnancy, unspecified, third trimester: Secondary | ICD-10-CM

## 2020-03-22 DIAGNOSIS — O99891 Other specified diseases and conditions complicating pregnancy: Secondary | ICD-10-CM

## 2020-03-22 DIAGNOSIS — O99513 Diseases of the respiratory system complicating pregnancy, third trimester: Secondary | ICD-10-CM

## 2020-03-22 DIAGNOSIS — R030 Elevated blood-pressure reading, without diagnosis of hypertension: Secondary | ICD-10-CM

## 2020-03-22 DIAGNOSIS — D75839 Thrombocytosis, unspecified: Secondary | ICD-10-CM

## 2020-03-22 DIAGNOSIS — R8271 Bacteriuria: Secondary | ICD-10-CM

## 2020-03-22 DIAGNOSIS — Z3A31 31 weeks gestation of pregnancy: Secondary | ICD-10-CM

## 2020-03-22 NOTE — Patient Instructions (Signed)
Gestational Diabetes Mellitus, Self Care When you have gestational diabetes (gestational diabetes mellitus), you must make sure your blood sugar (glucose) stays in a healthy range. You can do this with:  Nutrition.  Exercise.  Lifestyle changes.  Medicines or insulin, if needed.  Support from your doctors and others. If you get treated for this condition, it may not hurt you or your unborn baby (fetus). If you do not get treated for this condition, it may cause problems that can hurt you or your unborn baby. If you get gestational diabetes, you are:  More likely to get it if you get pregnant again.  More likely to develop type 2 diabetes in the future. How to stay aware of blood sugar   Check your blood sugar every day while you are pregnant. Check it as often as told.  Call your doctor if your blood sugar is above your goal numbers for two tests in a row. Your doctor will set personal treatment goals for you. Generally, you should have these blood sugar levels:  Before meals, or after not eating for a long time (fasting or preprandial): at or below 95 mg/dL (5.3 mmol/L).  After meals (postprandial): ? One hour after a meal: at or below 140 mg/dL (7.8 mmol/L). ? Two hours after a meal: at or below 120 mg/dL (6.7 mmol/L).  A1c (hemoglobin A1c) level: 6-6.5%. How to manage high and low blood sugar Signs of high blood sugar High blood sugar is called hyperglycemia. Know the early signs of high blood sugar. Signs may include:  Feeling: ? Thirsty. ? Hungry. ? Very tired.  Needing to pee (urinate) more than usual.  Blurry vision. Signs of low blood sugar Low blood sugar is called hypoglycemia. This is when blood sugar is at or below 70 mg/dL (3.9 mmol/L). Signs may include:  Feeling: ? Hungry. ? Worried or nervous (anxious). ? Sweaty and clammy. ? Confused. ? Dizzy. ? Sleepy. ? Sick to your stomach (nauseous).  Having: ? A fast heartbeat. ? A headache. ? A change  in your vision. ? Tingling or no feeling (numbness) around your mouth, lips, or tongue. ? Jerky movements that you cannot control (seizure).  Having trouble with: ? Moving (coordination). ? Sleeping. ? Passing out (fainting). ? Getting upset easily (irritability). Treating low blood sugar To treat low blood sugar, eat or drink something sugary right away. If you can think clearly and swallow safely, follow the 15:15 rule:  Take 15 grams of a fast-acting carb (carbohydrate). Talk with your doctor about how much you should take.  Some fast-acting carbs are: ? Sugar tablets (glucose pills). Take 3-4 glucose pills. ? 6-8 pieces of hard candy. ? 4-6 oz (120-150 mL) of fruit juice. ? 4-6 oz (120-150 mL) of regular (not diet) soda. ? 1 Tbsp (15 mL) honey or sugar.  Check your blood sugar 15 minutes after you take the carb.  If your blood sugar is still at or below 70 mg/dL (3.9 mmol/L), take 15 grams of a carb again.  If your blood sugar does not go above 70 mg/dL (3.9 mmol/L) after 3 tries, get help right away.  After your blood sugar goes back to normal, eat a meal or a snack within 1 hour. Treating very low blood sugar If your blood sugar is at or below 54 mg/dL (3 mmol/L), you have very low blood sugar (severe hypoglycemia). This is an emergency. Do not wait to see if the symptoms will go away. Get medical help right  away. Call your local emergency services (911 in the [place] If you have very low blood sugar and you cannot eat or drink, you may need a glucagon shot (injection). A family member or friend should learn how to check your blood sugar and how to give you a glucagon shot. Ask your doctor if you need to have a glucagon shot kit at home. Follow these instructions at home: Medicine  Take your insulin and diabetes medicines as told.  If your doctor says you should take more or less insulin or medicines, do this exactly as told.  Do not run out of insulin or  medicines. Food   Make healthy food choices. These include: ? Chicken, fish, egg whites, and beans. ? Oats, whole wheat, bulgur, brown rice, quinoa, and millet. ? Fresh fruits and vegetables. ? Low-fat dairy products. ? Nuts, avocado, olive oil, and canola oil.  Meet with a food specialist (dietitian). He or she can help you make an eating plan that is right for you.  Follow instructions from your doctor about what you cannot eat or drink.  Drink enough fluid to keep your pee (urine) pale yellow.  Eat healthy snacks between healthy meals.  Keep track of carbs that you eat. Do this by reading food labels and learning food serving sizes.  Follow your sick day plan when you cannot eat or drink normally. Make this plan with your doctor so it is ready to use. Activity  Exercise for 30 or more minutes a day, or as much as your doctor recommends.  Talk with your doctor before you start a new exercise or activity. Your doctor may need to tell you to change: ? How much insulin or medicines you take. ? How much food you eat. Lifestyle  Do not drink alcohol.  Do not use any tobacco products. These include cigarettes, chewing tobacco, and e-cigarettes. If you need help quitting, ask your doctor.  Learn how to deal with stress. If you need help with this, ask your doctor. Body care  Stay up to date with your shots (immunizations).  Brush your teeth and gums two times a day. Floss one or more times a day.  Go to the dentist one or more times every 6 months.  Stay at a healthy weight while you are pregnant. General instructions  Take over-the-counter and prescription medicines only as told by your doctor.  Ask your doctor about risks of high blood pressure in pregnancy (preeclampsia and eclampsia).  Share your diabetes care plan with: ? Your work or school. ? People you live with.  Check your pee for ketones: ? When you are sick. ? As told by your doctor.  Carry a card or  wear jewelry that says you have diabetes.  Keep all follow-up visits as told by your doctor. This is important. Care after giving birth  Have your blood sugar checked 4-12 weeks after you give birth.  Get checked for diabetes one or more times during 3 years. Questions to ask your doctor  Do I need to meet with a diabetes educator?  Where can I find a support group for people with gestational diabetes? Where to find more information To learn more about diabetes, visit:  American Diabetes Association: www.diabetes.org  Centers for Disease Control and Prevention (CDC): www.cdc.gov Summary  Check your blood sugar (glucose) every day while you are pregnant. Check it as often as told.  Take your insulin and diabetes medicines as told.  Keep all follow-up visits as   told by your doctor. This is important.  Have your blood sugar checked 4-12 weeks after you give birth. This information is not intended to replace advice given to you by your health care provider. Make sure you discuss any questions you have with your health care provider. Document Revised: 02/07/2018 Document Reviewed: 09/20/2015 Elsevier Patient Education  [place] of Pregnancy  The third trimester is from week 28 through week 40 (months 7 through 9). This trimester is when your unborn baby (fetus) is growing very fast. At the end of the ninth month, the unborn baby is about 20 inches in length. It weighs about 6-10 pounds. Follow these instructions at home: Medicines  Take over-the-counter and prescription medicines only as told by your doctor. Some medicines are safe and some medicines are not safe during pregnancy.  Take a prenatal vitamin that contains at least 600 micrograms (mcg) of folic acid.  If you have trouble pooping (constipation), take medicine that will make your stool soft (stool softener) if your doctor approves. Eating and drinking   Eat regular, healthy meals.  Avoid  raw meat and uncooked cheese.  If you get low calcium from the food you eat, talk to your doctor about taking a daily calcium supplement.  Eat four or five small meals rather than three large meals a day.  Avoid foods that are high in fat and sugars, such as fried and sweet foods.  To prevent constipation: ? Eat foods that are high in fiber, like fresh fruits and vegetables, whole grains, and beans. ? Drink enough fluids to keep your pee (urine) clear or pale yellow. Activity  Exercise only as told by your doctor. Stop exercising if you start to have cramps.  Avoid heavy lifting, wear low heels, and sit up straight.  Do not exercise if it is too hot, too humid, or if you are in a place of great height (high altitude).  You may continue to have sex unless your doctor tells you not to. Relieving pain and discomfort  Wear a good support bra if your breasts are tender.  Take frequent breaks and rest with your legs raised if you have leg cramps or low back pain.  Take warm water baths (sitz baths) to soothe pain or discomfort caused by hemorrhoids. Use hemorrhoid cream if your doctor approves.  If you develop puffy, bulging veins (varicose veins) in your legs: ? Wear support hose or compression stockings as told by your doctor. ? Raise (elevate) your feet for 15 minutes, 3-4 times a day. ? Limit salt in your food. Safety  Wear your seat belt when driving.  Make a list of emergency phone numbers, including numbers for family, friends, the hospital, and police and fire departments. Preparing for your baby's arrival To prepare for the arrival of your baby:  Take prenatal classes.  Practice driving to the hospital.  Visit the hospital and tour the maternity area.  Talk to your work about taking leave once the baby comes.  Pack your hospital bag.  Prepare the baby's room.  Go to your doctor visits.  Buy a rear-facing car seat. Learn how to install it in your car. General  instructions  Do not use hot tubs, steam rooms, or saunas.  Do not use any products that contain nicotine or tobacco, such as cigarettes and e-cigarettes. If you need help quitting, ask your doctor.  Do not drink alcohol.  Do not douche or use tampons or scented sanitary pads.  Do not cross your legs for long periods of time.  Do not travel for long distances unless you must. Only do so if your doctor says it is okay.  Visit your dentist if you have not gone during your pregnancy. Use a soft toothbrush to brush your teeth. Be gentle when you floss.  Avoid cat litter boxes and soil used by cats. These carry germs that can cause birth defects in the baby and can cause a loss of your baby (miscarriage) or stillbirth.  Keep all your prenatal visits as told by your doctor. This is important. Contact a doctor if:  You are not sure if you are in labor or if your water has broken.  You are dizzy.  You have mild cramps or pressure in your lower belly.  You have a nagging pain in your belly area.  You continue to feel sick to your stomach, you throw up, or you have watery poop.  You have bad smelling fluid coming from your vagina.  You have pain when you pee. Get help right away if:  You have a fever.  You are leaking fluid from your vagina.  You are spotting or bleeding from your vagina.  You have severe belly cramps or pain.  You lose or gain weight quickly.  You have trouble catching your breath and have chest pain.  You notice sudden or extreme puffiness (swelling) of your face, hands, ankles, feet, or legs.  You have not felt the baby move in over an hour.  You have severe headaches that do not go away with medicine.  You have trouble seeing.  You are leaking, or you are having a gush of fluid, from your vagina before you are 37 weeks.  You have regular belly spasms (contractions) before you are 37 weeks. Summary  The third trimester is from week 28 through week 40 (months  7 through 9). This time is when your unborn baby is growing very fast.  Follow your doctor's advice about medicine, food, and activity.  Get ready for the arrival of your baby by taking prenatal classes, getting all the baby items ready, preparing the baby's room, and visiting your doctor to be checked.  Get help right away if you are bleeding from your vagina, or you have chest pain and trouble catching your breath, or if you have not felt your baby move in over an hour. This information is not intended to replace advice given to you by your health care provider. Make sure you discuss any questions you have with your health care provider. Document Revised: 12/08/2018 Document Reviewed: 09/22/2016 Elsevier Patient Education  2020 [employer] Inc[employer]  

## 2020-03-22 NOTE — Progress Notes (Signed)
OBSTETRICS PRENATAL VIRTUAL VISIT ENCOUNTER NOTE  Provider location: [facility]   I connected with Stacy Warner on 03/22/20 at  9:00 AM EDT by MyChart Video Encounter at home and verified that I am speaking with the correct person using two identifiers.   I discussed the limitations, risks, security and privacy concerns of performing an evaluation and management service virtually and the availability of in person appointments. I also discussed with the patient that there may be a patient responsible charge related to this service. The patient expressed understanding and agreed to proceed. Subjective:  Stacy Warner is a 24 y.o. G2P0010 at [redacted]w[redacted]d being seen today for ongoing prenatal care.  She is currently monitored for the following issues for this high-risk pregnancy and has Iron deficiency anemia; Thrombocytosis ([place]); Tobacco abuse; Marijuana abuse; Supervision of high-risk pregnancy; Asymptomatic bacteriuria during pregnancy in first trimester; Asthma; Pyelonephritis affecting pregnancy; Elevated BP without diagnosis of hypertension; and Gestational diabetes mellitus (GDM) in third trimester on their problem list.  Patient reports no complaints.   .  .   . Denies any leaking of fluid.   The following portions of the patient's history were reviewed and updated as appropriate: allergies, current medications, past family history, past medical history, past social history, past surgical history and problem list.   Objective:  There were no vitals filed for this visit.  Fetal Status:           General:  Alert, oriented and cooperative. Patient is in no acute distress.  Respiratory: Normal respiratory effort, no problems with respiration noted  Mental Status: Normal mood and affect. Normal behavior. Normal judgment and thought content.  Rest of physical exam deferred due to type of encounter  Imaging: [place] MFM OB FOLLOW UP  Result Date:  03/05/2020 ----------------------------------------------------------------------  OBSTETRICS REPORT                       (Signed Final 03/05/2020 10:49 am) ---------------------------------------------------------------------- Patient Info  ID #:       [id number]                          D.O.B.:  04/26/1996 (24 yrs)  Name:       Stacy Warner                 Visit Date: 03/05/2020 09:53 am ---------------------------------------------------------------------- Performed By  Attending:        Sander Nephew      Ref. Address:     [place], [place]  [place]  Performed By:     Rodrigo Ran BS      [facility]                                 [facility] Care  Referred By:      Shelly Bombard MD ---------------------------------------------------------------------- Orders  #  Description                           Code        Ordered By  1  [place] MFM OB FOLLOW UP                   76816.01    RAVI SHANKAR  2  [place] MFM UA CORD DOPPLER                16109.60    [place] ----------------------------------------------------------------------  #  Order #                     Accession #                Episode #  1  [id number]                   [id number]                 [id number]  2  [id number]                   [id number]                 [id number] ---------------------------------------------------------------------- Indications  Encounter for other antenatal screening        Z36.2  follow-up  Fetal abnormality - other known or             O35.9XX0  suspected (EIF)  Marijuana Use  Elevated blood pressure affecting pregnancy    O13.3  in third trimester  LR NIPS  Asthma                                         O99.89 j45.909  Maternal  care for known or suspected poor      O36.5930  fetal growth, third trimester, not applicable or  unspecified IUGR  [redacted] weeks gestation of pregnancy                Z3A.28  Gestational diabetes in pregnancy, diet        O24.410  controlled ---------------------------------------------------------------------- Fetal Evaluation  Num Of Fetuses:         1  Fetal Heart Rate(bpm):  144  Cardiac Activity:       Observed  Presentation:           Cephalic  Placenta:               Posterior  P. Cord Insertion:      Visualized  Amniotic Fluid  AFI FV:      Within normal limits  AFI Sum(cm)     %  Tile       Largest Pocket(cm)  15.4            55          4.9  RUQ(cm)       RLQ(cm)       LUQ(cm)        LLQ(cm)  3.9           3.4           3.2            4.9 ---------------------------------------------------------------------- Biometry  BPD:      70.4  mm     G. Age:  28w 2d         24  %    CI:        72.21   %    70 - 86                                                          FL/HC:      19.5   %    19.6 - 20.8  HC:      263.6  mm     G. Age:  28w 5d         18  %    HC/AC:      1.13        0.99 - 1.21  AC:      232.4  mm     G. Age:  27w 4d         14  %    FL/BPD:     72.9   %    71 - 87  FL:       51.3  mm     G. Age:  27w 3d          8  %    FL/AC:      22.1   %    20 - 24  HUM:      45.8  mm     G. Age:  27w 0d          9  %  Est. FW:    1111  gm      2 lb 7 oz     10  % ---------------------------------------------------------------------- OB History  Gravidity:    2  TOP:          1        Living:  0 ---------------------------------------------------------------------- Gestational Age  LMP:           28w 5d        Date:  08/17/19                 EDD:   05/23/20  U/S Today:     28w 0d                                        EDD:   05/28/20  Best:          28w 5d     Det. By:  LMP  (08/17/19)          EDD:   05/23/20 ---------------------------------------------------------------------- Anatomy  Cranium:  Appears normal         LVOT:                   Appears normal  Cavum:                 Appears normal         Aortic Arch:            Previously seen  Ventricles:            Appears normal         Ductal Arch:            Previously seen  Choroid Plexus:        Appears normal         Diaphragm:              Appears normal  Cerebellum:            Appears normal         Stomach:                Appears normal, left                                                                        sided  Posterior Fossa:       Previously seen        Abdomen:                Appears normal  Nuchal Fold:           Not applicable (>95    Abdominal Wall:         Previously seen                         wks GA)  Face:                  Appears normal         Cord Vessels:           Previously seen                         (orbits and profile)  Lips:                  Appears normal         Kidneys:                Appear normal  Palate:                Not well visualized    Bladder:                Appears normal  Thoracic:              Appears normal         Spine:                  Previously seen  Heart:                 Echogenic focus        Upper Extremities:      Previously seen  in LV  RVOT:                  Appears normal         Lower Extremities:      Previously seen  Other:  Female gender previously seen. 3VV and 3VTV previously visualized.          Nasal bone previously visualized. Open hands, Heels and 5th digit          previously visualized. ---------------------------------------------------------------------- Doppler - Fetal Vessels  Umbilical Artery   S/D     %tile      RI    %tile                             ADFV    RDFV   3.53       78    0.77       94                                No      No ---------------------------------------------------------------------- Cervix Uterus Adnexa  Cervix  Not visualized (advanced GA >24wks)  Uterus  No abnormality visualized.  Right Ovary  Within normal limits.  Left  Ovary  Not visualized.  Cul De Sac  No free fluid seen.  Adnexa  No abnormality visualized. ---------------------------------------------------------------------- Impression  Follow up growth and amniotic fluid due to IUGR  Normal interval growth consistent with IUGR EFW 10th%  There is good fetal movement and amniotic fluid. The UA  Dopplers are normal ---------------------------------------------------------------------- Recommendations  Continue weekly testing with UA Dopplers  Repeat growth in 3-4 weeks. ----------------------------------------------------------------------               Sander Nephew, MD Electronically Signed Final Report   03/05/2020 10:49 am ----------------------------------------------------------------------  [place] MFM UA CORD DOPPLER  Result Date: 03/20/2020 ----------------------------------------------------------------------  OBSTETRICS REPORT                        (Signed Final 03/20/2020 11:36 am) ---------------------------------------------------------------------- Patient Info  ID #:       [id number]                          D.O.B.:  [birth year] (24 yrs)  Name:       Stacy Warner                 Visit Date: 03/19/2020 03:49 pm ---------------------------------------------------------------------- Performed By  Attending:        Sander Nephew      Ref. Address:      [place], [place]  [place], [place]  Performed By:     Rodrigo Ran BS      Location[facility]                    RD[facility]                                  Fetal Care at                                                              [facility]                                                              [facility]  Referred By:      Shelly Bombard MD  ---------------------------------------------------------------------- Orders  #  Description                           Code        Ordered By  1  [place] MFM UA CORD DOPPLER                76820.02    Sander Nephew ----------------------------------------------------------------------  #  Order #                     Accession #                Episode #  1  [id number]                   [id number]                 [id number] ---------------------------------------------------------------------- Indications  Encounter for other antenatal screening         Z36.2  follow-up  Maternal care for known or suspected poor       O36.5930  fetal growth, third trimester, not applicable or  unspecified IUGR  Gestational diabetes in pregnancy, diet         O24.410  controlled  Elevated blood pressure affecting pregnancy     O13.3  in third trimester  Fetal abnormality - other known or  O35.9XX0  suspected (EIF)  Asthma                                          O99.89 j45.909  [redacted] weeks gestation of pregnancy                 Z3A.30 ---------------------------------------------------------------------- Fetal Evaluation  Num Of Fetuses:          1  Fetal Heart Rate(bpm):   144  Cardiac Activity:        Observed  Presentation:            Cephalic  Amniotic Fluid  AFI FV:      Within normal limits  AFI Sum(cm)     %Tile       Largest Pocket(cm)  16.9            62          5.6  RUQ(cm)       RLQ(cm)       LUQ(cm)        LLQ(cm)  5             5.6           2.3            4 ---------------------------------------------------------------------- OB History  Gravidity:    2  TOP:          1        Living:  0 ---------------------------------------------------------------------- Gestational Age  LMP:           30w 5d        Date:  08/17/19                 EDD:   05/23/20  Best:          30w 5d     Det. By:  LMP  (08/17/19)          EDD:   05/23/20  ---------------------------------------------------------------------- Doppler - Fetal Vessels  Umbilical Artery    S/D    %tile                                              ADFV    RDFV   4.15       96                                                 No      No ---------------------------------------------------------------------- Impression  Follow up UA Dopplers due to IUGR  UA dopplers are mildly elevated at 96th%.  There is good fetal movement and amniotic fluid today.  Her blood pressure is mildly elevated at 126/93 with a  normal repeat. She is asymptomatic ---------------------------------------------------------------------- Recommendations  Continue weekly testing with UA dopplers.  Repeat growth in 2 weeks. ----------------------------------------------------------------------               Sander Nephew, MD Electronically Signed Final Report   03/20/2020 11:36 am ----------------------------------------------------------------------  [place] MFM UA CORD DOPPLER  Result Date: 03/12/2020 ----------------------------------------------------------------------  OBSTETRICS REPORT                       (  Signed Final 03/12/2020 10:16 am) ---------------------------------------------------------------------- Patient Info  ID #:       [id number]                          D.O.B.:  Oct 02, 1995 (24 yrs)  Name:       Stacy Warner                 Visit Date: 03/12/2020 10:00 am ---------------------------------------------------------------------- Performed By  Attending:        Tama High MD        Ref. Address:     [address] [place]                                                             [place], [place]  Performed By:     Corky Crafts             Location[facility]                    RDMS,RVT                                 Fetal Care at                                                              [place] for                                                             Women  Referred By:      Shelly Bombard MD ---------------------------------------------------------------------- Orders  #  Description                           Code        Ordered By  1  [place] MFM UA CORD DOPPLER  78588.50    [place] ----------------------------------------------------------------------  #  Order #                     Accession #                Episode #  1  [id number]                   [id number]                 [id number] ---------------------------------------------------------------------- Indications  Maternal care for known or suspected poor      O36.5930  fetal growth, third trimester, not applicable or  unspecified IUGR  Gestational diabetes in pregnancy, diet        O24.410  controlled  Elevated blood pressure affecting pregnancy    O13.3  in third trimester  [redacted] weeks gestation of pregnancy                Z3A.29  Fetal abnormality - other known or             O35.9XX0  suspected (EIF)  Marijuana Use  LR NIPS  Asthma                                         O99.89 j45.909 ---------------------------------------------------------------------- Fetal Evaluation  Num Of Fetuses:         1  Fetal Heart Rate(bpm):  158  Cardiac Activity:       Observed  Presentation:           Cephalic  Placenta:               Posterior  P. Cord Insertion:      Previously Visualized  Amniotic Fluid  AFI FV:      Within normal limits  AFI Sum(cm)     %Tile       Largest Pocket(cm)  12.7            35          4.6  RUQ(cm)       RLQ(cm)       LUQ(cm)        LLQ(cm)  3             1.1           4              4.6 ---------------------------------------------------------------------- OB History  Gravidity:    2  TOP:          1        Living:  0 ---------------------------------------------------------------------- Gestational Age  LMP:           29w 5d        Date:   08/17/19                 EDD:   05/23/20  Best:          29w 5d     Det. By:  LMP  (08/17/19)          EDD:   05/23/20 ---------------------------------------------------------------------- Doppler - Fetal Vessels  Umbilical Artery   S/D     %tile  ADFV    RDFV   3.46       79                                                 No      No ---------------------------------------------------------------------- Cervix Uterus Adnexa  Cervix  Not visualized (advanced GA >24wks) ---------------------------------------------------------------------- Impression  Fetal growth restriction.  Patient return for umbilical artery  Doppler study.  Amniotic fluid is normal and good fetal activity seen.  Umbilical artery Doppler showed normal forward diastolic flow.  We reassured the patient of the findings.  This study was remotely read. ---------------------------------------------------------------------- Recommendations  Follow-up Doppler next week. ----------------------------------------------------------------------                  Tama High, MD Electronically Signed Final Report   03/12/2020 10:16 am ----------------------------------------------------------------------  [place] MFM UA CORD DOPPLER  Result Date: 03/05/2020 ----------------------------------------------------------------------  OBSTETRICS REPORT                       (Signed Final 03/05/2020 10:49 am) ---------------------------------------------------------------------- Patient Info  ID #:       [id number]                          D.O.B.:  [birth year] (24 yrs)  Name:       Stacy Warner                 Visit Date: 03/05/2020 09:53 am ---------------------------------------------------------------------- Performed By  Attending:        Sander Nephew      Ref. Address:     [place], [place]                                                              [place], [place]  Performed By:     Rodrigo Ran BS      [facility]                    RD[facility] RVT                                 Fetal  Care  Referred By:      Shelly Bombard MD ---------------------------------------------------------------------- Orders  #  Description                           Code        Ordered By  1  [place] MFM OB FOLLOW UP                   62836.62    RAVI SHANKAR  2  [place] MFM UA CORD DOPPLER                94765.46    RAVI [facility] ----------------------------------------------------------------------  #  Order #                     Accession #                Episode #  1  [id number]                   [id number]                 [id number]  2  [id number]                   [id number]                 [id number] ---------------------------------------------------------------------- Indications  Encounter for other antenatal screening        Z36.2  follow-up  Fetal abnormality - other known or             O35.9XX0  suspected (EIF)  Marijuana Use  Elevated blood pressure affecting pregnancy    O13.3  in third trimester  LR NIPS  Asthma                                         O99.89 j45.909  Maternal care for known or suspected poor      O36.5930  fetal growth, third trimester, not applicable or  unspecified IUGR  [redacted] weeks gestation of pregnancy                Z3A.28  Gestational diabetes in pregnancy, diet        O24.410  controlled ---------------------------------------------------------------------- Fetal Evaluation  Num Of Fetuses:         1  Fetal Heart Rate(bpm):  144  Cardiac Activity:       Observed  Presentation:           Cephalic  Placenta:               Posterior  P. Cord Insertion:      Visualized  Amniotic Fluid  AFI FV:      Within normal limits  AFI Sum(cm)     %Tile       Largest Pocket(cm)  15.4            55          4.9  RUQ(cm)       RLQ(cm)        LUQ(cm)        LLQ(cm)  3.9           3.4  3.2            4.9 ---------------------------------------------------------------------- Biometry  BPD:      70.4  mm     G. Age:  28w 2d         24  %    CI:        72.21   %    70 - 86                                                          FL/HC:      19.5   %    19.6 - 20.8  HC:      263.6  mm     G. Age:  28w 5d         18  %    HC/AC:      1.13        0.99 - 1.21  AC:      232.4  mm     G. Age:  27w 4d         14  %    FL/BPD:     72.9   %    71 - 87  FL:       51.3  mm     G. Age:  27w 3d          8  %    FL/AC:      22.1   %    20 - 24  HUM:      45.8  mm     G. Age:  27w 0d          9  %  Est. FW:    1111  gm      2 lb 7 oz     10  % ---------------------------------------------------------------------- OB History  Gravidity:    2  TOP:          1        Living:  0 ---------------------------------------------------------------------- Gestational Age  LMP:           28w 5d        Date:  08/17/19                 EDD:   05/23/20  U/S Today:     28w 0d                                        EDD:   05/28/20  Best:          28w 5d     Det. By:  LMP  (08/17/19)          EDD:   05/23/20 ---------------------------------------------------------------------- Anatomy  Cranium:               Appears normal         LVOT:                   Appears normal  Cavum:                 Appears normal         Aortic Arch:  Previously seen  Ventricles:            Appears normal         Ductal Arch:            Previously seen  Choroid Plexus:        Appears normal         Diaphragm:              Appears normal  Cerebellum:            Appears normal         Stomach:                Appears normal, left                                                                        sided  Posterior Fossa:       Previously seen        Abdomen:                Appears normal  Nuchal Fold:           Not applicable (>54    Abdominal Wall:         Previously seen                          wks GA)  Face:                  Appears normal         Cord Vessels:           Previously seen                         (orbits and profile)  Lips:                  Appears normal         Kidneys:                Appear normal  Palate:                Not well visualized    Bladder:                Appears normal  Thoracic:              Appears normal         Spine:                  Previously seen  Heart:                 Echogenic focus        Upper Extremities:      Previously seen                         in LV  RVOT:                  Appears normal         Lower Extremities:      Previously seen  Other:  Female gender previously seen. 3VV and 3VTV previously visualized.  Nasal bone previously visualized. Open hands, Heels and 5th digit          previously visualized. ---------------------------------------------------------------------- Doppler - Fetal Vessels  Umbilical Artery   S/D     %tile      RI    %tile                             ADFV    RDFV   3.53       78    0.77       94                                No      No ---------------------------------------------------------------------- Cervix Uterus Adnexa  Cervix  Not visualized (advanced GA >24wks)  Uterus  No abnormality visualized.  Right Ovary  Within normal limits.  Left Ovary  Not visualized.  Cul De Sac  No free fluid seen.  Adnexa  No abnormality visualized. ---------------------------------------------------------------------- Impression  Follow up growth and amniotic fluid due to IUGR  Normal interval growth consistent with IUGR EFW 10th%  There is good fetal movement and amniotic fluid. The UA  Dopplers are normal ---------------------------------------------------------------------- Recommendations  Continue weekly testing with UA Dopplers  Repeat growth in 3-4 weeks. ----------------------------------------------------------------------               Sander Nephew, MD Electronically Signed Final Report   03/05/2020 10:49 am  ----------------------------------------------------------------------  [place] MFM UA CORD DOPPLER  Result Date: 02/27/2020 ----------------------------------------------------------------------  OBSTETRICS REPORT                       (Signed Final 02/27/2020 04:17 pm) ---------------------------------------------------------------------- Patient Info  ID #:       [id number]                          D.O.B.:  [birth year] (24 yrs)  Name:       Stacy Warner                 Visit Date: 02/27/2020 03:37 pm ---------------------------------------------------------------------- Performed By  Attending:        Tama High MD        Ref. Address:     [address] [place]                                                             [place], [place]  Performed  By:     Wilnette Kales        [facility]                    RDMS,RVT                                 Fetal Care  Referred By:      Shelly Bombard MD ---------------------------------------------------------------------- Orders  #  Description                           Code        Ordered By  1  [place] MFM UA CORD DOPPLER                [id number]    [facility] ----------------------------------------------------------------------  #  Order #                     Accession #                Episode #  1  [id number]                   [id number]                 [id number] ---------------------------------------------------------------------- Indications  [redacted] weeks gestation of pregnancy                Z3A.27  Fetal abnormality - other known or             O35.9XX0  suspected (EIF)  Marijuana Use  Encounter for antenatal screening for          Z36.3  malformations  Elevated blood pressure affecting pregnancy    O13.2  in second trimester  LR NIPS  Asthma                                         O99.89  j45.909  Maternal care for known or suspected poor      O36.5920  fetal growth, second trimester, not applicable  or unspecified IUGR ---------------------------------------------------------------------- Fetal Evaluation  Num Of Fetuses:         1  Fetal Heart Rate(bpm):  150  Cardiac Activity:       Observed  Presentation:           Cephalic  Placenta:               Posterior  P. Cord Insertion:      Visualized, central  Amniotic Fluid  AFI FV:      Within normal limits                              Largest Pocket(cm)                              6 ---------------------------------------------------------------------- OB History  Gravidity:    2  TOP:          1        Living:  0 ---------------------------------------------------------------------- Gestational Age  LMP:           27w 5d        Date:  08/17/19                 EDD:   05/23/20  Best:          27w 5d     Det. By:  LMP  (08/17/19)          EDD:   05/23/20 ---------------------------------------------------------------------- Anatomy  Cranium:               Appears normal         Ductal Arch:            Appears normal  Ventricles:            Appears normal         Diaphragm:              Appears normal  Posterior Fossa:       Appears normal         Stomach:                Appears normal, left                                                                        sided  Nuchal Fold:           Appears normal         Abdomen:                Appears normal  Heart:                 Appears normal         Cord Vessels:           Appears normal (3                         (4CH, axis, and                                vessel cord)                         situs)  RVOT:                  Appears normal         Kidneys:                Appear normal  Aortic Arch:           Appears normal         Bladder:                Appears normal  Other:  Fetus appears to be female. ---------------------------------------------------------------------- Doppler - Fetal Vessels   Umbilical Artery   S/D     %tile      RI    %tile                             ADFV  RDFV   3.88       85    0.74       83                                No      No ---------------------------------------------------------------------- Impression  Fetal growth restriction.  Amniotic fluid is normal and good fetal activity is seen.  Umbilical artery Doppler showed normal forward diastolic flow  .  We reassured the patient of the findings. ---------------------------------------------------------------------- Recommendations  -Fetal growth assessment next week.  -UA Doppler next week. ----------------------------------------------------------------------                  Tama High, MD Electronically Signed Final Report   02/27/2020 04:17 pm ----------------------------------------------------------------------   Assessment and Plan:  Pregnancy: G2P0010 at [redacted]w[redacted]d 1. Asymptomatic bacteriuria during pregnancy in first trimester Pt stable  2. Supervision of high risk pregnancy in third trimester   3. Mild intermittent asthma without complication No exacerbations at this time  4. Thrombocytosis ([place])   5. Iron deficiency anemia, unspecified iron deficiency anemia type Anemia currently resolved, last hgb 11.8  6. Elevated BP without diagnosis of hypertension BP normal today 124/82  7. Diet controlled gestational diabetes mellitus (GDM) in third trimester Excellent blood sugars as reported FBS 87-92 Postprandials: 90-121 as reported,  All values in range Growth scan has been scheduled  Preterm labor symptoms and general obstetric precautions including but not limited to vaginal bleeding, contractions, leaking of fluid and fetal movement were reviewed in detail with the patient. I discussed the assessment and treatment plan with the patient. The patient was provided an opportunity to ask questions and all were answered. The patient agreed with the plan and demonstrated an understanding of the  instructions. The patient was advised to call back or seek an in-person office evaluation/go to MAU at [facility] for any urgent or concerning symptoms. Please refer to After Visit Summary for other counseling recommendations.   I provided 15 minutes of face-to-face time during this encounter.  Return in about 2 weeks (around 04/05/2020) for [facility], in person.  Future Appointments  Date Time Provider [place]  03/26/2020  3:30 PM [facility] NURSE [facility]  03/26/2020  3:45 PM [facility] US5 [facility]  04/02/2020  2:30 PM [facility] NURSE [facility]  04/02/2020  2:45 PM [facility] US5 [facility] [place], MD Center for [employer], [place]

## 2020-03-25 ENCOUNTER — Inpatient Hospital Stay (HOSPITAL_COMMUNITY)
Admission: AD | Admit: 2020-03-25 | Discharge: 2020-03-25 | Disposition: A | Discharge status: Home / Self Care | Payer: Medicaid Other | Attending: Obstetrics and Gynecology | Admitting: Obstetrics and Gynecology

## 2020-03-25 ENCOUNTER — Other Ambulatory Visit: Payer: Self-pay

## 2020-03-25 ENCOUNTER — Encounter (HOSPITAL_COMMUNITY): Payer: Self-pay | Admitting: Obstetrics and Gynecology

## 2020-03-25 DIAGNOSIS — K219 Gastro-esophageal reflux disease without esophagitis: Secondary | ICD-10-CM | POA: Insufficient documentation

## 2020-03-25 DIAGNOSIS — O24419 Gestational diabetes mellitus in pregnancy, unspecified control: Secondary | ICD-10-CM | POA: Diagnosis not present

## 2020-03-25 DIAGNOSIS — J45909 Unspecified asthma, uncomplicated: Secondary | ICD-10-CM | POA: Diagnosis not present

## 2020-03-25 DIAGNOSIS — Z3A31 31 weeks gestation of pregnancy: Secondary | ICD-10-CM | POA: Diagnosis not present

## 2020-03-25 DIAGNOSIS — O99613 Diseases of the digestive system complicating pregnancy, third trimester: Secondary | ICD-10-CM | POA: Insufficient documentation

## 2020-03-25 DIAGNOSIS — O99513 Diseases of the respiratory system complicating pregnancy, third trimester: Secondary | ICD-10-CM | POA: Insufficient documentation

## 2020-03-25 DIAGNOSIS — Z3689 Encounter for other specified antenatal screening: Secondary | ICD-10-CM

## 2020-03-25 DIAGNOSIS — Z79899 Other long term (current) drug therapy: Secondary | ICD-10-CM | POA: Diagnosis not present

## 2020-03-25 DIAGNOSIS — O212 Late vomiting of pregnancy: Secondary | ICD-10-CM | POA: Diagnosis not present

## 2020-03-25 DIAGNOSIS — Z87891 Personal history of nicotine dependence: Secondary | ICD-10-CM | POA: Diagnosis not present

## 2020-03-25 DIAGNOSIS — O0993 Supervision of high risk pregnancy, unspecified, third trimester: Secondary | ICD-10-CM | POA: Diagnosis not present

## 2020-03-25 DIAGNOSIS — O99891 Other specified diseases and conditions complicating pregnancy: Secondary | ICD-10-CM

## 2020-03-25 LAB — CBC
HCT: 34.7 % — ABNORMAL LOW (ref 36.0–46.0)
Hemoglobin: 11.2 g/dL — ABNORMAL LOW (ref 12.0–15.0)
MCH: 28.8 pg (ref 26.0–34.0)
MCHC: 32.3 g/dL (ref 30.0–36.0)
MCV: 89.2 fL (ref 80.0–100.0)
Platelets: 358 10*3/uL (ref 150–400)
RBC: 3.89 MIL/uL (ref 3.87–5.11)
RDW: 14 % (ref 11.5–15.5)
WBC: 11.1 10*3/uL — ABNORMAL HIGH (ref 4.0–10.5)
nRBC: 0 % (ref 0.0–0.2)

## 2020-03-25 LAB — COMPREHENSIVE METABOLIC PANEL
ALT: 13 U/L (ref 0–44)
AST: 21 U/L (ref 15–41)
Albumin: 2.5 g/dL — ABNORMAL LOW (ref 3.5–5.0)
Alkaline Phosphatase: 68 U/L (ref 38–126)
Anion gap: 10 (ref 5–15)
BUN: <5 mg/dL — ABNORMAL LOW (ref 6–20)
CO2: 19 mmol/L — ABNORMAL LOW (ref 22–32)
Calcium: 9.1 mg/dL (ref 8.9–10.3)
Chloride: 106 mmol/L (ref 98–111)
Creatinine, Ser: 0.74 mg/dL (ref 0.44–1.00)
GFR calc Af Amer: >60 mL/min (ref >60)
GFR calc non Af Amer: >60 mL/min (ref >60)
Glucose, Bld: 114 mg/dL — ABNORMAL HIGH (ref 70–99)
Potassium: 3.6 mmol/L (ref 3.5–5.1)
Sodium: 135 mmol/L (ref 135–145)
Total Bilirubin: 0.5 mg/dL (ref 0.3–1.2)
Total Protein: 5.5 g/dL — ABNORMAL LOW (ref 6.5–8.1)

## 2020-03-25 LAB — URINALYSIS, ROUTINE W REFLEX MICROSCOPIC
Bacteria, UA: NONE SEEN
Bilirubin Urine: NEGATIVE
Glucose, UA: NEGATIVE mg/dL
Hgb urine dipstick: NEGATIVE
Ketones, ur: NEGATIVE mg/dL
Leukocytes,Ua: NEGATIVE
Nitrite: NEGATIVE
Protein, ur: 30 mg/dL — AB
Specific Gravity, Urine: 1.021 (ref 1.005–1.030)
pH: 8 (ref 5.0–8.0)

## 2020-03-25 MED ORDER — LACTATED RINGERS IV BOLUS
1000.0000 mL | Freq: Once | INTRAVENOUS | Status: AC
  Administered 2020-03-25: 1000 mL via INTRAVENOUS

## 2020-03-25 MED ORDER — PROMETHAZINE HCL 25 MG/ML IJ SOLN
25.0000 mg | Freq: Once | INTRAMUSCULAR | Status: AC
  Administered 2020-03-25: 25 mg via INTRAVENOUS
  Filled 2020-03-25: qty 1

## 2020-03-25 MED ORDER — FAMOTIDINE IN NACL 20-0.9 MG/50ML-% IV SOLN
20.0000 mg | Freq: Once | INTRAVENOUS | Status: AC
  Administered 2020-03-25: 20 mg via INTRAVENOUS
  Filled 2020-03-25: qty 50

## 2020-03-25 MED ORDER — FAMOTIDINE 20 MG PO TABS: 20.0000 mg | ORAL_TABLET | Freq: Two times a day (BID) | ORAL | 1 refills | Status: DC

## 2020-03-25 NOTE — MAU Note (Signed)
Been throwing up since 0200.  Can't keep anything down. Was unable to sleep.  Nurse told her to come in .  She is on an antibiotic for tooth infection.

## 2020-03-25 NOTE — MAU Provider Note (Signed)
History     Patient Active Problem List   Diagnosis Date Noted  . Gestational diabetes mellitus (GDM) in third trimester 03/05/2020  . Elevated BP without diagnosis of hypertension 01/25/2020  . Pyelonephritis affecting pregnancy 01/14/2020  . Asymptomatic bacteriuria during pregnancy in first trimester 11/09/2019  . Asthma   . Supervision of high-risk pregnancy 10/30/2019  . Tobacco abuse 04/23/2016  . Marijuana abuse 04/23/2016  . Iron deficiency anemia 04/18/2016    Class: Acute  . Thrombocytosis ([place]) 04/18/2016    Class: Acute    Stacy Warner is a 24 y.o. G2P0010 at 74w4dpresenting today with vomiting since 2am. She had a heavy meal for dinner last night and has a h/o acid reflux during this pregnancy. She denies VB, LOF, cramping or contractions, and has had no diarrhea.   Emesis  This is a recurrent problem. The current episode started today. The problem occurs 2 to 4 times per day. The problem has been unchanged. The emesis has an appearance of stomach contents. There has been no fever. Pertinent negatives include no abdominal pain, diarrhea, dizziness or headaches. She has tried nothing for the symptoms. The treatment provided no relief.    OB History    Gravida  2   Para      Term      Preterm      AB  1   Living        SAB      TAB  1   Ectopic      Multiple      Live Births              Past Medical History:  Diagnosis Date  . Anemia   . Asthma   . Chlamydia    age 24 . Infection    UTI    Past Surgical History:  Procedure Laterality Date  . TONSILLECTOMY      Family History  Problem Relation Age of Onset  . Heart disease Paternal Grandmother   . Stroke Paternal Grandmother   . Thyroid disease Paternal Grandfather     Social History   Tobacco Use  . Smoking status: Former Smoker    Packs/day: 0.25    Years: 1.00    Pack years: 0.25    Types: Cigars  . Smokeless tobacco: Never Used  . Tobacco comment: black and mild   Vaping Use  . Vaping Use: Never used  Substance Use Topics  . Alcohol use: Not Currently    Comment: occasional, not since confirmed pregnancy  . Drug use: Not Currently    Types: Marijuana    Comment: last used 2 days ago    Allergies: No Known Allergies  Medications Prior to Admission  Medication Sig Dispense Refill Last Dose  . Accu-Chek Softclix Lancets lancets Use as instructed 100 each 12 03/24/2020 at Unknown time  . acetaminophen (TYLENOL) 500 MG tablet Take 500 mg by mouth every 6 (six) hours as needed for mild pain, moderate pain or headache.   03/24/2020 at Unknown time  . Blood Glucose Monitoring Suppl (ACCU-CHEK GUIDE) w/Device KIT 1 kit by Does not apply route daily. 1 kit 0 Past Week at Unknown time  . Blood Pressure Monitor KIT 1 each by Does not apply route daily. 1 kit 0 03/24/2020 at Unknown time  . Elastic Bandages & Supports (COMFORT FIT MATERNITY SUPP SM) MISC Wear as directed. 1 each 0 Past Month at Unknown time  . glucose blood (ACCU-CHEK GUIDE)  test strip Use as instructed 100 each 12 03/24/2020 at Unknown time  . Prenatal Vit-Fe Phos-FA-Omega (VITAFOL GUMMIES) 3.33-0.333-34.8 MG CHEW Chew 3 each by mouth daily. 90 tablet 11 03/24/2020 at Unknown time  . amoxicillin (AMOXIL) 500 MG tablet Take 500 mg by mouth every 8 (eight) hours.     Marland Kitchen labetalol (NORMODYNE) 200 MG tablet Take 200 mg by mouth 2 (two) times daily.     Marland Kitchen loratadine (CLARITIN) 10 MG tablet Take 1 tablet (10 mg total) by mouth daily. (Patient not taking: Reported on 03/05/2020) 30 tablet 11 Unknown at Unknown time  . metoCLOPramide (REGLAN) 5 MG tablet Take 5 mg by mouth 3 (three) times daily.     . ondansetron (ZOFRAN) 4 MG tablet Take 1 tablet (4 mg total) by mouth every 8 (eight) hours as needed for nausea or vomiting. (Patient not taking: Reported on 03/12/2020) 20 tablet 2 Unknown at Unknown time    Review of Systems  Gastrointestinal: Positive for heartburn, nausea and vomiting. Negative for  abdominal pain, constipation and diarrhea.  Neurological: Negative for dizziness and headaches.  All other systems reviewed and are negative.   See HPI Above Physical Exam   Blood pressure (!) 123/87, pulse 92, temperature 98.5 F (36.9 C), temperature source Oral, resp. rate 18, height _0  (1.651 m), weight 151 lb 3.2 oz (68.6 kg), last menstrual period 08/17/2019, SpO2 99 %.  Results for orders placed or performed during the hospital encounter of 03/25/20 (from the past 24 hour(s))  Urinalysis, Routine w reflex microscopic     Status: Abnormal   Collection Time: 03/25/20  9:03 AM  Result Value Ref Range   Color, Urine YELLOW YELLOW   APPearance CLEAR CLEAR   Specific Gravity, Urine 1.021 1.005 - 1.030   pH 8.0 5.0 - 8.0   Glucose, UA NEGATIVE NEGATIVE mg/dL   Hgb urine dipstick NEGATIVE NEGATIVE   Bilirubin Urine NEGATIVE NEGATIVE   Ketones, ur NEGATIVE NEGATIVE mg/dL   Protein, ur 30 (A) NEGATIVE mg/dL   Nitrite NEGATIVE NEGATIVE   Leukocytes,Ua NEGATIVE NEGATIVE   WBC, UA 0-5 0 - 5 WBC/hpf   Bacteria, UA NONE SEEN NONE SEEN   Squamous Epithelial / LPF 0-5 0 - 5   Mucus PRESENT   CBC     Status: Abnormal   Collection Time: 03/25/20  9:53 AM  Result Value Ref Range   WBC 11.1 (H) 4.0 - 10.5 K/uL   RBC 3.89 3.87 - 5.11 MIL/uL   Hemoglobin 11.2 (L) 12.0 - 15.0 g/dL   HCT 34.7 (L) 36 - 46 %   MCV 89.2 80.0 - 100.0 fL   MCH 28.8 26.0 - 34.0 pg   MCHC 32.3 30.0 - 36.0 g/dL   RDW 14.0 11.5 - 15.5 %   Platelets 358 150 - 400 K/uL   nRBC 0.0 0.0 - 0.2 %  Comprehensive metabolic panel     Status: Abnormal   Collection Time: 03/25/20  9:53 AM  Result Value Ref Range   Sodium 135 135 - 145 mmol/L   Potassium 3.6 3.5 - 5.1 mmol/L   Chloride 106 98 - 111 mmol/L   CO2 19 (L) 22 - 32 mmol/L   Glucose, Bld 114 (H) 70 - 99 mg/dL   BUN <5 (L) 6 - 20 mg/dL   Creatinine, Ser 0.74 0.44 - 1.00 mg/dL   Calcium 9.1 8.9 - 10.3 mg/dL   Total Protein 5.5 (L) 6.5 - 8.1 g/dL   Albumin 2.5  (L)  3.5 - 5.0 g/dL   AST 21 15 - 41 U/L   ALT 13 0 - 44 U/L   Alkaline Phosphatase 68 38 - 126 U/L   Total Bilirubin 0.5 0.3 - 1.2 mg/dL   GFR calc non Af Amer >60 >60 mL/min   GFR calc Af Amer >60 >60 mL/min   Anion gap 10 5 - 15    Physical Exam Constitutional:      Appearance: Normal appearance. She is normal weight.  Cardiovascular:     Rate and Rhythm: Normal rate and regular rhythm.     Pulses: Normal pulses.     Heart sounds: Normal heart sounds.  Pulmonary:     Effort: Pulmonary effort is normal.     Breath sounds: Normal breath sounds.  Abdominal:     General: Bowel sounds are normal.  Genitourinary:    Comments: Vaginal/cervical exam deferred  Skin:    General: Skin is warm and dry.  Neurological:     Mental Status: She is alert and oriented to person, place, and time.  Psychiatric:        Mood and Affect: Mood normal.        Behavior: Behavior normal.        Thought Content: Thought content normal.        Judgment: Judgment normal.    Fetal Tracing: reactive w +movement Baseline: 145 Variability: moderate Accelerations: present Decelerations: none Toco: none  ED Course  Assessment: Acid reflux causing nighttime emesis [redacted] weeks gestation  Plan: - LR, phenergan, pepcid - pt verbalized relief at 1100, tolerating PO liquids - CBC - mild anemia characteristic of this stage in pregnancy, pt asymptomatic, otherwise normal - CMP - increased glucose due to pt sipping ginger ale, otherwise WNL - Prescription given for twice daily Pepcid - Discharge home in stable condition - [facility] as scheduled - Preterm labor precautions given    Gaylan Gerold, CNM, MSN, [facility] 03/25/20 11:14 AM

## 2020-03-25 NOTE — Discharge Instructions (Signed)
Heartburn Heartburn is a type of pain or discomfort that can happen in the throat or chest. It is often described as a burning pain. It may also cause a bad, acid-like taste in the mouth. Heartburn may feel worse when you lie down or bend over. It may be worse at night. It may be caused by stomach contents that move back up (reflux) into the tube that connects the mouth with the stomach (esophagus). Follow these instructions at home: Eating and drinking   Avoid certain foods and drinks as told by your doctor. This may include: ? Coffee and tea (with or without caffeine). ? Drinks that have alcohol. ? Energy drinks and sports drinks. ? Carbonated drinks or sodas. ? Chocolate and cocoa. ? Peppermint and mint flavorings. ? Garlic and onions. ? Horseradish. ? Spicy and acidic foods, such as:  Peppers.  Chili powder and curry powder.  Vinegar.  Hot sauces and BBQ sauce. ? Citrus fruit juices and citrus fruits, such as:  Oranges.  Lemons.  Limes. ? Tomato-based foods, such as:  Red sauce and pizza with red sauce.  Chili.  Salsa. ? Fried and fatty foods, such as:  Donuts.  [place] fries and potato chips.  High-fat dressings. ? High-fat meats, such as:  Hot dogs and sausage.  Rib eye steak.  Ham and bacon. ? High-fat dairy items, such as:  Whole milk.  Butter.  Cream cheese.  Eat small meals often. Avoid eating large meals.  Avoid drinking large amounts of liquid with your meals.  Avoid eating meals during the 2-3 hours before bedtime.  Avoid lying down right after you eat.  Do not exercise right after you eat. Lifestyle      If you are overweight, lose an amount of weight that is healthy for you. Ask your doctor about a safe weight loss goal.  Do not use any products that contain nicotine or tobacco, including cigarettes, e-cigarettes, and chewing tobacco. These can make your symptoms worse. If you need help quitting, ask your doctor.  Wear loose  clothes. Do not wear anything tight around your waist.  Raise (elevate) the head of your bed about 6 inches (15 cm) when you sleep.  Try to lower your stress. If you need help doing this, ask your doctor. General instructions  Pay attention to any changes in your symptoms.  Take over-the-counter and prescription medicines only as told by your doctor. ? Do not take aspirin, ibuprofen, or other NSAIDs unless your doctor says it is okay. ? Stop medicines only as told by your doctor.  Keep all follow-up visits as told by your doctor. This is important. Contact a doctor if:  You have new symptoms.  You lose weight and you do not know why it is happening.  You have trouble swallowing, or it hurts to swallow.  You have wheezing or a cough that keeps happening.  Your symptoms do not get better with treatment.  You have heartburn often for more than 2 weeks. Get help right away if:  You have pain in your arms, neck, jaw, teeth, or back.  You feel sweaty, dizzy, or light-headed.  You have chest pain or shortness of breath.  You throw up (vomit) and your throw up looks like blood or coffee grounds.  Your poop (stool) is bloody or black. These symptoms may represent a serious problem that is an emergency. Do not wait to see if the symptoms will go away. Get medical help right away. Call your local  emergency services (911 in the [place] Do not drive yourself to the hospital. Summary  Heartburn is a type of pain that can happen in the throat or chest. It can feel like a burning pain. It may also cause a bad, acid-like taste in the mouth.  You may need to avoid certain foods and drinks to help your symptoms. Ask your doctor what foods and drinks you should avoid.  Take over-the-counter and prescription medicines only as told by your doctor. Do not take aspirin, ibuprofen, or other NSAIDs unless your doctor told you to do so.  Contact your doctor if your symptoms do not get better or  they get worse. This information is not intended to replace advice given to you by your health care provider. Make sure you discuss any questions you have with your health care provider. Document Revised: 01/17/2018 Document Reviewed: 01/17/2018 Elsevier Patient Education  [place].

## 2020-03-26 ENCOUNTER — Encounter: Payer: Self-pay | Admitting: *Deleted

## 2020-03-26 ENCOUNTER — Encounter: Payer: Self-pay | Admitting: Obstetrics

## 2020-03-26 ENCOUNTER — Ambulatory Visit: Payer: Medicaid Other | Admitting: *Deleted

## 2020-03-26 ENCOUNTER — Ambulatory Visit: Payer: Medicaid Other | Attending: Obstetrics and Gynecology

## 2020-03-26 DIAGNOSIS — O99891 Other specified diseases and conditions complicating pregnancy: Secondary | ICD-10-CM

## 2020-03-26 DIAGNOSIS — O365930 Maternal care for other known or suspected poor fetal growth, third trimester, not applicable or unspecified: Secondary | ICD-10-CM

## 2020-03-26 DIAGNOSIS — Z362 Encounter for other antenatal screening follow-up: Secondary | ICD-10-CM | POA: Diagnosis not present

## 2020-03-26 DIAGNOSIS — Z3A31 31 weeks gestation of pregnancy: Secondary | ICD-10-CM

## 2020-03-26 DIAGNOSIS — O24410 Gestational diabetes mellitus in pregnancy, diet controlled: Secondary | ICD-10-CM

## 2020-03-26 DIAGNOSIS — J45909 Unspecified asthma, uncomplicated: Secondary | ICD-10-CM

## 2020-03-26 DIAGNOSIS — R8271 Bacteriuria: Secondary | ICD-10-CM | POA: Diagnosis present

## 2020-03-26 DIAGNOSIS — O359XX0 Maternal care for (suspected) fetal abnormality and damage, unspecified, not applicable or unspecified: Secondary | ICD-10-CM | POA: Diagnosis not present

## 2020-03-27 ENCOUNTER — Other Ambulatory Visit: Payer: Self-pay | Admitting: *Deleted

## 2020-03-27 DIAGNOSIS — O24410 Gestational diabetes mellitus in pregnancy, diet controlled: Secondary | ICD-10-CM

## 2020-03-30 ENCOUNTER — Inpatient Hospital Stay (HOSPITAL_COMMUNITY)
Admission: AD | Admit: 2020-03-30 | Discharge: 2020-03-30 | Disposition: A | Discharge status: Home / Self Care | Payer: Medicaid Other | Attending: Obstetrics and Gynecology | Admitting: Obstetrics and Gynecology

## 2020-03-30 ENCOUNTER — Other Ambulatory Visit: Payer: Self-pay

## 2020-03-30 ENCOUNTER — Encounter (HOSPITAL_COMMUNITY): Payer: Self-pay | Admitting: Obstetrics and Gynecology

## 2020-03-30 DIAGNOSIS — O99513 Diseases of the respiratory system complicating pregnancy, third trimester: Secondary | ICD-10-CM | POA: Diagnosis not present

## 2020-03-30 DIAGNOSIS — K0889 Other specified disorders of teeth and supporting structures: Secondary | ICD-10-CM | POA: Insufficient documentation

## 2020-03-30 DIAGNOSIS — Z79899 Other long term (current) drug therapy: Secondary | ICD-10-CM | POA: Insufficient documentation

## 2020-03-30 DIAGNOSIS — O219 Vomiting of pregnancy, unspecified: Secondary | ICD-10-CM

## 2020-03-30 DIAGNOSIS — O133 Gestational [pregnancy-induced] hypertension without significant proteinuria, third trimester: Secondary | ICD-10-CM | POA: Diagnosis not present

## 2020-03-30 DIAGNOSIS — R519 Headache, unspecified: Secondary | ICD-10-CM | POA: Diagnosis not present

## 2020-03-30 DIAGNOSIS — Z3A32 32 weeks gestation of pregnancy: Secondary | ICD-10-CM | POA: Diagnosis not present

## 2020-03-30 DIAGNOSIS — Z87891 Personal history of nicotine dependence: Secondary | ICD-10-CM | POA: Diagnosis not present

## 2020-03-30 DIAGNOSIS — J45909 Unspecified asthma, uncomplicated: Secondary | ICD-10-CM | POA: Diagnosis not present

## 2020-03-30 DIAGNOSIS — O26893 Other specified pregnancy related conditions, third trimester: Secondary | ICD-10-CM | POA: Insufficient documentation

## 2020-03-30 DIAGNOSIS — O212 Late vomiting of pregnancy: Secondary | ICD-10-CM | POA: Diagnosis not present

## 2020-03-30 DIAGNOSIS — Z3689 Encounter for other specified antenatal screening: Secondary | ICD-10-CM | POA: Insufficient documentation

## 2020-03-30 LAB — PROTEIN / CREATININE RATIO, URINE
Creatinine, Urine: 280.27 mg/dL
Protein Creatinine Ratio: 0.19 mg/mg{Cre} — ABNORMAL HIGH (ref 0.00–0.15)
Total Protein, Urine: 53 mg/dL

## 2020-03-30 LAB — URINALYSIS, ROUTINE W REFLEX MICROSCOPIC
Bacteria, UA: NONE SEEN
Bilirubin Urine: NEGATIVE
Glucose, UA: NEGATIVE mg/dL
Hgb urine dipstick: NEGATIVE
Ketones, ur: 80 mg/dL — AB
Leukocytes,Ua: NEGATIVE
Nitrite: NEGATIVE
Protein, ur: 100 mg/dL — AB
Specific Gravity, Urine: 1.036 — ABNORMAL HIGH (ref 1.005–1.030)
pH: 6 (ref 5.0–8.0)

## 2020-03-30 LAB — COMPREHENSIVE METABOLIC PANEL
ALT: 15 U/L (ref 0–44)
AST: 22 U/L (ref 15–41)
Albumin: 2.9 g/dL — ABNORMAL LOW (ref 3.5–5.0)
Alkaline Phosphatase: 76 U/L (ref 38–126)
Anion gap: 11 (ref 5–15)
BUN: <5 mg/dL — ABNORMAL LOW (ref 6–20)
CO2: 22 mmol/L (ref 22–32)
Calcium: 9.1 mg/dL (ref 8.9–10.3)
Chloride: 103 mmol/L (ref 98–111)
Creatinine, Ser: 0.82 mg/dL (ref 0.44–1.00)
GFR calc Af Amer: >60 mL/min (ref >60)
GFR calc non Af Amer: >60 mL/min (ref >60)
Glucose, Bld: 93 mg/dL (ref 70–99)
Potassium: 3.3 mmol/L — ABNORMAL LOW (ref 3.5–5.1)
Sodium: 136 mmol/L (ref 135–145)
Total Bilirubin: 0.5 mg/dL (ref 0.3–1.2)
Total Protein: 6.8 g/dL (ref 6.5–8.1)

## 2020-03-30 LAB — CBC
HCT: 35.1 % — ABNORMAL LOW (ref 36.0–46.0)
Hemoglobin: 11.6 g/dL — ABNORMAL LOW (ref 12.0–15.0)
MCH: 29.1 pg (ref 26.0–34.0)
MCHC: 33 g/dL (ref 30.0–36.0)
MCV: 88.2 fL (ref 80.0–100.0)
Platelets: 409 10*3/uL — ABNORMAL HIGH (ref 150–400)
RBC: 3.98 MIL/uL (ref 3.87–5.11)
RDW: 13.8 % (ref 11.5–15.5)
WBC: 14.2 10*3/uL — ABNORMAL HIGH (ref 4.0–10.5)
nRBC: 0.1 % (ref 0.0–0.2)

## 2020-03-30 MED ORDER — BUTALBITAL-APAP-CAFFEINE 50-325-40 MG PO TABS
2.0000 | ORAL_TABLET | Freq: Once | ORAL | Status: AC
  Administered 2020-03-30: 2 via ORAL
  Filled 2020-03-30: qty 2

## 2020-03-30 MED ORDER — BUTALBITAL-APAP-CAFFEINE 50-325-40 MG PO TABS: 1.0000 | ORAL_TABLET | Freq: Four times a day (QID) | ORAL | 0 refills | Status: DC | PRN

## 2020-03-30 MED ORDER — ONDANSETRON 4 MG PO TBDP
8.0000 mg | ORAL_TABLET | Freq: Once | ORAL | Status: AC
  Administered 2020-03-30: 8 mg via ORAL
  Filled 2020-03-30: qty 2

## 2020-03-30 NOTE — MAU Provider Note (Signed)
History     CSN: [id number]  Arrival date and time: 03/30/20 7672   First Provider Initiated Contact with Patient 03/30/20 2038      Chief Complaint  Patient presents with  . Headache  . Nausea  . Emesis During Pregnancy   Stacy Warner is a 24 y.o. G2P0010 at 57w2dwho receives care at [facility].  She presents today for Headache, Nausea, and Emesis.  She reports she has had a headache since "about 2 o'clock last night on and off."  Patient reports that she currently located on her left side and she describes it as a "throbbing...like my head has a heartbeat."  Patient rates the headache a 6/10, but goes on to state that she also has a toothache and contributes her headache to this.  Patient reports that she has completed her amoxicillin dosing, but the pain continues.  She plans to have the tooth extracted on Monday. Patient also reports current nausea and vomiting that started around 9am this morning.  Patient reports she has been unable to eat and keep anything down.  She reports she tried to take pepcid for her n/v, but was unable to keep that down.  Patient endorses fetal movement and denies vaginal concerns including leaking, discharge, and bleeding.  Patient also denies visual disturbances, SOB, or edema.    OB History    Gravida  2   Para      Term      Preterm      AB  1   Living        SAB      TAB  1   Ectopic      Multiple      Live Births              Past Medical History:  Diagnosis Date  . Anemia   . Asthma   . Chlamydia    age 24 . Infection    UTI    Past Surgical History:  Procedure Laterality Date  . TONSILLECTOMY      Family History  Problem Relation Age of Onset  . Heart disease Paternal Grandmother   . Stroke Paternal Grandmother   . Thyroid disease Paternal Grandfather     Social History   Tobacco Use  . Smoking status: Former Smoker    Packs/day: 0.25    Years: 1.00    Pack years: 0.25    Types: Cigars  .  Smokeless tobacco: Never Used  . Tobacco comment: black and mild  Vaping Use  . Vaping Use: Never used  Substance Use Topics  . Alcohol use: Not Currently    Comment: occasional, not since confirmed pregnancy  . Drug use: Not Currently    Types: Marijuana    Comment: not sinc e1st trimester    Allergies: No Known Allergies  Medications Prior to Admission  Medication Sig Dispense Refill Last Dose  . Accu-Chek Softclix Lancets lancets Use as instructed 100 each 12 03/29/2020 at Unknown time  . acetaminophen (TYLENOL) 500 MG tablet Take 500 mg by mouth every 6 (six) hours as needed for mild pain, moderate pain or headache.   03/30/2020 at Unknown time  . Blood Glucose Monitoring Suppl (ACCU-CHEK GUIDE) w/Device KIT 1 kit by Does not apply route daily. 1 kit 0 03/29/2020 at Unknown time  . Blood Pressure Monitor KIT 1 each by Does not apply route daily. 1 kit 0 Past Week at Unknown time  . famotidine (PEPCID) 20 MG  tablet Take 1 tablet (20 mg total) by mouth 2 (two) times daily. 60 tablet 1 Past Month at Unknown time  . glucose blood (ACCU-CHEK GUIDE) test strip Use as instructed 100 each 12 03/29/2020 at Unknown time  . Prenatal Vit-Fe Phos-FA-Omega (VITAFOL GUMMIES) 3.33-0.333-34.8 MG CHEW Chew 3 each by mouth daily. 90 tablet 11 03/30/2020 at Unknown time  . amoxicillin (AMOXIL) 500 MG tablet Take 500 mg by mouth every 8 (eight) hours.     Regino Schultze Bandages & Supports (COMFORT FIT MATERNITY SUPP SM) MISC Wear as directed. 1 each 0   . labetalol (NORMODYNE) 200 MG tablet Take 200 mg by mouth 2 (two) times daily.     Marland Kitchen loratadine (CLARITIN) 10 MG tablet Take 1 tablet (10 mg total) by mouth daily. (Patient not taking: Reported on 03/05/2020) 30 tablet 11 More than a month at Unknown time  . metoCLOPramide (REGLAN) 5 MG tablet Take 5 mg by mouth 3 (three) times daily.     . ondansetron (ZOFRAN) 4 MG tablet Take 1 tablet (4 mg total) by mouth every 8 (eight) hours as needed for nausea or vomiting.  (Patient not taking: Reported on 03/12/2020) 20 tablet 2     Review of Systems  Constitutional: Negative for chills and fever.  Eyes: Negative for visual disturbance.  Respiratory: Negative for cough and shortness of breath.   Gastrointestinal: Positive for nausea and vomiting. Negative for abdominal pain, constipation and diarrhea.  Genitourinary: Negative for difficulty urinating, dysuria, pelvic pain, vaginal bleeding and vaginal discharge.  Musculoskeletal: Negative for back pain.  Neurological: Positive for headaches. Negative for dizziness and light-headedness.   Physical Exam   Blood pressure (!) 132/98, pulse 95, temperature 99 F (37.2 C), temperature source Oral, resp. rate 16, height 5' 5" (1.651 m), weight 69 kg, last menstrual period 08/17/2019, SpO2 99 %. Vitals:   03/30/20 2100 03/30/20 2102 03/30/20 2110 03/30/20 2115  BP: (!) 147/91 (!) 140/98  (!) 133/82  Pulse: 87 95  96  Resp:      Temp:      TempSrc:      SpO2:   100% 100%  Weight:      Height:        Physical Exam Vitals reviewed.  Constitutional:      Appearance: She is well-developed.  HENT:     Head: Normocephalic and atraumatic.  Eyes:     Conjunctiva/sclera: Conjunctivae normal.  Cardiovascular:     Rate and Rhythm: Normal rate and regular rhythm.     Heart sounds: Normal heart sounds.  Pulmonary:     Effort: Pulmonary effort is normal. No respiratory distress.     Breath sounds: Normal breath sounds.  Abdominal:     Palpations: Abdomen is soft.     Tenderness: There is no abdominal tenderness.     Comments: Gravid--fundal height appears AGA, Soft, NT   Musculoskeletal:     Cervical back: Normal range of motion.  Skin:    General: Skin is warm and dry.  Neurological:     Mental Status: She is alert and oriented to person, place, and time.  Psychiatric:        Mood and Affect: Mood normal.        Speech: Speech normal.        Behavior: Behavior normal.        Thought Content: Thought  content normal.        Judgment: Judgment normal.     Fetal Assessment 140 bpm,  Mod Var, -Decels, +15x15 Accels Toco: None graphed  MAU Course   Results for orders placed or performed during the hospital encounter of 03/30/20 (from the past 24 hour(s))  Urinalysis, Routine w reflex microscopic     Status: Abnormal   Collection Time: 03/30/20  8:13 PM  Result Value Ref Range   Color, Urine YELLOW YELLOW   APPearance HAZY (A) CLEAR   Specific Gravity, Urine 1.036 (H) 1.005 - 1.030   pH 6.0 5.0 - 8.0   Glucose, UA NEGATIVE NEGATIVE mg/dL   Hgb urine dipstick NEGATIVE NEGATIVE   Bilirubin Urine NEGATIVE NEGATIVE   Ketones, ur 80 (A) NEGATIVE mg/dL   Protein, ur 100 (A) NEGATIVE mg/dL   Nitrite NEGATIVE NEGATIVE   Leukocytes,Ua NEGATIVE NEGATIVE   RBC / HPF 0-5 0 - 5 RBC/hpf   WBC, UA 0-5 0 - 5 WBC/hpf   Bacteria, UA NONE SEEN NONE SEEN   Squamous Epithelial / LPF 11-20 0 - 5   Mucus PRESENT   Protein / creatinine ratio, urine     Status: Abnormal   Collection Time: 03/30/20  8:13 PM  Result Value Ref Range   Creatinine, Urine 280.27 mg/dL   Total Protein, Urine 53 mg/dL   Protein Creatinine Ratio 0.19 (H) 0.00 - 0.15 mg/mg[Cre]  CBC     Status: Abnormal   Collection Time: 03/30/20  8:59 PM  Result Value Ref Range   WBC 14.2 (H) 4.0 - 10.5 K/uL   RBC 3.98 3.87 - 5.11 MIL/uL   Hemoglobin 11.6 (L) 12.0 - 15.0 g/dL   HCT 35.1 (L) 36 - 46 %   MCV 88.2 80.0 - 100.0 fL   MCH 29.1 26.0 - 34.0 pg   MCHC 33.0 30.0 - 36.0 g/dL   RDW 13.8 11.5 - 15.5 %   Platelets 409 (H) 150 - 400 K/uL   nRBC 0.1 0.0 - 0.2 %  Comprehensive metabolic panel     Status: Abnormal   Collection Time: 03/30/20  8:59 PM  Result Value Ref Range   Sodium 136 135 - 145 mmol/L   Potassium 3.3 (L) 3.5 - 5.1 mmol/L   Chloride 103 98 - 111 mmol/L   CO2 22 22 - 32 mmol/L   Glucose, Bld 93 70 - 99 mg/dL   BUN <5 (L) 6 - 20 mg/dL   Creatinine, Ser 0.82 0.44 - 1.00 mg/dL   Calcium 9.1 8.9 - 10.3 mg/dL    Total Protein 6.8 6.5 - 8.1 g/dL   Albumin 2.9 (L) 3.5 - 5.0 g/dL   AST 22 15 - 41 U/L   ALT 15 0 - 44 U/L   Alkaline Phosphatase 76 38 - 126 U/L   Total Bilirubin 0.5 0.3 - 1.2 mg/dL   GFR calc non Af Amer >60 >60 mL/min   GFR calc Af Amer >60 >60 mL/min   Anion gap 11 5 - 15   No results found.  MDM Physical Exam Labs: CBC, CMP, PC Ratio Measure BPQ15 min EFM Pain Management AntiEmetic Assessment and Plan  24 year old G2P0010  SIUP at 32.2weeks Cat I FT Headache  Elevated BP N/V  -POC reviewed.  -Exam performed. -Discussed treating nausea then HA. -Informed that initial dosing would be with oral medication, but would give IV if unsuccessful. -Give Zofran 49m ODT now. -NST reactive.   JMaryann ConnersMSN, CNM 03/30/2020, 8:38 PM   Reassessment (9:46 PM)  -Labs return as above. -Provider to bedside to discuss. -Informed that diagnosis of  GHTN will be given considering h/o elevated bp. -Nausea resolved. -Give Fioricet 2 tablets now.  Reassessment (10:35 PM)  -Patient reports improvement in HA. -Requests and given script for 20 tablets. Sent to 24-hr pharmacy per patient request. -Medications reviewed.  -Patient with script for Labetalol, but states she never took it because it was given 'just in case.' -Instructed to not start and to monitor blood pressure at home.  Patient to discuss blood pressures at next PNV.  -Patient questions if tooth extraction can cause PTL.  Discussed how benefits of tooth extraction and relief of pain outweigh risks of continued pain and potential PTL that can be managed/resolved. -Patient verbalized understanding and without further questions.  -Encouraged to call or return to MAU if symptoms worsen or with the onset of new symptoms. -Discharged to home in improved condition.  Maryann Conners MSN, CNM Advanced Practice Provider, [facility] [employer] [facility]

## 2020-03-30 NOTE — MAU Note (Addendum)
..  Stacy Warner is a 24 y.o. at [redacted]w[redacted]d here in MAU reporting: a headache that pt believes arose from a tooth infection. Pt states she is going on Monday 04/01/20 to get the tooth removed. Denies visual changes. Denies any current epigastric pain.  Pt is also complaining of Nausea and vomiting, pt estimates around 15 emesis episodes. +FM. Denies vaginal bleeding or LOF.  Onset of complaint: 0900 03/30/20 Pain score: 6/10 Vitals:   03/30/20 1959  BP: (!) 149/89  Pulse: 88  Resp: 16  Temp: 99 F (37.2 C)  SpO2: 100%     FHT: monitors applied Lab orders placed from triage: UA

## 2020-03-30 NOTE — Discharge Instructions (Signed)

## 2020-04-02 ENCOUNTER — Ambulatory Visit: Payer: Medicaid Other | Attending: Obstetrics and Gynecology

## 2020-04-02 ENCOUNTER — Ambulatory Visit: Payer: Medicaid Other | Admitting: *Deleted

## 2020-04-02 ENCOUNTER — Other Ambulatory Visit: Payer: Self-pay

## 2020-04-02 DIAGNOSIS — O99891 Other specified diseases and conditions complicating pregnancy: Secondary | ICD-10-CM | POA: Diagnosis present

## 2020-04-02 DIAGNOSIS — O99513 Diseases of the respiratory system complicating pregnancy, third trimester: Secondary | ICD-10-CM

## 2020-04-02 DIAGNOSIS — Z362 Encounter for other antenatal screening follow-up: Secondary | ICD-10-CM

## 2020-04-02 DIAGNOSIS — O365930 Maternal care for other known or suspected poor fetal growth, third trimester, not applicable or unspecified: Secondary | ICD-10-CM | POA: Diagnosis present

## 2020-04-02 DIAGNOSIS — R8271 Bacteriuria: Secondary | ICD-10-CM

## 2020-04-02 DIAGNOSIS — O24410 Gestational diabetes mellitus in pregnancy, diet controlled: Secondary | ICD-10-CM

## 2020-04-02 DIAGNOSIS — O359XX0 Maternal care for (suspected) fetal abnormality and damage, unspecified, not applicable or unspecified: Secondary | ICD-10-CM

## 2020-04-02 DIAGNOSIS — O133 Gestational [pregnancy-induced] hypertension without significant proteinuria, third trimester: Secondary | ICD-10-CM | POA: Diagnosis not present

## 2020-04-02 DIAGNOSIS — J45909 Unspecified asthma, uncomplicated: Secondary | ICD-10-CM

## 2020-04-02 DIAGNOSIS — Z3A32 32 weeks gestation of pregnancy: Secondary | ICD-10-CM

## 2020-04-03 ENCOUNTER — Other Ambulatory Visit: Payer: Self-pay | Admitting: Obstetrics

## 2020-04-03 DIAGNOSIS — Z3480 Encounter for supervision of other normal pregnancy, unspecified trimester: Secondary | ICD-10-CM

## 2020-04-05 ENCOUNTER — Ambulatory Visit (INDEPENDENT_AMBULATORY_CARE_PROVIDER_SITE_OTHER): Payer: Medicaid Other | Admitting: Obstetrics and Gynecology

## 2020-04-05 ENCOUNTER — Other Ambulatory Visit: Payer: Self-pay

## 2020-04-05 VITALS — BP 141/95 | HR 89 | Wt 155.7 lb

## 2020-04-05 DIAGNOSIS — O24410 Gestational diabetes mellitus in pregnancy, diet controlled: Secondary | ICD-10-CM

## 2020-04-05 DIAGNOSIS — F1210 Cannabis abuse, uncomplicated: Secondary | ICD-10-CM

## 2020-04-05 DIAGNOSIS — O133 Gestational [pregnancy-induced] hypertension without significant proteinuria, third trimester: Secondary | ICD-10-CM

## 2020-04-05 DIAGNOSIS — Z3A33 33 weeks gestation of pregnancy: Secondary | ICD-10-CM | POA: Insufficient documentation

## 2020-04-05 DIAGNOSIS — J4520 Mild intermittent asthma, uncomplicated: Secondary | ICD-10-CM

## 2020-04-05 DIAGNOSIS — O0993 Supervision of high risk pregnancy, unspecified, third trimester: Secondary | ICD-10-CM

## 2020-04-05 NOTE — Progress Notes (Signed)
Patient reports having a tooth pull this past week. She is taking Amoxicillin.

## 2020-04-05 NOTE — Patient Instructions (Addendum)
Gestational Diabetes Mellitus, Self Care When you have gestational diabetes (gestational diabetes mellitus), you must make sure your blood sugar (glucose) stays in a healthy range. You can do this with:  Nutrition.  Exercise.  Lifestyle changes.  Medicines or insulin, if needed.  Support from your doctors and others. If you get treated for this condition, it may not hurt you or your unborn baby (fetus). If you do not get treated for this condition, it may cause problems that can hurt you or your unborn baby. If you get gestational diabetes, you are:  More likely to get it if you get pregnant again.  More likely to develop type 2 diabetes in the future. How to stay aware of blood sugar   Check your blood sugar every day while you are pregnant. Check it as often as told.  Call your doctor if your blood sugar is above your goal numbers for two tests in a row. Your doctor will set personal treatment goals for you. Generally, you should have these blood sugar levels:  Before meals, or after not eating for a long time (fasting or preprandial): at or below 95 mg/dL (5.3 mmol/L).  After meals (postprandial): ? One hour after a meal: at or below 140 mg/dL (7.8 mmol/L). ? Two hours after a meal: at or below 120 mg/dL (6.7 mmol/L).  A1c (hemoglobin A1c) level: 6-6.5%. How to manage high and low blood sugar Signs of high blood sugar High blood sugar is called hyperglycemia. Know the early signs of high blood sugar. Signs may include:  Feeling: ? Thirsty. ? Hungry. ? Very tired.  Needing to pee (urinate) more than usual.  Blurry vision. Signs of low blood sugar Low blood sugar is called hypoglycemia. This is when blood sugar is at or below 70 mg/dL (3.9 mmol/L). Signs may include:  Feeling: ? Hungry. ? Worried or nervous (anxious). ? Sweaty and clammy. ? Confused. ? Dizzy. ? Sleepy. ? Sick to your stomach (nauseous).  Having: ? A fast heartbeat. ? A headache. ? A change  in your vision. ? Tingling or no feeling (numbness) around your mouth, lips, or tongue. ? Jerky movements that you cannot control (seizure).  Having trouble with: ? Moving (coordination). ? Sleeping. ? Passing out (fainting). ? Getting upset easily (irritability). Treating low blood sugar To treat low blood sugar, eat or drink something sugary right away. If you can think clearly and swallow safely, follow the 15:15 rule:  Take 15 grams of a fast-acting carb (carbohydrate). Talk with your doctor about how much you should take.  Some fast-acting carbs are: ? Sugar tablets (glucose pills). Take 3-4 glucose pills. ? 6-8 pieces of hard candy. ? 4-6 oz (120-150 mL) of fruit juice. ? 4-6 oz (120-150 mL) of regular (not diet) soda. ? 1 Tbsp (15 mL) honey or sugar.  Check your blood sugar 15 minutes after you take the carb.  If your blood sugar is still at or below 70 mg/dL (3.9 mmol/L), take 15 grams of a carb again.  If your blood sugar does not go above 70 mg/dL (3.9 mmol/L) after 3 tries, get help right away.  After your blood sugar goes back to normal, eat a meal or a snack within 1 hour. Treating very low blood sugar If your blood sugar is at or below 54 mg/dL (3 mmol/L), you have very low blood sugar (severe hypoglycemia). This is an emergency. Do not wait to see if the symptoms will go away. Get medical help right  away. Call your local emergency services (911 in the [place] If you have very low blood sugar and you cannot eat or drink, you may need a glucagon shot (injection). A family member or friend should learn how to check your blood sugar and how to give you a glucagon shot. Ask your doctor if you need to have a glucagon shot kit at home. Follow these instructions at home: Medicine  Take your insulin and diabetes medicines as told.  If your doctor says you should take more or less insulin or medicines, do this exactly as told.  Do not run out of insulin or  medicines. Food   Make healthy food choices. These include: ? Chicken, fish, egg whites, and beans. ? Oats, whole wheat, bulgur, brown rice, quinoa, and millet. ? Fresh fruits and vegetables. ? Low-fat dairy products. ? Nuts, avocado, olive oil, and canola oil.  Meet with a food specialist (dietitian). He or she can help you make an eating plan that is right for you.  Follow instructions from your doctor about what you cannot eat or drink.  Drink enough fluid to keep your pee (urine) pale yellow.  Eat healthy snacks between healthy meals.  Keep track of carbs that you eat. Do this by reading food labels and learning food serving sizes.  Follow your sick day plan when you cannot eat or drink normally. Make this plan with your doctor so it is ready to use. Activity  Exercise for 30 or more minutes a day, or as much as your doctor recommends.  Talk with your doctor before you start a new exercise or activity. Your doctor may need to tell you to change: ? How much insulin or medicines you take. ? How much food you eat. Lifestyle  Do not drink alcohol.  Do not use any tobacco products. These include cigarettes, chewing tobacco, and e-cigarettes. If you need help quitting, ask your doctor.  Learn how to deal with stress. If you need help with this, ask your doctor. Body care  Stay up to date with your shots (immunizations).  Brush your teeth and gums two times a day. Floss one or more times a day.  Go to the dentist one or more times every 6 months.  Stay at a healthy weight while you are pregnant. General instructions  Take over-the-counter and prescription medicines only as told by your doctor.  Ask your doctor about risks of high blood pressure in pregnancy (preeclampsia and eclampsia).  Share your diabetes care plan with: ? Your work or school. ? People you live with.  Check your pee for ketones: ? When you are sick. ? As told by your doctor.  Carry a card or  wear jewelry that says you have diabetes.  Keep all follow-up visits as told by your doctor. This is important. Care after giving birth  Have your blood sugar checked 4-12 weeks after you give birth.  Get checked for diabetes one or more times during 3 years. Questions to ask your doctor  Do I need to meet with a diabetes educator?  Where can I find a support group for people with gestational diabetes? Where to find more information To learn more about diabetes, visit:  American Diabetes Association: www.diabetes.org  Centers for Disease Control and Prevention (CDC): www.cdc.gov Summary  Check your blood sugar (glucose) every day while you are pregnant. Check it as often as told.  Take your insulin and diabetes medicines as told.  Keep all follow-up visits as   told by your doctor. This is important.  Have your blood sugar checked 4-12 weeks after you give birth. This information is not intended to replace advice given to you by your health care provider. Make sure you discuss any questions you have with your health care provider. Document Revised: 02/07/2018 Document Reviewed: 09/20/2015 Elsevier Patient Education  2020 [place] [phone]) . [place] o [place]., [place], [place] [address] o [phone] o Mon-Fri 8:30-12:30, 1:30-5:00 o Accepting Medicaid . [place] at [facility] [place], [place], [place] [address] o [phone] o Mon-Fri 8:00-5:30 . [place] [place], [place] [address] o [phone], Tue, Thur, Fri 8:30-5:00, Wed 10:00-7:00 (closed 1-2pm) o Accepting Medicaid . [facility] o [address], [place], [place]  [address] o Phone - [phone]   Fax - [phone]  [place] [phone]) . [place].,  [place], [place] [address] o [phone] o Mon-Fri 8:00-5:00 . [facility] at [facility])  o [address] [place], [place] [address] o [phone] o Mon-Fri 8:30-5:30, Sat (Oct.-Mar.) 9:00-1:00 o Accepting [facility] [phone]) . [place] at [place], [place], [place] [address] o [phone] o Mon-Fri 8:00-5:00  [place] [phone]) . [place] at [place], [place], [place] [address] o [phone] o Mon-Fri 8:00-5:00 . Therapist, music at [place], [place], [place] [address] o [phone] o Mon-Fri 8:00-5:00 . Therapist, music at [place]., [place], [place] [address] o [phone] o Mon-Fri 8:00-5:00 . [place]., [place] [address] o [phone] o Mon-Fri 7:30-5:30  [place] ([place]) . [place] [place], [place] [address] o [phone] o Mon-Thur 8:00-6:00 o Accepting Medicaid . [place]., [place], [place] [address] o [phone] o Mon-Thur 7:30-7:30, Fri 7:30-4:30 o Accepting Medicaid . [place] at [place] [address] [place], [place]  [address] o [phone]   Fax - [place] [phone] & [phone]) . Therapist, music at [place]., [place], [place] [address] o [phone] o Mon-Fri 7:00-5:00 . [place], [place], [place] [address] o [phone] o Mon-Fri 8:00-5:00 o Accepting Medicaid . [place], [place], [place] [address] o [phone] o Mon-Fri 8:00-5:00 o Accepting Medicaid  [facility]  [place] [phone]) . [facility] at [facility] o [address]., [place], [place] [address] o [phone] o Mon-Fri 8:00-5:00 . [place] ([place] at [employer]) o [address] [place], [place], [place] [address] o [phone] o Mon-Fri 8:00-5:00 o Accepting Medicaid . [place] ([facility] [employer]) o [address] [place], [place], [place] [address] o [phone] o Mon-Fri 8:00-5:30, Sat&Sun by appointment (phones open at 8:30) o Accepting [facility] [phone] & [phone]) . [facility] o [address], [place], [place] [address] o [phone] o Mon-Thur 8:00-7:00, Fri 8:00-5:00, Sat 8:00-12:00, Sun 9:00-12:00 o Accepting Medicaid . [facility] at [place] [address], [place], [place] [address] o [phone] o Mon-Thur 8:00-5:00 o Accepting Medicaid . [facility] at [place]., [place], [place] [address] o [phone] o Mon-Fri 8:00-5:30, Sat (Oct.-Mar.) 9:00-1:00 o Accepting [employer] [phone]) . [place] o [address] [place], [place], [place] [address] o [phone] o Mon-Fri 8:00-5:00 o Accepting Medicaid   [facility] [phone]) . [place] at [place], [place], [place] [address] o [phone] o Mon-Fri 8:00-5:00 . Therapist, music at [facility] o [address], [place], [place] [address] o [phone] o Mon-Fri 8:00-5:00 . [place] [address], [place], [place] [address] o [phone] o Mon-Fri 8:00-5:00 o After hours clinic [facility] [place], [place] [address]) [phone] Mon-Fri 5:00-8:00, Sat 12:00-6:00, Sun 10:00-4:00 o Accepting Medicaid . [place] at [facility] o [address] [place], [place]  [address] o [phone]   Fax - [phone]  [place] [phone]) . Therapist, music at [place] o [address] [place], [place] [address] o [phone] o Mon-Fri 8:00-5:00 . [facility] [place]) o [place] [address], [place],  [address] o [phone] o Mon-Thur 8:00-7:00, Fri 8:00-5:00, Sat 8:00-12:00

## 2020-04-05 NOTE — Progress Notes (Addendum)
   PRENATAL VISIT NOTE  Subjective:  Stacy Warner is a 24 y.o. G2P0010 at [redacted]w[redacted]d being seen today for ongoing prenatal care.  She is currently monitored for the following issues for this high-risk pregnancy and has Iron deficiency anemia; Thrombocytosis ([place]); Tobacco abuse; Marijuana abuse; Supervision of high-risk pregnancy; Asymptomatic bacteriuria during pregnancy in first trimester; Asthma; Pyelonephritis affecting pregnancy; Elevated BP without diagnosis of hypertension; Gestational diabetes mellitus (GDM) in third trimester; and Gestational hypertension w/o significant proteinuria in 3rd trimester on their problem list.  Patient doing well with no acute concerns today. She reports no complaints.   .  .  Movement: Present. Denies leaking of fluid.   The following portions of the patient's history were reviewed and updated as appropriate: allergies, current medications, past family history, past medical history, past social history, past surgical history and problem list. Problem list updated.  Objective:   Vitals:   04/05/20 0850  BP: (!) 141/95  Pulse: 89  Weight: 155 lb 11.2 oz (70.6 kg)    Fetal Status: Fetal Heart Rate (bpm): 143   Movement: Present     General:  Alert, oriented and cooperative. Patient is in no acute distress.  Skin: Skin is warm and dry. No rash noted.   Cardiovascular: Normal heart rate noted  Respiratory: Normal respiratory effort, no problems with respiration noted  Abdomen: Soft, gravid, appropriate for gestational age.  Pain/Pressure: Absent     Pelvic: Cervical exam deferred        Extremities: Normal range of motion.  Edema: None  Mental Status:  Normal mood and affect. Normal behavior. Normal judgment and thought content.   Assessment and Plan:  Pregnancy: G2P0010 at [redacted]w[redacted]d  1. Gestational hypertension w/o significant proteinuria in 3rd trimester Pt has rx for labetalol, but has not started, advised to pick it up with BID dosing, BP check in 1  week Weekly testing has started, next scan at 8/13  2. Mild intermittent asthma without complication   3. Marijuana abuse   4. Supervision of high risk pregnancy in third trimester   5. Diet controlled gestational diabetes mellitus (GDM) in third trimester Good blood sugar control FBS: 87-93 two outliers of 105 and 11, pt advised to tighten up on diet especially at dinner PPBS: 88-121  6. [redacted] weeks pregnant  Preterm labor symptoms and general obstetric precautions including but not limited to vaginal bleeding, contractions, leaking of fluid and fetal movement were reviewed in detail with the patient.  Please refer to After Visit Summary for other counseling recommendations.   Return in about 2 weeks (around 04/19/2020) for [facility], in person.   Lynnda Shields, MD

## 2020-04-11 ENCOUNTER — Ambulatory Visit: Payer: Medicaid Other

## 2020-04-12 ENCOUNTER — Ambulatory Visit: Payer: Medicaid Other

## 2020-04-19 ENCOUNTER — Encounter: Payer: Self-pay | Admitting: *Deleted

## 2020-04-19 ENCOUNTER — Ambulatory Visit (INDEPENDENT_AMBULATORY_CARE_PROVIDER_SITE_OTHER): Payer: Medicaid Other | Admitting: Family Medicine

## 2020-04-19 ENCOUNTER — Ambulatory Visit: Payer: Medicaid Other | Attending: Obstetrics and Gynecology

## 2020-04-19 ENCOUNTER — Other Ambulatory Visit: Payer: Self-pay

## 2020-04-19 ENCOUNTER — Ambulatory Visit: Payer: Medicaid Other | Admitting: *Deleted

## 2020-04-19 VITALS — BP 132/96 | HR 85 | Wt 161.1 lb

## 2020-04-19 DIAGNOSIS — O24410 Gestational diabetes mellitus in pregnancy, diet controlled: Secondary | ICD-10-CM | POA: Diagnosis not present

## 2020-04-19 DIAGNOSIS — Z362 Encounter for other antenatal screening follow-up: Secondary | ICD-10-CM

## 2020-04-19 DIAGNOSIS — O99891 Other specified diseases and conditions complicating pregnancy: Secondary | ICD-10-CM

## 2020-04-19 DIAGNOSIS — O365930 Maternal care for other known or suspected poor fetal growth, third trimester, not applicable or unspecified: Secondary | ICD-10-CM

## 2020-04-19 DIAGNOSIS — J4520 Mild intermittent asthma, uncomplicated: Secondary | ICD-10-CM

## 2020-04-19 DIAGNOSIS — R8271 Bacteriuria: Secondary | ICD-10-CM

## 2020-04-19 DIAGNOSIS — D473 Essential (hemorrhagic) thrombocythemia: Secondary | ICD-10-CM

## 2020-04-19 DIAGNOSIS — O133 Gestational [pregnancy-induced] hypertension without significant proteinuria, third trimester: Secondary | ICD-10-CM

## 2020-04-19 DIAGNOSIS — O0993 Supervision of high risk pregnancy, unspecified, third trimester: Secondary | ICD-10-CM | POA: Insufficient documentation

## 2020-04-19 DIAGNOSIS — O2302 Infections of kidney in pregnancy, second trimester: Secondary | ICD-10-CM

## 2020-04-19 DIAGNOSIS — J45909 Unspecified asthma, uncomplicated: Secondary | ICD-10-CM

## 2020-04-19 DIAGNOSIS — Z720 Tobacco use: Secondary | ICD-10-CM

## 2020-04-19 DIAGNOSIS — O359XX0 Maternal care for (suspected) fetal abnormality and damage, unspecified, not applicable or unspecified: Secondary | ICD-10-CM

## 2020-04-19 DIAGNOSIS — F1210 Cannabis abuse, uncomplicated: Secondary | ICD-10-CM

## 2020-04-19 DIAGNOSIS — D75839 Thrombocytosis, unspecified: Secondary | ICD-10-CM

## 2020-04-19 DIAGNOSIS — D509 Iron deficiency anemia, unspecified: Secondary | ICD-10-CM

## 2020-04-19 DIAGNOSIS — O99333 Smoking (tobacco) complicating pregnancy, third trimester: Secondary | ICD-10-CM

## 2020-04-19 DIAGNOSIS — F17200 Nicotine dependence, unspecified, uncomplicated: Secondary | ICD-10-CM

## 2020-04-19 DIAGNOSIS — Z3A35 35 weeks gestation of pregnancy: Secondary | ICD-10-CM

## 2020-04-19 NOTE — Progress Notes (Signed)
Pt is here for ROB, [redacted]w[redacted]d.

## 2020-04-19 NOTE — Progress Notes (Addendum)
OBSTETRICS PRENATAL VIRTUAL VISIT ENCOUNTER NOTE  Provider location: [facility]   I connected with Stacy Warner on 04/19/20 at  8:55 AM EDT by MyChart Video Encounter at home and verified that I am speaking with the correct person using two identifiers.   I discussed the limitations, risks, security and privacy concerns of performing an evaluation and management service virtually and the availability of in person appointments. I also discussed with the patient that there may be a patient responsible charge related to this service. The patient expressed understanding and agreed to proceed. Subjective:  Stacy Warner is a 24 y.o. G2P0010 at [redacted]w[redacted]d being seen today for ongoing prenatal care.  She is currently monitored for the following issues for this high-risk pregnancy and has Iron deficiency anemia; Thrombocytosis ([place]); Tobacco abuse; Marijuana abuse; Supervision of high-risk pregnancy; Asymptomatic bacteriuria during pregnancy in first trimester; Asthma; Pyelonephritis affecting pregnancy; Elevated BP without diagnosis of hypertension; Gestational diabetes mellitus (GDM) in third trimester; Gestational hypertension w/o significant proteinuria in 3rd trimester; and [redacted] weeks gestation of pregnancy on their problem list.  Patient reports no complaints.  Contractions: Not present. Vag. Bleeding: None.  Movement: Present. Denies any leaking of fluid.   GHTN: prescribed labetalol 200 mg BID, compliant with regimen. Denies headaches, vision changes, cp, sob, le edema. BP remains elevated.  A1GDM: not reliably taking BGL. Has made diet modifications. Fasting last week 85.  The following portions of the patient's history were reviewed and updated as appropriate: allergies, current medications, past family history, past medical history, past social history, past surgical history and problem list.   Objective:   Vitals:   04/19/20 0850 04/19/20 0855  BP: (!) 145/98  (!) 132/96  Pulse: 66 85  Weight: 161 lb 1.6 oz (73.1 kg)     Fetal Status: Fetal Heart Rate (bpm): 135   Movement: Present     Fetal Status:     Movement: Present     General:  Alert, oriented and cooperative. Patient is in no acute distress.  Skin: Skin is warm and dry. No rash noted.   Cardiovascular: Normal heart rate noted  Respiratory: Normal respiratory effort, no problems with respiration noted  Abdomen: Soft, gravid, appropriate for gestational age.  Pain/Pressure: Absent     Pelvic: Cervical exam deferred        Extremities: Normal range of motion.     Mental Status: Normal mood and affect. Normal behavior. Normal judgment and thought content.    Imaging: [place] MFM FETAL BPP WO NON STRESS  Result Date: 04/02/2020 ----------------------------------------------------------------------  OBSTETRICS REPORT                       (Signed Final 04/02/2020 04:00 pm) ---------------------------------------------------------------------- Patient Info  ID #:       [id number]                          D.O.B.:  1996-01-02 (24 yrs)  Name:       Stacy Warner                 Visit Date: 04/02/2020 03:33 pm ---------------------------------------------------------------------- Performed By  Attending:        Johnell Comings MD         Ref. Address:     [address] [place]                                                             [place], [place]  Performed By:     Georgie Chard,       [facility]                                                             [facility]                                                             [facility]  Referred By:      Shelly Bombard MD ---------------------------------------------------------------------- Orders  #   Description                           Code        Ordered By  1  [place] MFM FETAL BPP WO NON               76819.01    [place]  2  [place] MFM UA CORD DOPPLER                16109.60    Sander Nephew ----------------------------------------------------------------------  #  Order #                     Accession #  Episode #  1  [id number]                   [id number]                 [id number]  2  [id number]                   [id number]                 [id number] ---------------------------------------------------------------------- Indications  Maternal care for known or suspected poor      O36.5930  fetal growth, third trimester, not applicable or  unspecified IUGR  Gestational diabetes in pregnancy, diet        O24.410  controlled  Elevated blood pressure affecting pregnancy    O13.3  in third trimester  [redacted] weeks gestation of pregnancy                Z3A.32  Fetal abnormality - other known or             O35.9XX0  suspected (EIF)  Asthma                                         O99.89 j45.909  Encounter for other antenatal screening        Z36.2  follow-up ---------------------------------------------------------------------- Fetal Evaluation  Num Of Fetuses:         1  Fetal Heart Rate(bpm):  143  Cardiac Activity:       Observed  Presentation:           Cephalic  Placenta:               Posterior  Amniotic Fluid  AFI FV:      Within normal limits  AFI Sum(cm)     %Tile       Largest Pocket(cm)  17.44           64          5.5  RUQ(cm)       RLQ(cm)       LUQ(cm)        LLQ(cm)  5.02          3.67          5.5            3.25 ---------------------------------------------------------------------- Biophysical Evaluation  Amniotic F.V:   Within normal limits       F. Tone:        Observed  F. Movement:    Observed                   Score:          8/8  F. Breathing:   Observed  ---------------------------------------------------------------------- OB History  Gravidity:    2  TOP:          1        Living:  0 ---------------------------------------------------------------------- Gestational Age  LMP:           32w 5d        Date:  08/17/19                 EDD:   05/23/20  Best:          Milderd Meager 5d     Det. By:  LMP  (08/17/19)          EDD:  05/23/20 ---------------------------------------------------------------------- Doppler - Fetal Vessels  Umbilical Artery   S/D     %tile                                              ADFV    RDFV   4.43   > 97.5                                                 No      No ---------------------------------------------------------------------- Comments  This patient was seen for a biophysical profile due to  gestational diabetes and fetal growth restriction that was  noted during her prior ultrasound exams.  She denies any  problems since her last exam and reports feeling vigorous  fetal movements.  A biophysical profile performed today was 8 out of 8.  There was normal amniotic fluid noted on today's ultrasound  exam.  The umbilical artery Doppler studies continue to show an  elevated S/D ratio of 4.43.  There were no signs of absent or  reversed end-diastolic flow noted today.  She will return in 1 week for another biophysical profile and  umbilical artery Doppler study. ----------------------------------------------------------------------                   Johnell Comings, MD Electronically Signed Final Report   04/02/2020 04:00 pm ----------------------------------------------------------------------  [place] MFM OB FOLLOW UP  Result Date: 03/26/2020 ----------------------------------------------------------------------  OBSTETRICS REPORT                       (Signed Final 03/26/2020 04:59 pm) ---------------------------------------------------------------------- Patient Info  ID #:       [id number]                          D.O.B.:  24-Nov-1995 (24 yrs)  Name:        Stacy Warner                 Visit Date: 03/26/2020 04:35 pm ---------------------------------------------------------------------- Performed By  Attending:        Tama High MD        Ref. Address:     [place], [place]  [place]  Performed By:     Jeanene Erb BS,      [facility]                                     [facility]                                                             [facility]                                                             Women  Referred By:      Shelly Bombard MD ---------------------------------------------------------------------- Orders  #  Description                           Code        Ordered By  1  [place] MFM OB FOLLOW UP                   76816.01    Sander Nephew  2  [place] MFM UA CORD DOPPLER                76820.02    Sander Nephew ----------------------------------------------------------------------  #  Order #                     Accession #                Episode #  1  [id number]                   [id number]                 [id number]  2  [id number]                   [id number]                 [id number] ---------------------------------------------------------------------- Indications  Gestational diabetes in pregnancy, diet        O24.410  controlled  Maternal care for  known or suspected poor      O36.5930  fetal growth, third trimester, not applicable or  unspecified IUGR  [redacted] weeks gestation of pregnancy                Z3A.31  Elevated blood pressure affecting pregnancy    O13.3  in third trimester  Fetal abnormality - other known or             O35.9XX0  suspected (EIF)   Asthma                                         O99.89 j45.909  Encounter for other antenatal screening        Z36.2  follow-up ---------------------------------------------------------------------- Fetal Evaluation  Num Of Fetuses:         1  Fetal Heart Rate(bpm):  141  Cardiac Activity:       Observed  Presentation:           Cephalic  Placenta:               Posterior  P. Cord Insertion:      Previously Visualized  Amniotic Fluid  AFI FV:      Within normal limits  AFI Sum(cm)     %Tile       Largest Pocket(cm)  12.2            33          3.7  RUQ(cm)       RLQ(cm)       LUQ(cm)        LLQ(cm)  3.6           1.4           3.5            3.7 ---------------------------------------------------------------------- Biometry  BPD:      79.4  mm     G. Age:  31w 6d         45  %    CI:        77.55   %    70 - 86                                                          FL/HC:      20.7   %    19.1 - 21.3  HC:      285.4  mm     G. Age:  31w 2d          9  %    HC/AC:      1.05        0.96 - 1.17  AC:      272.7  mm     G. Age:  31w 2d         37  %    FL/BPD:     74.4   %    71 - 87  FL:       59.1  mm     G. Age:  30w 6d         16  %    FL/AC:      21.7   %  20 - 24  Est. FW:    1725  gm    3 lb 13 oz      24  % ---------------------------------------------------------------------- OB History  Gravidity:    2  TOP:          1        Living:  0 ---------------------------------------------------------------------- Gestational Age  LMP:           31w 5d        Date:  08/17/19                 EDD:   05/23/20  U/S Today:     31w 2d                                        EDD:   05/26/20  Best:          31w 5d     Det. By:  LMP  (08/17/19)          EDD:   05/23/20 ---------------------------------------------------------------------- Anatomy  Cranium:               Appears normal         LVOT:                   Previously seen  Cavum:                 Previously seen        Aortic Arch:            Previously seen   Ventricles:            Appears normal         Ductal Arch:            Previously seen  Choroid Plexus:        Previously seen        Diaphragm:              Appears normal  Cerebellum:            Previously seen        Stomach:                Appears normal, left                                                                        sided  Posterior Fossa:       Previously seen        Abdomen:                Appears normal  Nuchal Fold:           Not applicable (>52    Abdominal Wall:         Previously seen                         wks GA)  Face:                  Orbits and profile     Cord Vessels:  Previously seen                         previously seen  Lips:                  Previously seen        Kidneys:                Appear normal  Palate:                Not well visualized    Bladder:                Appears normal  Thoracic:              Appears normal         Spine:                  Previously seen  Heart:                 Echogenic focus        Upper Extremities:      Previously seen                         in LV prev  RVOT:                  Previously seen        Lower Extremities:      Previously seen  Other:  Female gender previously seen. 3VV and 3VTV previously visualized.          Nasal bone previously visualized. Open hands, Heels and 5th digit          previously visualized. ---------------------------------------------------------------------- Doppler - Fetal Vessels  Umbilical Artery   S/D     %tile      RI    %tile      PI    %tile            ADFV    RDFV   4.33   > 97.5    0.77       97    1.26       95               No      No ---------------------------------------------------------------------- Cervix Uterus Adnexa  Cervix  Normal appearance by transabdominal scan. ---------------------------------------------------------------------- Impression  Patient with fetal growth restriction return to for fetal growth  assessment and antenatal testing.  On previous ultrasound  umbilical  artery Doppler showed increased S/D ratio.  Her blood pressure today at our office were 132/97 mmHg  and 133/86 mm Hg. Patient does not have symptoms of  severe features of preeclampsia.  She has gestational diabetes that is well controlled on diet.  Labs drawn on 03/25/2020 showed normal platelet count and  normal liver enzymes.  Serum creatinine was 0.74.  On ultrasound, amniotic fluid is normal good fetal activity  seen.  Fetal growth is appropriate for gestational age.  The  estimated fetal weight is at the 24th percentile.  Umbilical  artery Doppler showed increased S/D ratio.  I explained the findings and reassured her of normal fetal  growth.  Abnormal umbilical artery Doppler in the presence of  normal fetal growth is in itself not an indication for weekly  antenatal testing.  However, because of fetal growth  restriction on previous ultrasound and that she has  gestational diabetes, we  recommend weekly antenatal testing. ---------------------------------------------------------------------- Recommendations  -BPP and UA Doppler next week. ----------------------------------------------------------------------                  Tama High, MD Electronically Signed Final Report   03/26/2020 04:59 pm ----------------------------------------------------------------------  [place] MFM UA CORD DOPPLER  Result Date: 04/02/2020 ----------------------------------------------------------------------  OBSTETRICS REPORT                       (Signed Final 04/02/2020 04:00 pm) ---------------------------------------------------------------------- Patient Info  ID #:       [id number]                          D.O.B.:  1996-08-09 (24 yrs)  Name:       Stacy Warner                 Visit Date: 04/02/2020 03:33 pm ---------------------------------------------------------------------- Performed By  Attending:        Johnell Comings MD         Ref. Address:     [address] [place]                                                             [place], [place]  Performed By:     Georgie Chard,       [facility]                                     [facility]                                                             [facility]                                                             Women  Referred By:      Clenton Pare  HARPER MD ---------------------------------------------------------------------- Orders  #  Description                           Code        Ordered By  1  [place] MFM FETAL BPP WO NON               76819.01    [place]  2  [place] MFM UA CORD DOPPLER                56213.08    Sander Nephew ----------------------------------------------------------------------  #  Order #                     Accession #                Episode #  1  [id number]                   [id number]                 [id number]  2  [id number]                   [id number]                 [id number] ---------------------------------------------------------------------- Indications  Maternal care for known or suspected poor      O36.5930  fetal growth, third trimester, not applicable or  unspecified IUGR  Gestational diabetes in pregnancy, diet        O24.410  controlled  Elevated blood pressure affecting pregnancy    O13.3  in third trimester  [redacted] weeks gestation of pregnancy                Z3A.32  Fetal abnormality - other known or             O35.9XX0  suspected (EIF)  Asthma                                         O99.89 j45.909  Encounter for other antenatal screening        Z36.2  follow-up ---------------------------------------------------------------------- Fetal Evaluation  Num Of Fetuses:         1  Fetal Heart Rate(bpm):  143  Cardiac Activity:        Observed  Presentation:           Cephalic  Placenta:               Posterior  Amniotic Fluid  AFI FV:      Within normal limits  AFI Sum(cm)     %Tile       Largest Pocket(cm)  17.44           64  5.5  RUQ(cm)       RLQ(cm)       LUQ(cm)        LLQ(cm)  5.02          3.67          5.5            3.25 ---------------------------------------------------------------------- Biophysical Evaluation  Amniotic F.V:   Within normal limits       F. Tone:        Observed  F. Movement:    Observed                   Score:          8/8  F. Breathing:   Observed ---------------------------------------------------------------------- OB History  Gravidity:    2  TOP:          1        Living:  0 ---------------------------------------------------------------------- Gestational Age  LMP:           32w 5d        Date:  08/17/19                 EDD:   05/23/20  Best:          Milderd Meager 5d     Det. By:  LMP  (08/17/19)          EDD:   05/23/20 ---------------------------------------------------------------------- Doppler - Fetal Vessels  Umbilical Artery   S/D     %tile                                              ADFV    RDFV   4.43   > 97.5                                                 No      No ---------------------------------------------------------------------- Comments  This patient was seen for a biophysical profile due to  gestational diabetes and fetal growth restriction that was  noted during her prior ultrasound exams.  She denies any  problems since her last exam and reports feeling vigorous  fetal movements.  A biophysical profile performed today was 8 out of 8.  There was normal amniotic fluid noted on today's ultrasound  exam.  The umbilical artery Doppler studies continue to show an  elevated S/D ratio of 4.43.  There were no signs of absent or  reversed end-diastolic flow noted today.  She will return in 1 week for another biophysical profile and  umbilical artery Doppler study.  ----------------------------------------------------------------------                   Johnell Comings, MD Electronically Signed Final Report   04/02/2020 04:00 pm ----------------------------------------------------------------------  [place] MFM UA CORD DOPPLER  Result Date: 03/26/2020 ----------------------------------------------------------------------  OBSTETRICS REPORT                       (Signed Final 03/26/2020 04:59 pm) ---------------------------------------------------------------------- Patient Info  ID #:       [id number]                          D.O.B.:  [birth year] (24 yrs)  Name:       TEXAS OBORN                 Visit Date: 03/26/2020 04:35 pm ---------------------------------------------------------------------- Performed By  Attending:        Tama High MD        Ref. Address:     [address] [place]                                                             [place], [place]  Performed By:     Jeanene Erb BS,      [facility]                                     [facility]                                                             [facility]                                                             [facility]  Referred By:      Shelly Bombard MD ---------------------------------------------------------------------- Orders  #  Description                           Code        Ordered By  1  [place] MFM OB FOLLOW UP                   [phone]    Burton Apley  [place]  2  [place] MFM UA CORD DOPPLER                [id number]    Sander Nephew ----------------------------------------------------------------------  #  Order #                     Accession #                Episode #  1  [id number]                    [id number]                 [id number]  2  [id number]                   [id number]                 [id number] ---------------------------------------------------------------------- Indications  Gestational diabetes in pregnancy, diet        O24.410  controlled  Maternal care for known or suspected poor      O36.5930  fetal growth, third trimester, not applicable or  unspecified IUGR  [redacted] weeks gestation of pregnancy                Z3A.31  Elevated blood pressure affecting pregnancy    O13.3  in third trimester  Fetal abnormality - other known or             O35.9XX0  suspected (EIF)  Asthma                                         O99.89 j45.909  Encounter for other antenatal screening        Z36.2  follow-up ---------------------------------------------------------------------- Fetal Evaluation  Num Of Fetuses:         1  Fetal Heart Rate(bpm):  141  Cardiac Activity:       Observed  Presentation:           Cephalic  Placenta:               Posterior  P. Cord Insertion:      Previously Visualized  Amniotic Fluid  AFI FV:      Within normal limits  AFI Sum(cm)     %Tile       Largest Pocket(cm)  12.2            33          3.7  RUQ(cm)       RLQ(cm)       LUQ(cm)        LLQ(cm)  3.6           1.4           3.5            3.7 ---------------------------------------------------------------------- Biometry  BPD:      79.4  mm     G. Age:  31w 6d         45  %    CI:  77.55   %    70 - 86                                                          FL/HC:      20.7   %    19.1 - 21.3  HC:      285.4  mm     G. Age:  31w 2d          9  %    HC/AC:      1.05        0.96 - 1.17  AC:      272.7  mm     G. Age:  31w 2d         37  %    FL/BPD:     74.4   %    71 - 87  FL:       59.1  mm     G. Age:  30w 6d         16  %    FL/AC:      21.7   %    20 - 24  Est. FW:    1725  gm    3 lb 13 oz      24  % ---------------------------------------------------------------------- OB History  Gravidity:    2  TOP:          1         Living:  0 ---------------------------------------------------------------------- Gestational Age  LMP:           31w 5d        Date:  08/17/19                 EDD:   05/23/20  U/S Today:     31w 2d                                        EDD:   05/26/20  Best:          31w 5d     Det. By:  LMP  (08/17/19)          EDD:   05/23/20 ---------------------------------------------------------------------- Anatomy  Cranium:               Appears normal         LVOT:                   Previously seen  Cavum:                 Previously seen        Aortic Arch:            Previously seen  Ventricles:            Appears normal         Ductal Arch:            Previously seen  Choroid Plexus:        Previously seen        Diaphragm:              Appears normal  Cerebellum:  Previously seen        Stomach:                Appears normal, left                                                                        sided  Posterior Fossa:       Previously seen        Abdomen:                Appears normal  Nuchal Fold:           Not applicable (>52    Abdominal Wall:         Previously seen                         wks GA)  Face:                  Orbits and profile     Cord Vessels:           Previously seen                         previously seen  Lips:                  Previously seen        Kidneys:                Appear normal  Palate:                Not well visualized    Bladder:                Appears normal  Thoracic:              Appears normal         Spine:                  Previously seen  Heart:                 Echogenic focus        Upper Extremities:      Previously seen                         in LV prev  RVOT:                  Previously seen        Lower Extremities:      Previously seen  Other:  Female gender previously seen. 3VV and 3VTV previously visualized.          Nasal bone previously visualized. Open hands, Heels and 5th digit          previously visualized.  ---------------------------------------------------------------------- Doppler - Fetal Vessels  Umbilical Artery   S/D     %tile      RI    %tile      PI    %tile            ADFV    RDFV   4.33   > 97.5    0.77  97    1.26       95               No      No ---------------------------------------------------------------------- Cervix Uterus Adnexa  Cervix  Normal appearance by transabdominal scan. ---------------------------------------------------------------------- Impression  Patient with fetal growth restriction return to for fetal growth  assessment and antenatal testing.  On previous ultrasound  umbilical artery Doppler showed increased S/D ratio.  Her blood pressure today at our office were 132/97 mmHg  and 133/86 mm Hg. Patient does not have symptoms of  severe features of preeclampsia.  She has gestational diabetes that is well controlled on diet.  Labs drawn on 03/25/2020 showed normal platelet count and  normal liver enzymes.  Serum creatinine was 0.74.  On ultrasound, amniotic fluid is normal good fetal activity  seen.  Fetal growth is appropriate for gestational age.  The  estimated fetal weight is at the 24th percentile.  Umbilical  artery Doppler showed increased S/D ratio.  I explained the findings and reassured her of normal fetal  growth.  Abnormal umbilical artery Doppler in the presence of  normal fetal growth is in itself not an indication for weekly  antenatal testing.  However, because of fetal growth  restriction on previous ultrasound and that she has  gestational diabetes, we recommend weekly antenatal testing. ---------------------------------------------------------------------- Recommendations  -BPP and UA Doppler next week. ----------------------------------------------------------------------                  Tama High, MD Electronically Signed Final Report   03/26/2020 04:59 pm ----------------------------------------------------------------------   Assessment and Plan:    Pregnancy: G2P0010 at [redacted]w[redacted]d 1. Asymptomatic bacteriuria during pregnancy in first trimester Asymptomatic currently. Last urine culture 6/25 unremarkable.  2. Supervision of high risk pregnancy in third trimester -Doing well without complaints -Discussed patient will need to be scheduled for IOL, will schedule for 37 weeks, please discuss further at upcoming appt, as this was decided after patient left clinic and unable to contact patient -GBS at next visit  3. Mild intermittent asthma without complication No exacerbations at this time, not on daily controller, no albuterol usage.  4. Thrombocytosis (HCC) Platelets 7/31=409. Asymptomatic.   5. Iron deficiency anemia, unspecified iron deficiency anemia type Anemia currently resolved, last hgb 11.6  6. gHTN Currently prescribed labetalol 200 mg BID, compliant. Did not take this morning, BP elevated today. Will repeat preE labs. Discussed case with attending at [facility], Dr. Rosana Hoes, discussed that patients with gHTN should not be on med management given it could mask preE. Attempted to call patient x2 to have her discontinue labetalol, unable to reach. Please have her discontinue labetalol at next visit. Given patient's elevated BP readings on labetalol suspect she could have severe range BP.   7. Diet controlled gestational diabetes mellitus (GDM) in third trimester No BGL log today, reports FBGL 1 week ago was 85. Has made diet modifications. Encouraged to bring BGL log to next appt. BPP scheduled today.  8. Tobacco abuse/marijuana abuse No use since she found out she was pregnant at [redacted] weeks gestation.  Preterm labor symptoms and general obstetric precautions including but not limited to vaginal bleeding, contractions, leaking of fluid and fetal movement were reviewed in detail with the patient. I discussed the assessment and treatment plan with the patient. The patient was provided an opportunity to ask questions and all were answered. The  patient agreed with the plan and demonstrated an understanding of the instructions. The patient was advised  to call back or seek an in-person office evaluation/go to MAU at [facility] for any urgent or concerning symptoms. Please refer to After Visit Summary for other counseling recommendations.    No follow-ups on file.  Future Appointments  Date Time Provider [place]  04/19/2020  3:30 PM [facility] NURSE [facility]  04/19/2020  3:45 PM [facility] US1 [facility] [place], [place] for [employer], [place]

## 2020-04-20 LAB — COMPREHENSIVE METABOLIC PANEL
ALT: 10 IU/L (ref 0–32)
AST: 19 IU/L (ref 0–40)
Albumin/Globulin Ratio: 1.3 (ref 1.2–2.2)
Albumin: 3.2 g/dL — ABNORMAL LOW (ref 3.9–5.0)
Alkaline Phosphatase: 99 IU/L (ref 48–121)
BUN/Creatinine Ratio: 4 — ABNORMAL LOW (ref 9–23)
BUN: 3 mg/dL — ABNORMAL LOW (ref 6–20)
Bilirubin Total: <0.2 mg/dL (ref 0.0–1.2)
CO2: 22 mmol/L (ref 20–29)
Calcium: 8.9 mg/dL (ref 8.7–10.2)
Chloride: 104 mmol/L (ref 96–106)
Creatinine, Ser: 0.81 mg/dL (ref 0.57–1.00)
GFR calc Af Amer: 116 mL/min/{1.73_m2} (ref >59)
GFR calc non Af Amer: 101 mL/min/{1.73_m2} (ref >59)
Globulin, Total: 2.5 g/dL (ref 1.5–4.5)
Glucose: 67 mg/dL (ref 65–99)
Potassium: 4.3 mmol/L (ref 3.5–5.2)
Sodium: 136 mmol/L (ref 134–144)
Total Protein: 5.7 g/dL — ABNORMAL LOW (ref 6.0–8.5)

## 2020-04-20 LAB — CBC
Hematocrit: 35.1 % (ref 34.0–46.6)
Hemoglobin: 11.2 g/dL (ref 11.1–15.9)
MCH: 28.1 pg (ref 26.6–33.0)
MCHC: 31.9 g/dL (ref 31.5–35.7)
MCV: 88 fL (ref 79–97)
Platelets: 341 10*3/uL (ref 150–450)
RBC: 3.98 x10E6/uL (ref 3.77–5.28)
RDW: 13.4 % (ref 11.7–15.4)
WBC: 8 10*3/uL (ref 3.4–10.8)

## 2020-04-20 LAB — PROTEIN / CREATININE RATIO, URINE
Creatinine, Urine: 238.1 mg/dL
Protein, Ur: 100.5 mg/dL
Protein/Creat Ratio: 422 mg/g creat — ABNORMAL HIGH (ref 0–200)

## 2020-04-22 ENCOUNTER — Other Ambulatory Visit (HOSPITAL_COMMUNITY): Payer: Self-pay | Admitting: Advanced Practice Midwife

## 2020-04-22 ENCOUNTER — Other Ambulatory Visit: Payer: Self-pay | Admitting: *Deleted

## 2020-04-22 ENCOUNTER — Telehealth: Payer: Self-pay | Admitting: Family Medicine

## 2020-04-22 DIAGNOSIS — O365930 Maternal care for other known or suspected poor fetal growth, third trimester, not applicable or unspecified: Secondary | ICD-10-CM

## 2020-04-22 NOTE — Telephone Encounter (Signed)
Called patient to discuss that she should discontinue labetalol, as she should not be treated for gHTN. Given her elevated p/c and concern for preE, IOL has been scheduled. Discussed risks/benefits with patient and she voiced understanding. She will report tomorrow to clinic for BP check.

## 2020-04-23 ENCOUNTER — Other Ambulatory Visit: Payer: Self-pay

## 2020-04-23 ENCOUNTER — Inpatient Hospital Stay (HOSPITAL_COMMUNITY)
Admission: AD | Admit: 2020-04-23 | Discharge: 2020-04-26 | DRG: 807 | Disposition: A | Discharge status: Home / Self Care | Payer: Medicaid Other | Attending: Obstetrics and Gynecology | Admitting: Obstetrics and Gynecology

## 2020-04-23 ENCOUNTER — Ambulatory Visit (INDEPENDENT_AMBULATORY_CARE_PROVIDER_SITE_OTHER): Payer: Medicaid Other

## 2020-04-23 ENCOUNTER — Encounter (HOSPITAL_COMMUNITY): Payer: Self-pay | Admitting: Obstetrics and Gynecology

## 2020-04-23 VITALS — BP 147/99 | HR 67 | Ht 65.0 in | Wt 158.0 lb

## 2020-04-23 DIAGNOSIS — Z3A35 35 weeks gestation of pregnancy: Secondary | ICD-10-CM

## 2020-04-23 DIAGNOSIS — F1210 Cannabis abuse, uncomplicated: Secondary | ICD-10-CM | POA: Diagnosis present

## 2020-04-23 DIAGNOSIS — O24419 Gestational diabetes mellitus in pregnancy, unspecified control: Secondary | ICD-10-CM | POA: Diagnosis present

## 2020-04-23 DIAGNOSIS — Z87891 Personal history of nicotine dependence: Secondary | ICD-10-CM | POA: Diagnosis not present

## 2020-04-23 DIAGNOSIS — O365930 Maternal care for other known or suspected poor fetal growth, third trimester, not applicable or unspecified: Secondary | ICD-10-CM | POA: Diagnosis present

## 2020-04-23 DIAGNOSIS — O24420 Gestational diabetes mellitus in childbirth, diet controlled: Secondary | ICD-10-CM | POA: Diagnosis present

## 2020-04-23 DIAGNOSIS — O99891 Other specified diseases and conditions complicating pregnancy: Secondary | ICD-10-CM | POA: Diagnosis present

## 2020-04-23 DIAGNOSIS — O1410 Severe pre-eclampsia, unspecified trimester: Secondary | ICD-10-CM | POA: Diagnosis present

## 2020-04-23 DIAGNOSIS — J45909 Unspecified asthma, uncomplicated: Secondary | ICD-10-CM | POA: Diagnosis present

## 2020-04-23 DIAGNOSIS — O133 Gestational [pregnancy-induced] hypertension without significant proteinuria, third trimester: Secondary | ICD-10-CM

## 2020-04-23 DIAGNOSIS — Z20822 Contact with and (suspected) exposure to covid-19: Secondary | ICD-10-CM | POA: Diagnosis present

## 2020-04-23 DIAGNOSIS — O163 Unspecified maternal hypertension, third trimester: Secondary | ICD-10-CM

## 2020-04-23 DIAGNOSIS — O9952 Diseases of the respiratory system complicating childbirth: Secondary | ICD-10-CM | POA: Diagnosis present

## 2020-04-23 DIAGNOSIS — O9902 Anemia complicating childbirth: Secondary | ICD-10-CM | POA: Diagnosis present

## 2020-04-23 DIAGNOSIS — O2300 Infections of kidney in pregnancy, unspecified trimester: Secondary | ICD-10-CM | POA: Diagnosis present

## 2020-04-23 DIAGNOSIS — O1414 Severe pre-eclampsia complicating childbirth: Secondary | ICD-10-CM | POA: Diagnosis present

## 2020-04-23 DIAGNOSIS — R8271 Bacteriuria: Secondary | ICD-10-CM

## 2020-04-23 DIAGNOSIS — O0990 Supervision of high risk pregnancy, unspecified, unspecified trimester: Secondary | ICD-10-CM

## 2020-04-23 DIAGNOSIS — Z720 Tobacco use: Secondary | ICD-10-CM | POA: Diagnosis present

## 2020-04-23 DIAGNOSIS — O0993 Supervision of high risk pregnancy, unspecified, third trimester: Secondary | ICD-10-CM

## 2020-04-23 DIAGNOSIS — D509 Iron deficiency anemia, unspecified: Secondary | ICD-10-CM | POA: Diagnosis present

## 2020-04-23 DIAGNOSIS — Z23 Encounter for immunization: Secondary | ICD-10-CM | POA: Diagnosis not present

## 2020-04-23 DIAGNOSIS — D75839 Thrombocytosis, unspecified: Secondary | ICD-10-CM | POA: Diagnosis present

## 2020-04-23 LAB — CBC
HCT: 36.1 % (ref 36.0–46.0)
Hemoglobin: 11.6 g/dL — ABNORMAL LOW (ref 12.0–15.0)
MCH: 28.2 pg (ref 26.0–34.0)
MCHC: 32.1 g/dL (ref 30.0–36.0)
MCV: 87.8 fL (ref 80.0–100.0)
Platelets: 328 10*3/uL (ref 150–400)
RBC: 4.11 MIL/uL (ref 3.87–5.11)
RDW: 14.5 % (ref 11.5–15.5)
WBC: 8.1 10*3/uL (ref 4.0–10.5)
nRBC: 0.4 % — ABNORMAL HIGH (ref 0.0–0.2)

## 2020-04-23 LAB — COMPREHENSIVE METABOLIC PANEL
ALT: 12 U/L (ref 0–44)
AST: 22 U/L (ref 15–41)
Albumin: 2.5 g/dL — ABNORMAL LOW (ref 3.5–5.0)
Alkaline Phosphatase: 82 U/L (ref 38–126)
Anion gap: 9 (ref 5–15)
BUN: <5 mg/dL — ABNORMAL LOW (ref 6–20)
CO2: 19 mmol/L — ABNORMAL LOW (ref 22–32)
Calcium: 8.9 mg/dL (ref 8.9–10.3)
Chloride: 107 mmol/L (ref 98–111)
Creatinine, Ser: 0.86 mg/dL (ref 0.44–1.00)
GFR calc Af Amer: >60 mL/min (ref >60)
GFR calc non Af Amer: >60 mL/min (ref >60)
Glucose, Bld: 81 mg/dL (ref 70–99)
Potassium: 3.6 mmol/L (ref 3.5–5.1)
Sodium: 135 mmol/L (ref 135–145)
Total Bilirubin: 0.5 mg/dL (ref 0.3–1.2)
Total Protein: 6.1 g/dL — ABNORMAL LOW (ref 6.5–8.1)

## 2020-04-23 LAB — PROTEIN / CREATININE RATIO, URINE
Creatinine, Urine: 150.56 mg/dL
Protein Creatinine Ratio: 1.65 mg/mg{Cre} — ABNORMAL HIGH (ref 0.00–0.15)
Total Protein, Urine: 249 mg/dL

## 2020-04-23 LAB — URINALYSIS, ROUTINE W REFLEX MICROSCOPIC
Bilirubin Urine: NEGATIVE
Glucose, UA: NEGATIVE mg/dL
Hgb urine dipstick: NEGATIVE
Ketones, ur: NEGATIVE mg/dL
Nitrite: NEGATIVE
Protein, ur: 100 mg/dL — AB
Specific Gravity, Urine: 1.011 (ref 1.005–1.030)
pH: 7 (ref 5.0–8.0)

## 2020-04-23 LAB — SARS CORONAVIRUS 2 BY RT PCR (HOSPITAL ORDER, PERFORMED IN ~~LOC~~ HOSPITAL LAB): SARS Coronavirus 2: NEGATIVE

## 2020-04-23 LAB — GROUP B STREP BY PCR: Group B strep by PCR: NEGATIVE

## 2020-04-23 LAB — GLUCOSE, CAPILLARY
Glucose-Capillary: 87 mg/dL (ref 70–99)
Glucose-Capillary: 92 mg/dL (ref 70–99)
Glucose-Capillary: 139 mg/dL — ABNORMAL HIGH (ref 70–99)

## 2020-04-23 LAB — TYPE AND SCREEN
ABO/RH(D): B POS
Antibody Screen: NEGATIVE

## 2020-04-23 MED ORDER — LACTATED RINGERS IV SOLN: INTRAVENOUS | Status: DC

## 2020-04-23 MED ORDER — LABETALOL HCL 5 MG/ML IV SOLN: 80.0000 mg | INTRAVENOUS | Status: DC | PRN

## 2020-04-23 MED ORDER — PHENYLEPHRINE 40 MCG/ML (10ML) SYRINGE FOR IV PUSH (FOR BLOOD PRESSURE SUPPORT)
80.0000 ug | PREFILLED_SYRINGE | INTRAVENOUS | Status: DC | PRN
  Filled 2020-04-23: qty 10

## 2020-04-23 MED ORDER — LABETALOL HCL 5 MG/ML IV SOLN: 40.0000 mg | INTRAVENOUS | Status: DC | PRN

## 2020-04-23 MED ORDER — LABETALOL HCL 5 MG/ML IV SOLN: 20.0000 mg | INTRAVENOUS | Status: DC | PRN

## 2020-04-23 MED ORDER — ONDANSETRON HCL 4 MG/2ML IJ SOLN
4.0000 mg | Freq: Four times a day (QID) | INTRAMUSCULAR | Status: DC | PRN
  Administered 2020-04-23 (×2): 4 mg via INTRAVENOUS
  Filled 2020-04-23 (×2): qty 2

## 2020-04-23 MED ORDER — LIDOCAINE HCL (PF) 1 % IJ SOLN: 30.0000 mL | INTRAMUSCULAR | Status: DC | PRN

## 2020-04-23 MED ORDER — OXYTOCIN BOLUS FROM INFUSION
333.0000 mL | Freq: Once | INTRAVENOUS | Status: AC
  Administered 2020-04-24: 333 mL via INTRAVENOUS

## 2020-04-23 MED ORDER — BETAMETHASONE SOD PHOS & ACET 6 (3-3) MG/ML IJ SUSP
12.0000 mg | INTRAMUSCULAR | Status: DC
  Administered 2020-04-23: 12 mg via INTRAMUSCULAR
  Filled 2020-04-23: qty 5

## 2020-04-23 MED ORDER — TERBUTALINE SULFATE 1 MG/ML IJ SOLN: 0.2500 mg | Freq: Once | INTRAMUSCULAR | Status: DC | PRN

## 2020-04-23 MED ORDER — MAGNESIUM SULFATE 40 GM/1000ML IV SOLN
2.0000 g/h | INTRAVENOUS | Status: DC
  Administered 2020-04-24: 2 g/h via INTRAVENOUS
  Filled 2020-04-23 (×2): qty 1000

## 2020-04-23 MED ORDER — SODIUM CHLORIDE 0.9 % IV SOLN
5.0000 10*6.[IU] | Freq: Once | INTRAVENOUS | Status: AC
  Administered 2020-04-23: 5 10*6.[IU] via INTRAVENOUS
  Filled 2020-04-23: qty 5

## 2020-04-23 MED ORDER — OXYCODONE-ACETAMINOPHEN 5-325 MG PO TABS: 2.0000 | ORAL_TABLET | ORAL | Status: DC | PRN

## 2020-04-23 MED ORDER — FENTANYL CITRATE (PF) 100 MCG/2ML IJ SOLN
50.0000 ug | INTRAMUSCULAR | Status: DC | PRN
  Administered 2020-04-24 (×5): 100 ug via INTRAVENOUS
  Filled 2020-04-23 (×5): qty 2

## 2020-04-23 MED ORDER — NIFEDIPINE 10 MG PO CAPS: 20.0000 mg | ORAL_CAPSULE | ORAL | Status: DC | PRN

## 2020-04-23 MED ORDER — MISOPROSTOL 50MCG HALF TABLET: 50.0000 ug | ORAL_TABLET | ORAL | Status: DC | PRN

## 2020-04-23 MED ORDER — ZOLPIDEM TARTRATE 5 MG PO TABS: 5.0000 mg | ORAL_TABLET | Freq: Every evening | ORAL | Status: DC | PRN

## 2020-04-23 MED ORDER — DIPHENHYDRAMINE HCL 50 MG/ML IJ SOLN: 12.5000 mg | INTRAMUSCULAR | Status: DC | PRN

## 2020-04-23 MED ORDER — EPHEDRINE 5 MG/ML INJ: 10.0000 mg | INTRAVENOUS | Status: DC | PRN

## 2020-04-23 MED ORDER — FENTANYL-BUPIVACAINE-NACL 0.5-0.125-0.9 MG/250ML-% EP SOLN
12.0000 mL/h | EPIDURAL | Status: DC | PRN
  Filled 2020-04-23: qty 250

## 2020-04-23 MED ORDER — OXYTOCIN-SODIUM CHLORIDE 30-0.9 UT/500ML-% IV SOLN: 2.5000 [IU]/h | INTRAVENOUS | Status: DC

## 2020-04-23 MED ORDER — MISOPROSTOL 25 MCG QUARTER TABLET
25.0000 ug | ORAL_TABLET | ORAL | Status: DC | PRN
  Filled 2020-04-23: qty 1

## 2020-04-23 MED ORDER — PENICILLIN G POT IN DEXTROSE 60000 UNIT/ML IV SOLN
3.0000 10*6.[IU] | INTRAVENOUS | Status: DC
  Administered 2020-04-23 – 2020-04-24 (×5): 3 10*6.[IU] via INTRAVENOUS
  Filled 2020-04-23 (×5): qty 50

## 2020-04-23 MED ORDER — NIFEDIPINE 10 MG PO CAPS: 10.0000 mg | ORAL_CAPSULE | ORAL | Status: DC | PRN

## 2020-04-23 MED ORDER — FLEET ENEMA 7-19 GM/118ML RE ENEM: 1.0000 | ENEMA | RECTAL | Status: DC | PRN

## 2020-04-23 MED ORDER — HYDROXYZINE HCL 50 MG PO TABS: 50.0000 mg | ORAL_TABLET | Freq: Four times a day (QID) | ORAL | Status: DC | PRN

## 2020-04-23 MED ORDER — PHENYLEPHRINE 40 MCG/ML (10ML) SYRINGE FOR IV PUSH (FOR BLOOD PRESSURE SUPPORT): 80.0000 ug | PREFILLED_SYRINGE | INTRAVENOUS | Status: DC | PRN

## 2020-04-23 MED ORDER — LACTATED RINGERS IV SOLN: 500.0000 mL | Freq: Once | INTRAVENOUS | Status: DC

## 2020-04-23 MED ORDER — ACETAMINOPHEN 325 MG PO TABS
650.0000 mg | ORAL_TABLET | ORAL | Status: DC | PRN
  Administered 2020-04-23: 650 mg via ORAL
  Filled 2020-04-23: qty 2

## 2020-04-23 MED ORDER — MISOPROSTOL 50MCG HALF TABLET
ORAL_TABLET | ORAL | Status: AC
  Administered 2020-04-23: 50 ug
  Filled 2020-04-23: qty 1

## 2020-04-23 MED ORDER — OXYTOCIN-SODIUM CHLORIDE 30-0.9 UT/500ML-% IV SOLN
1.0000 m[IU]/min | INTRAVENOUS | Status: DC
  Administered 2020-04-23: 2 m[IU]/min via INTRAVENOUS
  Filled 2020-04-23: qty 500

## 2020-04-23 MED ORDER — SOD CITRATE-CITRIC ACID 500-334 MG/5ML PO SOLN
30.0000 mL | ORAL | Status: DC | PRN
  Administered 2020-04-24: 30 mL via ORAL
  Filled 2020-04-23: qty 30

## 2020-04-23 MED ORDER — HYDRALAZINE HCL 20 MG/ML IJ SOLN: 10.0000 mg | INTRAMUSCULAR | Status: DC | PRN

## 2020-04-23 MED ORDER — LACTATED RINGERS IV SOLN: 500.0000 mL | INTRAVENOUS | Status: DC | PRN

## 2020-04-23 MED ORDER — LABETALOL HCL 5 MG/ML IV SOLN
20.0000 mg | INTRAVENOUS | Status: DC | PRN
  Administered 2020-04-23: 20 mg via INTRAVENOUS
  Filled 2020-04-23: qty 4

## 2020-04-23 MED ORDER — MAGNESIUM SULFATE BOLUS VIA INFUSION
4.0000 g | Freq: Once | INTRAVENOUS | Status: AC
  Administered 2020-04-23: 4 g via INTRAVENOUS
  Filled 2020-04-23: qty 1000

## 2020-04-23 MED ORDER — OXYCODONE-ACETAMINOPHEN 5-325 MG PO TABS: 1.0000 | ORAL_TABLET | ORAL | Status: DC | PRN

## 2020-04-23 NOTE — Progress Notes (Addendum)
Subjective:  Stacy Warner is a 24 y.o. female [redacted]w[redacted]d here for BP check.   Hypertension ROS: no TIA's, no chest pain on exertion, no dyspnea on exertion and no swelling of ankles.    Objective:  BP (!) 147/99 (BP Location: Left Arm, Cuff Size: Large)   Pulse 67   Ht 5\' 5"  (1.651 m)   Wt 158 lb (71.7 kg)   LMP 08/17/2019   BMI 26.29 kg/m  BP Right Arm 155/108 P 75 Appearance alert, well appearing, and in no distress. General exam BP noted to be well controlled today in office.    Assessment:   Elevated Blood Pressure asymptomatic.    Plan:  Patient was sent to MAU for elvauation and monitoring per Dr. Lake Bells.

## 2020-04-23 NOTE — Progress Notes (Signed)
Labor Progress Note Stacy Warner is a 24 y.o. G2P0010 at [redacted]w[redacted]d presented for IOL for gHTN with superimposed severe preE.  S: Denies HA, vision changes and RUQ pain. No significantly increased contractions. Pain well controled for now.    O:  BP 133/90    Pulse 70    Temp 97.8 F (36.6 C) (Oral)    Resp 16    Ht 5\' 5"  (1.651 m)    Wt 71.7 kg    LMP 08/17/2019    SpO2 100% Comment: room air   BMI 26.30 kg/m  EFM: 125/moderate var/accels, no decels  CVE: Dilation: Fingertip Effacement (%): 20 Cervical Position: Posterior Station: -2 Presentation: Vertex Exam by:: Dr. Sylvester Harder   A&P: 24 y.o. G2P0010 [redacted]w[redacted]d here for  IOL for gHTN with superimposed severe preE.  #IOL: FB still in place.  Will continue with cytotec and FB until FB falls out.   #gHTN w/ preeclampsia w/ severe features: last severe range BP at 1230, labetalol given.  Continue to monitor. #Pain: IV PRN for now. #FWB: Cat I #GBS Unknown, preterm, PCN #GDM: CBGs WNL. Continue Q4 checks in latent labor Q2 in active. #Birth plan: would like to delay cord clamping as long as possible and keep the placenta.  [name], MD 7:06 PM

## 2020-04-23 NOTE — H&P (Addendum)
OBSTETRIC ADMISSION HISTORY AND PHYSICAL  Stacy Warner is a 24 y.o. female G2P0010 with IUP at 69w5dby LMP presenting for preeclampsia with severe features. She was seen for a BP check in clinic today and was found to have severe range BP at that time so she was sent to MAU for further evaluation. She reports +FMs, No LOF, no VB, no blurry vision, headaches or peripheral edema, and RUQ pain.  She plans on breast feeding. She request depo for birth control. [facility]  Dating: By LMP --->  Estimated Date of Delivery: 05/23/20  Sono:  '@[redacted]w[redacted]d' , normal anatomy, cephalic presentation, 21914N 3.4% EFW  Prenatal History/Complications: GMDA1 IUGR gHTN with preE w/ SF Asthma (not on inhalers) Pyelonephritis 5/16 Tobacco and marijuana use (1st trimester, prior to UPT)  Past Medical History: Past Medical History:  Diagnosis Date  . Anemia   . Asthma   . Chlamydia    age 24 . Infection    UTI    Past Surgical History: Past Surgical History:  Procedure Laterality Date  . TONSILLECTOMY      Obstetrical History: OB History    Gravida  2   Para      Term      Preterm      AB  1   Living  0     SAB      TAB  1   Ectopic      Multiple      Live Births              Social History: Social History   Socioeconomic History  . Marital status: Single    Spouse name: Not on file  . Number of children: Not on file  . Years of education: Not on file  . Highest education level: Not on file  Occupational History  . Not on file  Tobacco Use  . Smoking status: Former Smoker    Packs/day: 0.25    Years: 1.00    Pack years: 0.25    Types: Cigars  . Smokeless tobacco: Never Used  . Tobacco comment: black and mild  Vaping Use  . Vaping Use: Never used  Substance and Sexual Activity  . Alcohol use: Not Currently    Comment: occasional, not since confirmed pregnancy  . Drug use: Not Currently    Types: Marijuana    Comment:  not sinc e1st trimester  . Sexual activity: Yes    Partners: Male    Birth control/protection: None  Other Topics Concern  . Not on file  Social History Narrative  . Not on file   Social Determinants of Health   Financial Resource Strain:   . Difficulty of Paying Living Expenses: Not on file  Food Insecurity:   . Worried About RCharity fundraiserin the Last Year: Not on file  . Ran Out of Food in the Last Year: Not on file  Transportation Needs:   . Lack of Transportation (Medical): Not on file  . Lack of Transportation (Non-Medical): Not on file  Physical Activity:   . Days of Exercise per Week: Not on file  . Minutes of Exercise per Session: Not on file  Stress:   . Feeling of Stress : Not on file  Social Connections:   . Frequency of Communication with Friends and Family: Not on file  . Frequency of Social Gatherings with Friends and Family: Not on file  . Attends Religious Services:  Not on file  . Active Member of Clubs or Organizations: Not on file  . Attends Archivist Meetings: Not on file  . Marital Status: Not on file    Family History: Family History  Problem Relation Age of Onset  . Heart disease Paternal Grandmother   . Stroke Paternal Grandmother   . Thyroid disease Paternal Grandfather     Allergies: No Known Allergies  Medications Prior to Admission  Medication Sig Dispense Refill Last Dose  . famotidine (PEPCID) 20 MG tablet Take 1 tablet (20 mg total) by mouth 2 (two) times daily. 60 tablet 1 04/22/2020 at Unknown time  . Accu-Chek Softclix Lancets lancets Use as instructed 100 each 12   . amoxicillin (AMOXIL) 500 MG tablet Take 500 mg by mouth 2 (two) times daily. (Patient not taking: Reported on 04/23/2020)     . Blood Glucose Monitoring Suppl (ACCU-CHEK GUIDE) w/Device KIT 1 kit by Does not apply route daily. 1 kit 0   . Blood Pressure Monitor KIT 1 each by Does not apply route daily. 1 kit 0   . butalbital-acetaminophen-caffeine  (FIORICET) 50-325-40 MG tablet Take 1-2 tablets by mouth every 6 (six) hours as needed for headache. 20 tablet 0   . Elastic Bandages & Supports (COMFORT FIT MATERNITY SUPP SM) MISC Wear as directed. 1 each 0   . glucose blood (ACCU-CHEK GUIDE) test strip Use as instructed 100 each 12   . loratadine (CLARITIN) 10 MG tablet Take 1 tablet (10 mg total) by mouth daily. 30 tablet 11   . ondansetron (ZOFRAN) 4 MG tablet Take 1 tablet (4 mg total) by mouth every 8 (eight) hours as needed for nausea or vomiting. 20 tablet 2   . Prenatal Vit-Fe Phos-FA-Omega (VITAFOL GUMMIES) 3.33-0.333-34.8 MG CHEW Chew 3 each by mouth daily. 90 tablet 11      Review of Systems   All systems reviewed and negative except as stated in HPI  Blood pressure (!) 162/109, pulse 64, temperature 98.2 F (36.8 C), temperature source Oral, resp. rate 16, last menstrual period 08/17/2019, SpO2 100 %. General appearance: alert, cooperative and appears stated age Lungs: normal effort Heart: regular rate and rhythm Abdomen: soft, non-tender; gravid Pelvic: adequate Extremities: no sign of DVT Presentation: cephalic by bedside US Fetal monitoringBaseline: 135 bpm, Variability: Good {> 6 bpm), Accelerations: Reactive and Decelerations: Absent Uterine activityNone    Prenatal labs: ABO, Rh: --/--/B POS, B POS Performed at [facility], [place] [address], [place], [place] [address]  [phone]) Antibody: NEG (05/16 1339) Rubella: 1.03 (03/05 0933) RPR: Non Reactive (07/02 0829)  HBsAg: Negative (03/05 0933)  HIV: Non Reactive (07/02 0829)  GBS:   unknown 2 hr Glucola abnormal (FBGL 95) Genetic screening  Low risk Anatomy US Normal, IUGR (10% on 28 wk scan)  Prenatal Transfer Tool  Maternal Diabetes: Yes:  Diabetes Type:  Diet controlled Genetic Screening: Normal Maternal Ultrasounds/Referrals: IUGR Fetal Ultrasounds or other Referrals:  None Maternal Substance Abuse:  Yes:  Type: Marijuana, prior to finding  out she was pregnant Significant Maternal Medications:  None Significant Maternal Lab Results: None  No results found for this or any previous visit (from the past 24 hour(s)).  Patient Active Problem List   Diagnosis Date Noted  . Preeclampsia 04/23/2020  . Gestational hypertension w/o significant proteinuria in 3rd trimester 03/30/2020  . Gestational diabetes mellitus (GDM) in third trimester 03/05/2020  . Pyelonephritis affecting pregnancy 01/14/2020  . Asymptomatic bacteriuria during pregnancy in first  trimester 11/09/2019  . Asthma   . Supervision of high-risk pregnancy 10/30/2019  . Tobacco abuse 04/23/2016  . Marijuana abuse 04/23/2016  . Iron deficiency anemia 04/18/2016  . Thrombocytosis ([place]) 04/18/2016    Assessment/Plan:  JAMIYA NIMS is a 24 y.o. G2P0010 at 34w5dhere for IOL for gHTN with superimposed preeclampsia with severe features.  #Labor: Given severe range BP today, will plan for IOL for gHTN with superimposed preE w/ SF. Plan to begin induction with cytotec.  Will attempt FB once mother of pt has arrived. #Pain: IV PRN for now. Epidural eventually. #FWB: Cat I #ID:  GBS unknown, pending, PCN #MOF: Both #MOC:Depo #Circ:  N/A #gHTN with superimposed Preeclampsia with severe features (BP): no HA or vision changes, BP protocol in place. Starting IV magnesium. PIH labs pending. #GDMA1: check CBGs Q 4 hours in latent labor and Q 2 hours in active. #pre-term: Begin betamethasone series, do not delay delivery for second dose.  PMatilde Haymaker MD  04/23/2020, 12:47 PM   GME ATTESTATION:  I saw and evaluated the patient. I agree with the findings and the plan of care as documented in the resident's note.  AArrie Senate MD OB Fellow, [place]8/24/2021 2:41 PM

## 2020-04-23 NOTE — Progress Notes (Signed)
Patient ID: [name], female   DOB: April 02, 1996, 24 y.o.   MRN: [id number] UBAH RADKE is a 24 y.o. G2P0010 at [redacted]w[redacted]d admitted for induction of labor due to pre-e w/ severe features, FGR 3.4%, A1DM.  Subjective: Comfortable, not feeling uc's. Denies ha, visual changes, ruq/epigastric pain, n/v.    Objective: BP (!) 139/97 (BP Location: Left Arm)   Pulse 87   Temp 97.9 F (36.6 C) (Oral)   Resp 17   Ht 5\' 5"  (1.651 m)   Wt 71.7 kg   LMP 08/17/2019   SpO2 100% Comment: room air  BMI 26.30 kg/m  Total I/O In: 175.1 [I.V.:175.1] Out: -   FHT:  FHR: 125 bpm, variability: moderate,  accelerations:  Present,  decelerations:  Absent UC:   q 2-62mins  SVE:   Dilation: 4 Effacement (%): 50 Station: -2 Exam by:: Mary Martinique Johnson, RN   Pitocin @ 2 mu/min  Labs: Lab Results  Component Value Date   WBC 8.1 04/23/2020   HGB 11.6 (L) 04/23/2020   HCT 36.1 04/23/2020   MCV 87.8 04/23/2020   PLT 328 04/23/2020   CBG (last 3)  Recent Labs    04/23/20 1323 04/23/20 1735  GLUCAP 87 92     Assessment / Plan: IOL d/t pre-e w/ severe features, FGR, A1DM, s/p cytotec x 1 and foley bulb that came out at [place]. Pit started @ 1945, plan SVE 2330 or sooner if indicated. BP's stable, asymptomatic. Wants to keep placenta to encapsulate. Discussed protocol for sending placenta to pathology for pre-e and FGR, pt is ok w/ this.   Labor: s/p ripening Fetal Wellbeing:  Category I Pain Control:  n/a Pre-eclampsia: bp's stable, asymptomatic, on mag I/D:  GBS pcr neg, cx pending- will continue PCN until cx returns d/t prematurity Anticipated MOD:  NSVD  Roma Schanz CNM, WHNP-BC 04/23/2020, 8:46 PM

## 2020-04-23 NOTE — MAU Note (Signed)
Stacy Warner is a 24 y.o. at [redacted]w[redacted]d here in MAU reporting: went to the office for a BP check and it was elevated so they sent her in. Denies headache, visual changes, or RUQ pain. +FM  Onset of complaint: today  Pain score: 0/10  Vitals:   04/23/20 1153  BP: (!) 160/105  Pulse: 66  Resp: 16  Temp: 98.2 F (36.8 C)  SpO2: 100%     FHT: +FM  Lab orders placed from triage: UA

## 2020-04-23 NOTE — Progress Notes (Signed)
Labor Progress Note KATY BRICKELL is a 24 y.o. G2P0010 at [redacted]w[redacted]d presented from clinic for IOL in the setting of gHTN with superimposed preE w/ SF (severe range BP).  S: Doing well without complaints.  O:  BP (!) 132/104   Pulse 82   Temp 97.8 F (36.6 C) (Oral)   Resp 16   Ht 5\' 5"  (1.651 m)   Wt 71.7 kg   LMP 08/17/2019   SpO2 100% Comment: room air  BMI 26.30 kg/m  EFM: baseline 120bpm/mod variability/+ accels/no decels Toco: irritable   CVE: Dilation: 1 Effacement (%): 30 Station: -2 Presentation: Vertex (confirmed by bedside u/s) Exam by:: dr Sylvester Harder   A&P: 24 y.o. G2P0010 [redacted]w[redacted]d here for IOL for gHTN with superimposed preeclampsia with severe features.  #IOL: Received cytotec x1. FB risks/benefits discussed with patient, patient amendable to FB placement, performed without difficulty. Will continue to augment as appropriate. #Pain: PRN, CSE when desired #FWB: Cat I #ID: GBS unknown, pending, PCN #MOF: Both #MOC:Depo #Circ:   N/A #gHTN with superimposed Preeclampsia with severe features (BP): no HA or vision changes, BP protocol in place, has required labetalol 20 mg IV since admission. [place] labs with p/c elevated from prior. Platelets and LFTs unremarkable. Will continue to monitor BP. #GDMA1: check CBGs Q 4 hours in latent labor and Q 2 hours in active. #pre-term: completed BMZx1. Second dose 8/25 @1300 . Will not delay delivery.  Arrie Senate, MD 4:16 PM

## 2020-04-24 ENCOUNTER — Inpatient Hospital Stay (HOSPITAL_COMMUNITY): Payer: Medicaid Other | Admitting: Anesthesiology

## 2020-04-24 ENCOUNTER — Encounter (HOSPITAL_COMMUNITY): Payer: Self-pay | Admitting: Family Medicine

## 2020-04-24 DIAGNOSIS — Z3A35 35 weeks gestation of pregnancy: Secondary | ICD-10-CM

## 2020-04-24 DIAGNOSIS — O1414 Severe pre-eclampsia complicating childbirth: Secondary | ICD-10-CM

## 2020-04-24 LAB — CBC
HCT: 36.8 % (ref 36.0–46.0)
Hemoglobin: 11.9 g/dL — ABNORMAL LOW (ref 12.0–15.0)
MCH: 28.1 pg (ref 26.0–34.0)
MCHC: 32.3 g/dL (ref 30.0–36.0)
MCV: 87 fL (ref 80.0–100.0)
Platelets: 388 10*3/uL (ref 150–400)
RBC: 4.23 MIL/uL (ref 3.87–5.11)
RDW: 14.5 % (ref 11.5–15.5)
WBC: 15.1 10*3/uL — ABNORMAL HIGH (ref 4.0–10.5)
nRBC: 0.3 % — ABNORMAL HIGH (ref 0.0–0.2)

## 2020-04-24 LAB — GLUCOSE, CAPILLARY
Glucose-Capillary: 100 mg/dL — ABNORMAL HIGH (ref 70–99)
Glucose-Capillary: 113 mg/dL — ABNORMAL HIGH (ref 70–99)
Glucose-Capillary: 118 mg/dL — ABNORMAL HIGH (ref 70–99)

## 2020-04-24 LAB — RPR: RPR Ser Ql: NONREACTIVE

## 2020-04-24 MED ORDER — MAGNESIUM SULFATE 40 GM/1000ML IV SOLN
2.0000 g/h | INTRAVENOUS | Status: AC
  Administered 2020-04-25: 2 g/h via INTRAVENOUS
  Filled 2020-04-24: qty 1000

## 2020-04-24 MED ORDER — LACTATED RINGERS IV SOLN: INTRAVENOUS | Status: DC

## 2020-04-24 MED ORDER — ZOLPIDEM TARTRATE 5 MG PO TABS: 5.0000 mg | ORAL_TABLET | Freq: Every evening | ORAL | Status: DC | PRN

## 2020-04-24 MED ORDER — BENZOCAINE-MENTHOL 20-0.5 % EX AERO
1.0000 "application " | INHALATION_SPRAY | CUTANEOUS | Status: DC | PRN
  Administered 2020-04-24: 1 via TOPICAL
  Filled 2020-04-24: qty 56

## 2020-04-24 MED ORDER — SIMETHICONE 80 MG PO CHEW: 80.0000 mg | CHEWABLE_TABLET | ORAL | Status: DC | PRN

## 2020-04-24 MED ORDER — TRANEXAMIC ACID-NACL 1000-0.7 MG/100ML-% IV SOLN
1000.0000 mg | INTRAVENOUS | Status: AC
  Administered 2020-04-24: 1000 mg via INTRAVENOUS

## 2020-04-24 MED ORDER — ACETAMINOPHEN 325 MG PO TABS: 650.0000 mg | ORAL_TABLET | ORAL | Status: DC | PRN

## 2020-04-24 MED ORDER — IBUPROFEN 600 MG PO TABS
600.0000 mg | ORAL_TABLET | Freq: Four times a day (QID) | ORAL | Status: DC
  Administered 2020-04-24 – 2020-04-26 (×7): 600 mg via ORAL
  Filled 2020-04-24 (×7): qty 1

## 2020-04-24 MED ORDER — DIPHENHYDRAMINE HCL 25 MG PO CAPS: 25.0000 mg | ORAL_CAPSULE | Freq: Four times a day (QID) | ORAL | Status: DC | PRN

## 2020-04-24 MED ORDER — TRANEXAMIC ACID-NACL 1000-0.7 MG/100ML-% IV SOLN
INTRAVENOUS | Status: AC
  Filled 2020-04-24: qty 100

## 2020-04-24 MED ORDER — COCONUT OIL OIL: 1.0000 "application " | TOPICAL_OIL | Status: DC | PRN

## 2020-04-24 MED ORDER — WITCH HAZEL-GLYCERIN EX PADS: 1.0000 "application " | MEDICATED_PAD | CUTANEOUS | Status: DC | PRN

## 2020-04-24 MED ORDER — LIDOCAINE HCL (PF) 1 % IJ SOLN
INTRAMUSCULAR | Status: DC | PRN
  Administered 2020-04-24: 8 mL via EPIDURAL

## 2020-04-24 MED ORDER — ONDANSETRON HCL 4 MG PO TABS: 4.0000 mg | ORAL_TABLET | ORAL | Status: DC | PRN

## 2020-04-24 MED ORDER — ONDANSETRON HCL 4 MG/2ML IJ SOLN: 4.0000 mg | INTRAMUSCULAR | Status: DC | PRN

## 2020-04-24 MED ORDER — SENNOSIDES-DOCUSATE SODIUM 8.6-50 MG PO TABS
2.0000 | ORAL_TABLET | ORAL | Status: DC
  Administered 2020-04-24 – 2020-04-25 (×2): 2 via ORAL
  Filled 2020-04-24 (×2): qty 2

## 2020-04-24 MED ORDER — BUPIVACAINE HCL (PF) 0.75 % IJ SOLN
INTRAMUSCULAR | Status: DC | PRN
Start: 2020-04-24 — End: 2020-04-24
  Administered 2020-04-24: 12 mL/h via EPIDURAL

## 2020-04-24 MED ORDER — PRENATAL MULTIVITAMIN CH
1.0000 | ORAL_TABLET | Freq: Every day | ORAL | Status: DC
  Administered 2020-04-25: 1 via ORAL
  Filled 2020-04-24: qty 1

## 2020-04-24 MED ORDER — TETANUS-DIPHTH-ACELL PERTUSSIS 5-2.5-18.5 LF-MCG/0.5 IM SUSP
0.5000 mL | Freq: Once | INTRAMUSCULAR | Status: AC
  Administered 2020-04-26: 0.5 mL via INTRAMUSCULAR
  Filled 2020-04-24: qty 0.5

## 2020-04-24 MED ORDER — DIBUCAINE (PERIANAL) 1 % EX OINT: 1.0000 "application " | TOPICAL_OINTMENT | CUTANEOUS | Status: DC | PRN

## 2020-04-24 NOTE — Progress Notes (Signed)
Labor Progress Note Stacy Warner is a 24 y.o. G2P0010 at [redacted]w[redacted]d presented for IOL for preE w/ severe features.  S: Just fell asleep.  Epidural in place.  No new concerns from family or RN.   O:  BP (!) 101/51   Pulse 66   Temp 98 F (36.7 C) (Oral)   Resp 18   Ht 5\' 5"  (1.651 m)   Wt 71.7 kg   LMP 08/17/2019   SpO2 94%   BMI 26.30 kg/m  EFM: 110/low-moderate var/no accels, no decels. Some difficulty tracing fetal HR at times.  CVE: Dilation: 5.5 Effacement (%): 70, 80 Cervical Position: Posterior Station: 0 Presentation: Vertex Exam by:: [name], RN   A&P: 24 y.o. G2P0010 [redacted]w[redacted]d IOL for gHTN w/ superimposed preE w/ severe features.  #Labor: Progressing well. Sleeping for now. Continue Pit.  Plan to let rest and AROM and place FSE once awake.   #preE w/ severe features: systolic pressures 347-425 in the past 8 hours. Diastolic pressures 95-638.  No labetalol given in last 4 hours. Will continue to monitor. #Pain: epidural in place. #FWB: Cat II due to periods of decreased variability.  Likely secondary to mag txt.  Occasional periods of improved variability.  Overall reassuring.  Will continue to monitor. #GBS unknown, preterm, on PCN  #GDMA1: CBGs around 100, only one reading above 120 since adimssion.  Q4 hours in latent labor Q 2 hours in active. No insuline at this time.  [name], MD 9:30 AM

## 2020-04-24 NOTE — Progress Notes (Signed)
Labor Progress Note Stacy Warner is a 24 y.o. G2P0010 at [redacted]w[redacted]d presented for IOL for pre-eclampsia with severe range blood pressures, fetal growth restriction 3.4%, A1DM S: Patient feeling contractions more. Feeling slightly more uncomfortable.  O:  BP 127/78   Pulse 82   Temp 97.9 F (36.6 C) (Oral)   Resp 16   Ht 5\' 5"  (1.651 m)   Wt 71.7 kg   LMP 08/17/2019   SpO2 100% Comment: room air  BMI 26.30 kg/m  EFM: baseline 120/moderate variability/+ accels, no decels  CVE: Dilation: 5.5 Effacement (%): 70 Cervical Position: Posterior Station: -2 Presentation: Vertex Exam by:: Mary Martinique Johnson, RN    A&P: 24 y.o. G2P0010 [redacted]w[redacted]d IOL for pre-e with severe features  #Labor: s/p foley bulb and cytotec x1. Pit at 15mL/hr, continue to titrate up as tolerated. Plan for SVE in 4 hours or earlier if indicated  #Pain: per patient request #FWB: Cat I  #GBS PCR negative, still awaiting cx. On PCN until cx returns #Pre-E: On Mg. BP's stable, most recent 127/78. Will continue to monitor vitals and BP. Has required 1 dose 20mg  IV Labetolol thus far. #Pre-term IOL: s/p betamethasome x1, 2nd dose this afternoon @1300  #A1GDM: check CBGs q4 in latent labor, q2 in active labor.   Sharion Settler, DO 12:58 AM

## 2020-04-24 NOTE — Progress Notes (Signed)
Labor Progress Note Stacy Warner is a 24 y.o. G2P0010 at [redacted]w[redacted]d presented for IOL for pre-eclampsia with severe-range blood pressures, FGR 3.4%, A1GDM S: Patient is feeling contractions every 3-5 minutes. Pain is better controlled with IV pain meds. Would like to hold off on Epidural for as long as possible.  O:  BP 128/84   Pulse 70   Temp 97.9 F (36.6 C) (Oral)   Resp 16   Ht 5\' 5"  (1.651 m)   Wt 71.7 kg   LMP 08/17/2019   SpO2 100% Comment: room air  BMI 26.30 kg/m  EFM: baseline 115/moderate variability/+accels, no decels  CVE: Dilation: 5.5 Effacement (%): 70, 80 Cervical Position: Posterior Station: 0 Presentation: Vertex Exam by:: [name], RN   A&P: 24 y.o. G2P0010 [redacted]w[redacted]d here for IOL for pre-E with severe features (severe-range BP's).  #Labor: On pit at 26mL. Continue to titrate up and reassess cervical change in 4h or earlier if indicated  #Pain: IV pain meds per patient request #FWB: Cat I  #GBS negative on PCR. On PCN until cx results  #Pre-E: Stable. On Mg. BP's stable. Will continue to monitor vital signs, FHT and clinical changes #Pre-term: betamethasone x1, 2nd dose today @1300  #A1GDM: CBG @0339  was 113. CBG q4h in latent labor, q2h in active labor. Endotool for CBG >126  Sharion Settler, DO 4:54 AM

## 2020-04-24 NOTE — Progress Notes (Signed)
Patient ID: Stacy Warner, female   DOB: Nov 24, 1995, 24 y.o.   MRN: [id number] Stacy Warner is a 24 y.o. G2P0010 at [redacted]w[redacted]d admitted for induction of labor due to pre-e w/ severe features, FGR 3.4%, A1DM.  Subjective: Very uncomfortable w/ uc's, wanting epidural  Objective: BP 119/78   Pulse 71   Temp (!) 97.4 F (36.3 C) (Axillary)   Resp 18   Ht 5\' 5"  (1.651 m)   Wt 71.7 kg   LMP 08/17/2019   SpO2 100% Comment: room air  BMI 26.30 kg/m  No intake/output data recorded.  FHT:  FHR: 110 bpm, variability: min-mod,  accelerations: 10x10s  decelerations:  Absent, not tracing well d/t position changes/pain- discussed FSE, wants to hold off til gets epidural UC:   regular, every 2 minutes, not tracing well d/t position changes  SVE:   Dilation: 5.5 Effacement (%): 70, 80 Station: 0 Exam by:: [name], RN  Pitocin @ 18 mu/min  Labs: Lab Results  Component Value Date   WBC 8.1 04/23/2020   HGB 11.6 (L) 04/23/2020   HCT 36.1 04/23/2020   MCV 87.8 04/23/2020   PLT 328 04/23/2020   CBG (last 3)  Recent Labs    04/23/20 2206 04/24/20 0011 04/24/20 0339  GLUCAP 139* 100* 113*    Assessment / Plan: IOL d/t pre-e w/ severe features, FGR 3.4%, A1DM, s/p cytotec x1 and foley bulb, pit @ 59mu/min, plan SVE w/ AROM/FSE if needed after epidural  Labor: Progressing normally Fetal Wellbeing:  Category II Pain Control:  IV pain meds and waiting on cbc for epidural Pre-eclampsia: stable on mag I/D:  PCN, GBS pcr neg, cx pending Anticipated MOD:  NSVD  Roma Schanz CNM, WHNP-BC 04/24/2020, 8:14 AM

## 2020-04-24 NOTE — Anesthesia Procedure Notes (Signed)
Epidural Patient location during procedure: floor Start time: 04/24/2020 8:33 AM End time: 04/24/2020 8:40 AM  Staffing Anesthesiologist: Merlinda Frederick, MD Performed: anesthesiologist   Preanesthetic Checklist Completed: patient identified, IV checked, site marked, risks and benefits discussed, surgical consent, monitors and equipment checked, pre-op evaluation and timeout performed  Epidural Patient position: sitting Prep: ChloraPrep Approach: midline Location: L3-L4 Injection technique: LOR saline and LOR air  Needle:  Needle type: Tuohy  Needle gauge: 17 G Needle length: 9 cm Needle insertion depth: 5 cm Catheter type: closed end flexible Catheter size: 20 Guage Catheter at skin depth: 10 cm Test dose: negative

## 2020-04-24 NOTE — Anesthesia Preprocedure Evaluation (Signed)
Anesthesia Evaluation  Patient identified by MRN, date of birth, ID band  Reviewed: Allergy & Precautions, NPO status , Patient's Chart, lab work & pertinent test results  Airway Mallampati: II  TM Distance: >3 FB Neck ROM: Full    Dental no notable dental hx.    Pulmonary asthma , former smoker,    Pulmonary exam normal breath sounds clear to auscultation       Cardiovascular Exercise Tolerance: Good hypertension, Pt. on medications Normal cardiovascular exam Rhythm:Regular Rate:Normal  Preeclampsia on Mg gtt   Neuro/Psych    GI/Hepatic   Endo/Other  diabetes, Gestational  Renal/GU      Musculoskeletal   Abdominal   Peds  Hematology  (+) Blood dyscrasia, anemia ,   Anesthesia Other Findings   Reproductive/Obstetrics (+) Pregnancy                             Anesthesia Physical Anesthesia Plan  ASA: III  Anesthesia Plan: Epidural   Post-op Pain Management:    Induction:   PONV Risk Score and Plan: 2  Airway Management Planned:   Additional Equipment:   Intra-op Plan:   Post-operative Plan:   Informed Consent: I have reviewed the patients History and Physical, chart, labs and discussed the procedure including the risks, benefits and alternatives for the proposed anesthesia with the patient or authorized representative who has indicated his/her understanding and acceptance.       Plan Discussed with:   Anesthesia Plan Comments:         Anesthesia Quick Evaluation

## 2020-04-25 LAB — CULTURE, BETA STREP (GROUP B ONLY)

## 2020-04-25 LAB — GLUCOSE, CAPILLARY: Glucose-Capillary: 86 mg/dL (ref 70–99)

## 2020-04-25 MED ORDER — NIFEDIPINE ER OSMOTIC RELEASE 30 MG PO TB24
30.0000 mg | ORAL_TABLET | Freq: Every day | ORAL | Status: DC
  Administered 2020-04-25 – 2020-04-26 (×2): 30 mg via ORAL
  Filled 2020-04-25 (×2): qty 1

## 2020-04-25 MED ORDER — MEDROXYPROGESTERONE ACETATE 150 MG/ML IM SUSP
150.0000 mg | INTRAMUSCULAR | Status: DC
  Administered 2020-04-26: 150 mg via INTRAMUSCULAR
  Filled 2020-04-25: qty 1

## 2020-04-25 NOTE — Lactation Note (Signed)
This note was copied from a Stacy's chart. Lactation Consultation Note  Patient Name: Stacy Warner Date: 04/25/2020  Stacy Warner now 5 hours old.   Mom reports her nipples are a little sore from pumping. Discussed flange fit.  Got mom  coconut oil and reviewed how to use it.,  Urged to make sure nipples in the very center of the flange and hand express and rub expressed mothers milk on nipples and air dry. Urged mom to call and have pumping observed again in nipples still sore.  Explained to mom that sometimes nipples can enlarge with pumping.  Urged her to call lactation as needed.   Maternal Data    Feeding Feeding Type: Donor Breast Milk  LATCH Score                   Interventions    Lactation Tools Discussed/Used     Consult Status      Stacy Warner 04/25/2020, 10:48 PM

## 2020-04-25 NOTE — Clinical Social Work Maternal (Signed)
CLINICAL SOCIAL WORK MATERNAL/CHILD NOTE  Patient Details  Name: Stacy Warner MRN: [id number] Date of Birth: 12/30/1995  Date:  04/25/2020  Clinical Social Worker Initiating Note:  Byanka Landrus Boyd-Gilyard Date/Time: Initiated:  04/25/20/1401     Child's Name:  Stacy Warner   Biological Parents:  Mother, Father   Need for Interpreter:  None   Reason for Referral:  Current Substance Use/Substance Use During Pregnancy  (hx of marijuana use.)   Address:  [address] [place] [address]    Phone number:  [phone] (home)     Additional phone number: FOB's number is [phone]  Household Members/Support Persons (HM/SP):   Household Member/Support Person 1   HM/SP Name Relationship DOB or Age  HM/SP -1 Brandon Warner FOB 01/08/1994  HM/SP -2        HM/SP -3        HM/SP -4        HM/SP -5        HM/SP -6        HM/SP -7        HM/SP -8          Natural Supports (not living in the home):  Extended Family, Parent, Immediate Family (MOB also reported that FOB's family will also provide support if needed.)   Professional Supports:     Employment: Unemployed   Type of Work:     Education:  Some College   Homebound arranged:    Financial Resources:  Medicaid   Other Resources:  WIC, Food Stamps    Cultural/Religious Considerations Which May Impact Care:  Per MOB's Face Sheet, MOB is Christian.   Strengths:  Ability to meet basic needs , Home prepared for child , Pediatrician chosen   Psychotropic Medications:         Pediatrician:    [place] area  Pediatrician List:   [place] Other ([facility]  [place]    [place]    [place]    [place]    [place]      Pediatrician Fax Number:    Risk Factors/Current Problems:  Substance Use    Cognitive State:  Able to Concentrate , Alert , Goal Oriented , Insightful , Linear Thinking    Mood/Affect:  Interested , Comfortable , Happy , Bright  , Calm , Relaxed    CSW Assessment: CSW met with MOB in room 117 to complete an assessment for a consult for hx of THC use in pregnancy.  When CSW arrived, MOB was conversing with FOB and MOB's mother. CSW explained CSW's role and with MOB's permission, CSW asked FOB and MGM to leave in order to assess MOB in private. MOB was polite, forthcoming, and easy to engage.  CSW inquired about MOB's substance use, and MOB reported utilizing marijuana during pregnancy to assist with decreasing MOB's nausea. MOB reported last use of a marijuana was about 1 month ago.  CSW informed MOB of the hospital's drug screen policy. MOB was made aware of the 2 drug screenings for the infant.  MOB was understanding and did not have any concerns.  CSW shared with MOB that the infant's UDS is positive for THC and CSW will make a report to [place] CPS (report made to CPS worker Pam M.). MOB is aware the CSW will continue to monitor the infant's CDS and will update [place] CPS of the results. CSW offered MOB resources and referrals for substance interventions and MOB declined.    MOB did not have any questions about the hospital's policy and CPS protocol. MOB reported having all essential items for infant and feeling well informed by the NICU team. MOB denied having any barriers to visiting with infant post MOB's discharge. CSW will continue to offer resources and supports to family while infant remains in NICU.    CSW Plan/Description:  Psychosocial Support and Ongoing Assessment of Needs, Sudden Infant Death Syndrome (SIDS) Education, Perinatal Mood and Anxiety Disorder (PMADs) Education, [place], Other Information/Referral to [employer], Child Protective Service Report , CSW Will Continue to Monitor Umbilical Cord Tissue Drug Screen Results and Make Report if Warranted   Laurey Arrow, MSW, LCSW Clinical Social Work [phone]  Dimple Nanas,  [place] 04/25/2020, 2:07 PM

## 2020-04-25 NOTE — Anesthesia Postprocedure Evaluation (Signed)
Anesthesia Post Note  Patient: Stacy Warner  Procedure(s) Performed: AN AD HOC LABOR EPIDURAL     Patient location during evaluation: OB High Risk Anesthesia Type: Epidural Level of consciousness: awake, awake and alert and oriented Pain management: pain level controlled Vital Signs Assessment: post-procedure vital signs reviewed and stable Respiratory status: spontaneous breathing and respiratory function stable Cardiovascular status: blood pressure returned to baseline Postop Assessment: no headache, epidural receding, patient able to bend at knees, adequate PO intake, no backache, no apparent nausea or vomiting and able to ambulate Anesthetic complications: no   No complications documented.  Last Vitals:  Vitals:   04/25/20 0530 04/25/20 0615  BP:    Pulse:    Resp: 16 20  Temp:    SpO2:      Last Pain:  Vitals:   04/25/20 0446  TempSrc: Oral  PainSc:    Pain Goal:                   Bufford Spikes

## 2020-04-25 NOTE — Lactation Note (Addendum)
This note was copied from a baby's chart. Lactation Consultation Note  Patient Name: Stacy Warner [id number] Date: 04/25/2020 Reason for consult: Initial assessment;NICU baby;Late-preterm 34-36.6wks;Infant < 6lbs   Mother is a P1, infant is 68 hours old and is in the NICU, she is 35+6 weeks and wt is 4-5 lbs.  Mother was given Providing Breastmilk for  Your NICU Baby Booklet and reviewed milk storage guidelines , collection  and cleaning pump parts.    Mother was given [facility] brochure and basic teaching done.    Reviewed hand expression with mother. Observed large drops of colostrum. Assist with collecting 2 colostrum vials half full. Mother very excited that she has this much colostrum. Yellow colostrum dots given and breastmilk labels ordered.   Assist mother with pumping. She using a #24 flange. She observed a few drops . After pumping hand expressed a few more drops in the colostrum vial.  Mother reports that she has a Medela and a Spectra pump at home. She reports that she is not active with WIC.Marland Kitchen she did phone [place] to discuss services.   Advised mother to  Pump using a DEBP every 2-3 hours for 15-20 mins.  Mother plans to the NICU and attempt to breastfeed infant when family gets back to go with her.   .  Mother to continue to due STS. Mother is aware of available LC services at [facility], BFSG'S, OP Dept, and phone # for questions or concerns about breastfeeding.  Mother receptive to all teaching and plan of care.    Maternal Data Has patient been taught Hand Expression?: Yes Does the patient have breastfeeding experience prior to this delivery?: No  Feeding Feeding Type: Donor Breast Milk  LATCH Score                   Interventions Interventions: Hand express;Hand pump;DEBP  Lactation Tools Discussed/Used WIC Program: No (mother reports that she has phoned [facility]) Pump Review: Setup, frequency, and cleaning;Milk Storage Initiated by:: staff member Date initiated::  04/25/20   Consult Status      Stacy Warner 04/25/2020, 12:54 PM

## 2020-04-25 NOTE — Progress Notes (Signed)
Faculty Attending Note  Post Partum Day 1  Subjective: Patient is feeling very well. She reports minimal pain. She is ambulating and denies light-headedness or dizziness. She is passing flatus. She is tolerating a regular diet without nausea/vomiting. Bleeding is moderate. She is breast feeding. Baby is in NICU and doing well.  Objective: Blood pressure (!) 133/94, pulse 78, temperature 97.8 F (36.6 C), resp. rate 18, height 5\' 5"  (1.651 m), weight 71.7 kg, last menstrual period 08/17/2019, SpO2 100 %, unknown if currently breastfeeding. Temp:  [97.1 F (36.2 C)-98.4 F (36.9 C)] 97.8 F (36.6 C) (08/26 0818) Pulse Rate:  [71-106] 78 (08/26 0819) Resp:  [16-20] 18 (08/26 0818) BP: (109-151)/(69-112) 133/94 (08/26 0819) SpO2:  [98 %-100 %] 100 % (08/26 0818)  Physical Exam:  General: alert, oriented, cooperative Chest: normal respiratory effort Heart: RRR  Abdomen: soft, appropriately tender to palpation  Uterine Fundus: firm, 2 fingers below the umbilicus Lochia: moderate, rubra DVT Evaluation: no evidence of DVT Extremities: no edema, no calf tenderness  UOP: voiding spontaneously  Recent Labs    04/23/20 1227 04/24/20 0738  HGB 11.6* 11.9*  HCT 36.1 36.8    Assessment/Plan: Patient Active Problem List   Diagnosis Date Noted  . Severe pre-eclampsia 04/23/2020  . Indication for care in labor or delivery 04/23/2020  . Gestational diabetes mellitus (GDM) in third trimester 03/05/2020  . Pyelonephritis affecting pregnancy 01/14/2020  . Asymptomatic bacteriuria during pregnancy in first trimester 11/09/2019  . Asthma   . Supervision of high-risk pregnancy 10/30/2019  . Tobacco abuse 04/23/2016  . Marijuana abuse 04/23/2016  . Iron deficiency anemia 04/18/2016    Class: Acute    Patient is 24 y.o. [id number] PPD#1 s/p SVD at [redacted]w[redacted]d. Course complicated by severe pre-eclampsia for which she was induced. Remains on mag until 24 hrs post partum. She is doing very well,  recovering appropriately and complains only of mild pain, feeling slightly off which she thinks is due to mag.   Mag until 24 hrs post partum Add anti-hypertensives prn Continue routine post partum care Pain meds prn Regular diet depo for birth control Plan for discharge possibly tomorrow Needs GTT in office The patient was counseled on the potential benefits and lack of known risks of COVID vaccination, during pregnancy and breastfeeding. The patient's questions and concerns were addressed today, including wanting to be vaccinated at same time as partner. The patient is not planning to get vaccinated at this time.    Feliz Beam, M.D. Attending Center for [employer] (Faculty Practice)  04/25/2020, 10:40 AM

## 2020-04-26 ENCOUNTER — Ambulatory Visit: Payer: Medicaid Other

## 2020-04-26 LAB — SURGICAL PATHOLOGY

## 2020-04-26 LAB — GLUCOSE, CAPILLARY: Glucose-Capillary: 100 mg/dL — ABNORMAL HIGH (ref 70–99)

## 2020-04-26 MED ORDER — NIFEDIPINE ER 30 MG PO TB24
30.0000 mg | ORAL_TABLET | Freq: Every day | ORAL | 1 refills | Status: DC
Start: 2020-04-27 — End: 2020-10-09

## 2020-04-26 MED ORDER — IBUPROFEN 600 MG PO TABS
600.0000 mg | ORAL_TABLET | Freq: Four times a day (QID) | ORAL | 0 refills | Status: DC
Start: 2020-04-26 — End: 2020-06-14

## 2020-04-26 MED FILL — NIFEdipine ER 30 MG TB24: 30 | 30 days supply | Qty: 30 | Fill #0

## 2020-04-26 MED FILL — IBUPROFEN 600 MG TABLET: 600 | 7 days supply | Qty: 30 | Fill #0

## 2020-04-26 NOTE — Discharge Summary (Signed)
Postpartum Discharge Summary      Patient Name: Stacy Warner DOB: 08-28-1996 MRN: [id number]  Date of admission: 04/23/2020 Delivery date:04/24/2020  Delivering provider: [name]  Date of discharge: 04/26/2020  Admitting diagnosis: Preeclampsia [O14.90] Indication for care in labor or delivery [O75.9] Intrauterine pregnancy: [redacted]w[redacted]d    Secondary diagnosis:  Active Problems:   Iron deficiency anemia   Tobacco abuse   Marijuana abuse   Supervision of high-risk pregnancy   Asymptomatic bacteriuria during pregnancy in first trimester   Asthma   Pyelonephritis affecting pregnancy   Gestational diabetes mellitus (GDM) in third trimester   Severe pre-eclampsia   Indication for care in labor or delivery  Additional problems: none    Discharge diagnosis: Term Pregnancy Delivered, Preeclampsia (severe) and GDM A1                                              Post partum procedures:Depo provera Augmentation: AROM, Pitocin, Cytotec and IP Foley Complications: None  Hospital course: Induction of Labor With Vaginal Delivery   24y.o. yo G2P0111 at 363w6das admitted to the hospital 04/23/2020 for induction of labor.  Indication for induction: Preeclampsia and A1 DM.  Patient had an uncomplicated labor course as follows: Membrane Rupture Time/Date: 9:52 AM ,04/24/2020   Delivery Method:Vaginal, Spontaneous  Episiotomy: None  Lacerations:    Details of delivery can be found in separate delivery note.  Patient had a postpartum course with magnesium x 24 hours. Started on Procardia for BP control, and BP improved. Patient is discharged home 04/26/20.  Newborn Data: Birth date:04/24/2020  Birth time:11:41 AM  Gender:Female  Living status:Living  Apgars:2 ,8  Weight:1961 g   Magnesium Sulfate received: Yes: Seizure prophylaxis BMZ received: Yes Rhophylac:N/A MMR:No T-DaP:Given postpartum Flu: No Transfusion:No  Physical exam  Vitals:   04/25/20 2221 04/26/20 0214 04/26/20  0556 04/26/20 0826  BP: (!) 126/91 133/88 131/87 127/81  Pulse: 87 81 89 90  Resp: _0 Temp: 98.5 F (36.9 C) 98.3 F (36.8 C) 98.4 F (36.9 C) 98.2 F (36.8 C)  TempSrc: Oral Oral Oral Oral  SpO2: 98%   100%  Weight:      Height:       General: alert, cooperative and no distress Lochia: appropriate Uterine Fundus: firm Incision: No significant erythema DVT Evaluation: No evidence of DVT seen on physical exam. Labs: Lab Results  Component Value Date   WBC 15.1 (H) 04/24/2020   HGB 11.9 (L) 04/24/2020   HCT 36.8 04/24/2020   MCV 87.0 04/24/2020   PLT 388 04/24/2020   CMP Latest Ref Rng & Units 04/23/2020  Glucose 70 - 99 mg/dL 81  BUN 6 - 20 mg/dL <5(L)  Creatinine 0.44 - 1.00 mg/dL 0.86  Sodium 135 - 145 mmol/L 135  Potassium 3.5 - 5.1 mmol/L 3.6  Chloride 98 - 111 mmol/L 107  CO2 22 - 32 mmol/L 19(L)  Calcium 8.9 - 10.3 mg/dL 8.9  Total Protein 6.5 - 8.1 g/dL 6.1(L)  Total Bilirubin 0.3 - 1.2 mg/dL 0.5  Alkaline Phos 38 - 126 U/L 82  AST 15 - 41 U/L 22  ALT 0 - 44 U/L 12   Edinburgh Score: Edinburgh Postnatal Depression Scale Screening Tool 04/25/2020  I have been able to laugh and see the funny side of things. 0  I  have looked forward with enjoyment to things. 0  I have blamed myself unnecessarily when things went wrong. 1  I have been anxious or worried for no good reason. 1  I have felt scared or panicky for no good reason. 0  Things have been getting on top of me. 1  I have been so unhappy that I have had difficulty sleeping. 0  I have felt sad or miserable. 0  I have been so unhappy that I have been crying. 0  The thought of harming myself has occurred to me. 0  Edinburgh Postnatal Depression Scale Total 3     After visit meds:  Allergies as of 04/26/2020   No Known Allergies     Medication List    STOP taking these medications   Accu-Chek Guide test strip Generic drug: glucose blood   Accu-Chek Guide w/Device Kit   Accu-Chek  Softclix Lancets lancets   acetaminophen 500 MG tablet Commonly known as: TYLENOL   Blood Pressure Monitor Kit   butalbital-acetaminophen-caffeine 50-325-40 MG tablet Commonly known as: [place] Supp Sm Misc   doxylamine (Sleep) 25 MG tablet Commonly known as: UNISOM   famotidine 20 MG tablet Commonly known as: Pepcid   labetalol 100 MG tablet Commonly known as: NORMODYNE   ondansetron 4 MG tablet Commonly known as: Zofran     TAKE these medications   ibuprofen 600 MG tablet Commonly known as: ADVIL Take 1 tablet (600 mg total) by mouth every 6 (six) hours.   loratadine 10 MG tablet Commonly known as: Claritin Take 1 tablet (10 mg total) by mouth daily.   NIFEdipine 30 MG 24 hr tablet Commonly known as: ADALAT CC Take 1 tablet (30 mg total) by mouth daily. Start taking on: April 27, 2020   Vitafol Gummies 3.33-0.333-34.8 MG Chew Chew 3 each by mouth daily.        Discharge home in stable condition Infant Feeding: Bottle and Breast Infant Disposition:NICU Discharge instruction: per After Visit Summary and Postpartum booklet. Activity: Advance as tolerated. Pelvic rest for 6 weeks.  Diet: routine diet Future Appointments: Future Appointments  Date Time Provider [place]  04/29/2020  4:15 PM Constant, Vickii Chafe, MD [place] None  05/07/2020 11:15 AM Cephas Darby, MD [place] None   Follow up Visit:  [place] for [place] at [facility] Follow up in 4 week(s).   Specialty: Obstetrics and Gynecology Contact information: [place] [id number] [phone]               Please schedule this patient for a In person postpartum visit in 4 weeks with the following provider: Any provider. Additional Postpartum F/U:BP check 1 week  High risk pregnancy complicated by: GDM needs 2 hour OGT Delivery mode:  Vaginal, Spontaneous  Anticipated  Birth Control:  Depo   04/26/2020 Donnamae Jude, MD

## 2020-04-26 NOTE — Lactation Note (Signed)
This note was copied from a baby's chart. Lactation Consultation Note  Patient Name: Stacy Warner [id number] Date: 04/26/2020   Attempted to visit with mom again but she wasn't in her room, she went to the NICU to see her baby. LC will follow up with mom later.  Maternal Data    Feeding    LATCH Score                   Interventions    Lactation Tools Discussed/Used     Consult Status      Stacy Warner 04/26/2020, 10:26 AM

## 2020-04-26 NOTE — Lactation Note (Signed)
This note was copied from a baby's chart. Lactation Consultation Note  Patient Name: Stacy Warner [id number] Date: 04/26/2020 Reason for consult: Follow-up assessment;Late-preterm 34-36.6wks;Infant < 6lbs;NICU baby;Infant weight loss  Visited with mom of a 63 hours old LPI NICU female, she's currently pumping 5-6 times/day because her nipples are still sore. Mom reported to [facility] that she's now using the # 27 flanges and coconut oil prior pumping. She's not really getting volume with the pump yet, but she does with hand expression, praised her for her efforts.  Encouraged her to keep doing hand expression, even after her milk comes in, discussed benefits of breast massage and hand expression prior pumping for maximum yield. Reviewed pumping schedule, supply and demand, prevention/treatment of sore nipples and also engorgement prevention/treatment since mom is getting discharged today. Baby is currently on donor milk and at 6% weight loss.  Feeding plan:  1. Encouraged mom to start pumping every 3 hours, at least 8 pumping sessions 24 hours 2. Hand expression and breast massage were also encouraged prior pumping.  Dad present and supportive. Mom will communicate with baby's NICU RN to schedule a feeding assist once baby is ready to go to breast, she's currently on an NG tube, they haven't started bottles yet. Parents reported all questions and concerns were answered, they're both aware of [place] OP services and will call PRN.   Maternal Data    Feeding Feeding Type: Donor Breast Milk  LATCH Score                   Interventions Interventions: Breast feeding basics reviewed;Coconut oil;DEBP  Lactation Tools Discussed/Used Tools: Coconut oil;Pump;Flanges Flange Size: 27 Breast pump type: Double-Electric Breast Pump   Consult Status Consult Status: PRN Date:  (Mom is getting discharged today but will call lactation for feeding assist) Follow-up type: In-patient    Markham Dumlao  Francene Boyers 04/26/2020, 10:59 AM

## 2020-04-26 NOTE — Lactation Note (Signed)
This note was copied from a baby's chart. Lactation Consultation Note  Patient Name: Stacy Warner [id number] Date: 04/26/2020   Attempted to visit with mother but she was asleep. Her support person was asleep as well. LC to come back later to do [facility] follow up assessment.   Maternal Data    Feeding Feeding Type: Donor Breast Milk  LATCH Score                   Interventions    Lactation Tools Discussed/Used     Consult Status      Carleen Rhue Francene Boyers 04/26/2020, 8:46 AM

## 2020-04-26 NOTE — Discharge Instructions (Signed)
Postpartum Care After Vaginal Delivery This sheet gives you information about how to care for yourself from the time you deliver your baby to up to 6-12 weeks after delivery (postpartum period). Your health care provider may also give you more specific instructions. If you have problems or questions, contact your health care provider. Follow these instructions at home: Vaginal bleeding  It is normal to have vaginal bleeding (lochia) after delivery. Wear a sanitary pad for vaginal bleeding and discharge. ? During the first week after delivery, the amount and appearance of lochia is often similar to a menstrual period. ? Over the next few weeks, it will gradually decrease to a dry, yellow-brown discharge. ? For most women, lochia stops completely by 4-6 weeks after delivery. Vaginal bleeding can vary from woman to woman.  Change your sanitary pads frequently. Watch for any changes in your flow, such as: ? A sudden increase in volume. ? A change in color. ? Large blood clots.  If you pass a blood clot from your vagina, save it and call your health care provider to discuss. Do not flush blood clots down the toilet before talking with your health care provider.  Do not use tampons or douches until your health care provider says this is safe.  If you are not breastfeeding, your period should return 6-8 weeks after delivery. If you are feeding your child breast milk only (exclusive breastfeeding), your period may not return until you stop breastfeeding. Perineal care  Keep the area between the vagina and the anus (perineum) clean and dry as told by your health care provider. Use medicated pads and pain-relieving sprays and creams as directed.  If you had a cut in the perineum (episiotomy) or a tear in the vagina, check the area for signs of infection until you are healed. Check for: ? More redness, swelling, or pain. ? Fluid or blood coming from the cut or tear. ? Warmth. ? Pus or a bad  smell.  You may be given a squirt bottle to use instead of wiping to clean the perineum area after you go to the bathroom. As you start healing, you may use the squirt bottle before wiping yourself. Make sure to wipe gently.  To relieve pain caused by an episiotomy, a tear in the vagina, or swollen veins in the anus (hemorrhoids), try taking a warm sitz bath 2-3 times a day. A sitz bath is a warm water bath that is taken while you are sitting down. The water should only come up to your hips and should cover your buttocks. Breast care  Within the first few days after delivery, your breasts may feel heavy, full, and uncomfortable (breast engorgement). Milk may also leak from your breasts. Your health care provider can suggest ways to help relieve the discomfort. Breast engorgement should go away within a few days.  If you are breastfeeding: ? Wear a bra that supports your breasts and fits you well. ? Keep your nipples clean and dry. Apply creams and ointments as told by your health care provider. ? You may need to use breast pads to absorb milk that leaks from your breasts. ? You may have uterine contractions every time you breastfeed for up to several weeks after delivery. Uterine contractions help your uterus return to its normal size. ? If you have any problems with breastfeeding, work with your health care provider or Science writer.  If you are not breastfeeding: ? Avoid touching your breasts a lot. Doing this can make  your breasts produce more milk. ? Wear a good-fitting bra and use cold packs to help with swelling. ? Do not squeeze out (express) milk. This causes you to make more milk. Intimacy and sexuality  Ask your health care provider when you can engage in sexual activity. This may depend on: ? Your risk of infection. ? How fast you are healing. ? Your comfort and desire to engage in sexual activity.  You are able to get pregnant after delivery, even if you have not had  your period. If desired, talk with your health care provider about methods of birth control (contraception). Medicines  Take over-the-counter and prescription medicines only as told by your health care provider.  If you were prescribed an antibiotic medicine, take it as told by your health care provider. Do not stop taking the antibiotic even if you start to feel better. Activity  Gradually return to your normal activities as told by your health care provider. Ask your health care provider what activities are safe for you.  Rest as much as possible. Try to rest or take a nap while your baby is sleeping. Eating and drinking   Drink enough fluid to keep your urine pale yellow.  Eat high-fiber foods every day. These may help prevent or relieve constipation. High-fiber foods include: ? Whole grain cereals and breads. ? Brown rice. ? Beans. ? Fresh fruits and vegetables.  Do not try to lose weight quickly by cutting back on calories.  Take your prenatal vitamins until your postpartum checkup or until your health care provider tells you it is okay to stop. Lifestyle  Do not use any products that contain nicotine or tobacco, such as cigarettes and e-cigarettes. If you need help quitting, ask your health care provider.  Do not drink alcohol, especially if you are breastfeeding. General instructions  Keep all follow-up visits for you and your baby as told by your health care provider. Most women visit their health care provider for a postpartum checkup within the first 3-6 weeks after delivery. Contact a health care provider if:  You feel unable to cope with the changes that your child brings to your life, and these feelings do not go away.  You feel unusually sad or worried.  Your breasts become red, painful, or hard.  You have a fever.  You have trouble holding urine or keeping urine from leaking.  You have little or no interest in activities you used to enjoy.  You have not  breastfed at all and you have not had a menstrual period for 12 weeks after delivery.  You have stopped breastfeeding and you have not had a menstrual period for 12 weeks after you stopped breastfeeding.  You have questions about caring for yourself or your baby.  You pass a blood clot from your vagina. Get help right away if:  You have chest pain.  You have difficulty breathing.  You have sudden, severe leg pain.  You have severe pain or cramping in your lower abdomen.  You bleed from your vagina so much that you fill more than one sanitary pad in one hour. Bleeding should not be heavier than your heaviest period.  You develop a severe headache.  You faint.  You have blurred vision or spots in your vision.  You have bad-smelling vaginal discharge.  You have thoughts about hurting yourself or your baby. If you ever feel like you may hurt yourself or others, or have thoughts about taking your own life, get help  right away. You can go to the nearest emergency department or call:  Your local emergency services (911 in the [place]  A suicide crisis helpline, such as the [employer] at 412-730-5158. This is open 24 hours a day. Summary  The period of time right after you deliver your newborn up to 6-12 weeks after delivery is called the postpartum period.  Gradually return to your normal activities as told by your health care provider.  Keep all follow-up visits for you and your baby as told by your health care provider. This information is not intended to replace advice given to you by your health care provider. Make sure you discuss any questions you have with your health care provider. Document Revised: 08/20/2017 Document Reviewed: 05/31/2017 Elsevier Patient Education  [place]. Postpartum Hypertension Postpartum hypertension is high blood pressure that remains higher than normal after childbirth. You may not realize that you have  postpartum hypertension if your blood pressure is not being checked regularly. In most cases, postpartum hypertension will go away on its own, usually within a week of delivery. However, for some women, medical treatment is required to prevent serious complications, such as seizures or stroke. What are the causes? This condition may be caused by one or more of the following:  Hypertension that existed before pregnancy (chronic hypertension).  Hypertension that comes on as a result of pregnancy (gestational hypertension).  Hypertensive disorders during pregnancy (preeclampsia) or seizures in women who have high blood pressure during pregnancy (eclampsia).  A condition in which the liver, platelets, and red blood cells are damaged during pregnancy (HELLP syndrome).  A condition in which the thyroid produces too much hormones (hyperthyroidism).  Other rare problems of the nerves (neurological disorders) or blood disorders. In some cases, the cause may not be known. What increases the risk? The following factors may make you more likely to develop this condition:  Chronic hypertension. In some cases, this may not have been diagnosed before pregnancy.  Obesity.  Type 2 diabetes.  Kidney disease.  History of preeclampsia or eclampsia.  Other medical conditions that change the level of hormones in the body (hormonal imbalance). What are the signs or symptoms? As with all types of hypertension, postpartum hypertension may not have any symptoms. Depending on how high your blood pressure is, you may experience:  Headaches. These may be mild, moderate, or severe. They may also be steady, constant, or sudden in onset (thunderclap headache).  Changes in your ability to see (visual changes).  Dizziness.  Shortness of breath.  Swelling of your hands, feet, lower legs, or face. In some cases, you may have swelling in more than one of these locations.  Heart palpitations or a racing  heartbeat.  Difficulty breathing while lying down.  Decrease in the amount of urine that you pass. Other rare signs and symptoms may include:  Sweating more than usual. This lasts longer than a few days after delivery.  Chest pain.  Sudden dizziness when you get up from sitting or lying down.  Seizures.  Nausea or vomiting.  Abdominal pain. How is this diagnosed? This condition may be diagnosed based on the results of a physical exam, blood pressure measurements, and blood and urine tests. You may also have other tests, such as a CT scan or an MRI, to check for other problems of postpartum hypertension. How is this treated? If blood pressure is high enough to require treatment, your options may include:  Medicines to reduce blood pressure (  antihypertensives). Tell your health care provider if you are breastfeeding or if you plan to breastfeed. There are many antihypertensive medicines that are safe to take while breastfeeding.  Stopping medicines that may be causing hypertension.  Treating medical conditions that are causing hypertension.  Treating the complications of hypertension, such as seizures, stroke, or kidney problems. Your health care provider will also continue to monitor your blood pressure closely until it is within a safe range for you. Follow these instructions at home:  Take over-the-counter and prescription medicines only as told by your health care provider.  Return to your normal activities as told by your health care provider. Ask your health care provider what activities are safe for you.  Do not use any products that contain nicotine or tobacco, such as cigarettes and e-cigarettes. If you need help quitting, ask your health care provider.  Keep all follow-up visits as told by your health care provider. This is important. Contact a health care provider if:  Your symptoms get worse.  You have new symptoms, such as: ? A headache that does not get  better. ? Dizziness. ? Visual changes. Get help right away if:  You suddenly develop swelling in your hands, ankles, or face.  You have sudden, rapid weight gain.  You develop difficulty breathing, chest pain, racing heartbeat, or heart palpitations.  You develop severe pain in your abdomen.  You have any symptoms of a stroke. "BE FAST" is an easy way to remember the main warning signs of a stroke: ? B - Balance. Signs are dizziness, sudden trouble walking, or loss of balance. ? E - Eyes. Signs are trouble seeing or a sudden change in vision. ? F - Face. Signs are sudden weakness or numbness of the face, or the face or eyelid drooping on one side. ? A - Arms. Signs are weakness or numbness in an arm. This happens suddenly and usually on one side of the body. ? S - Speech. Signs are sudden trouble speaking, slurred speech, or trouble understanding what people say. ? T - Time. Time to call emergency services. Write down what time symptoms started.  You have other signs of a stroke, such as: ? A sudden, severe headache with no known cause. ? Nausea or vomiting. ? Seizure. These symptoms may represent a serious problem that is an emergency. Do not wait to see if the symptoms will go away. Get medical help right away. Call your local emergency services (911 in the [place] Do not drive yourself to the hospital. Summary  Postpartum hypertension is high blood pressure that remains higher than normal after childbirth.  In most cases, postpartum hypertension will go away on its own, usually within a week of delivery.  For some women, medical treatment is required to prevent serious complications, such as seizures or stroke. This information is not intended to replace advice given to you by your health care provider. Make sure you discuss any questions you have with your health care provider. Document Revised: 09/23/2018 Document Reviewed: 06/07/2017 Elsevier Patient Education  2020 [employer].

## 2020-04-26 NOTE — Progress Notes (Signed)
Pt discharged home with printed instructions. Pt verbalized an understanding. No concerns noted. Toya Smothers, RN

## 2020-04-26 NOTE — Plan of Care (Signed)
Pt discharged with printed instructions.

## 2020-04-29 ENCOUNTER — Encounter: Payer: Medicaid Other | Admitting: Obstetrics and Gynecology

## 2020-04-29 ENCOUNTER — Ambulatory Visit: Payer: Self-pay

## 2020-04-29 NOTE — Lactation Note (Signed)
This note was copied from a baby's chart. Lactation Consultation Note  Patient Name: Stacy Warner [id number] Date: 04/29/2020 Reason for consult: Follow-up assessment;NICU baby;Late-preterm 34-36.6wks;Infant < 6lbs   Baby 5 days old  CGA 107w4d.  < 5 lbs. Mother states she has been pumping 80 ml but did not know to bring her breastmilk to NICU for baby. Encouraged her to bring what she pumps at home.  Provided bottles. Discussed prepumping with mother.   Mother wanted to attempt breastfeeding again, this time she requested a nipple shield. Applied #20 NS.  Provided pillow under baby to bring to nipple height. Baby started slowly sucking and became rhythmic, some off and on & pauses with swallows for several short bursts.  Baby handled milk flow well with no leakage.  After 10 min baby came off.  Milk in nipple shield.   Attempted without nipple shield but baby did not sustain latch.   Encouraged mother to prepump before trying next time and apply #20NS.      Maternal Data    Feeding Feeding Type: Breast Fed  LATCH Score Latch: Repeated attempts needed to sustain latch, nipple held in mouth throughout feeding, stimulation needed to elicit sucking reflex.  Audible Swallowing: A few with stimulation  Type of Nipple: Everted at rest and after stimulation  Comfort (Breast/Nipple): Soft / non-tender  Hold (Positioning): Assistance needed to correctly position infant at breast and maintain latch.  LATCH Score: 7  Interventions Interventions: Breast feeding basics reviewed;Assisted with latch;Hand express;Pre-pump if needed;Support pillows;DEBP  Lactation Tools Discussed/Used Tools: Pump;Nipple Shields Nipple shield size: 20 Breast pump type: Double-Electric Breast Pump   Consult Status Consult Status: Follow-up Date: 04/01/20 Follow-up type: In-patient    Vivianne Master [facility] 04/29/2020, 11:38 AM

## 2020-05-01 ENCOUNTER — Ambulatory Visit: Payer: Medicaid Other

## 2020-05-01 ENCOUNTER — Other Ambulatory Visit: Payer: Self-pay

## 2020-05-01 VITALS — BP 127/85 | HR 107

## 2020-05-01 DIAGNOSIS — Z0130 Encounter for examination of blood pressure without abnormal findings: Secondary | ICD-10-CM

## 2020-05-01 NOTE — Progress Notes (Signed)
..  Subjective:  Stacy Warner is a 24 y.o. female here for BP check.   Hypertension ROS: taking medications as instructed, no medication side effects noted, no TIA's, no chest pain on exertion, no dyspnea on exertion and no swelling of ankles.    Objective:  BP 127/85   Pulse (!) 107   LMP 08/17/2019   Appearance alert, well appearing, and in no distress. General exam BP noted to be well controlled today in office.    Assessment:   Blood Pressure well controlled.   Plan:  Current treatment plan is effective, no change in therapy.  Pt has pp appointment on 06-05-20

## 2020-05-01 NOTE — Progress Notes (Signed)
I have reviewed this chart and agree with the RN/CMA assessment and management.    K. Meryl Chalsey Leeth, M.D. Attending [facility] (Faculty Practice)   

## 2020-05-02 ENCOUNTER — Inpatient Hospital Stay (HOSPITAL_COMMUNITY)
Admission: AD | Admit: 2020-05-02 | Payer: Medicaid Other | Source: Home / Self Care | Admitting: Obstetrics and Gynecology

## 2020-05-02 ENCOUNTER — Inpatient Hospital Stay (HOSPITAL_COMMUNITY): Payer: Medicaid Other

## 2020-05-03 ENCOUNTER — Ambulatory Visit: Payer: Medicaid Other

## 2020-05-03 ENCOUNTER — Ambulatory Visit: Payer: Self-pay

## 2020-05-03 NOTE — Lactation Note (Signed)
This note was copied from a baby's chart. Lactation Consultation Note  Patient Name: Stacy Warner Date: 05/03/2020 Reason for consult: Follow-up assessment;NICU baby;Early term 37-38.6wks;Infant < 6lbs  LC in to visit with P78 Mom and 4 day old baby in the NICU.  Baby is 4 lbs 11 oz and finished the 72 hr protected breastfeeding window and is now being bottle fed 63%.  Baby gained 110 gms last night.   Mom holding baby swaddled.  Encouraged STS with baby.  Offered to assist/assess with next feeding or tomorrow.  Mom states baby breastfeeds great and she plans to breastfeed baby during the day and dad will bottle feed at night as Mom works at night.    Mom pumping regularly along with baby's feeding schedule and has a great milk supply.    Baby is not on ad lib feeding yet.    Mom to ask for RN to call [facility] for next feeding for assessment.    Interventions Interventions: Breast feeding basics reviewed;DEBP  Lactation Tools Discussed/Used Tools: Pump Breast pump type: Double-Electric Breast Pump   Consult Status Consult Status: Follow-up Date: 05/04/20 Follow-up type: In-patient    Broadus John 05/03/2020, 12:43 PM

## 2020-05-07 ENCOUNTER — Encounter: Payer: Medicaid Other | Admitting: Obstetrics and Gynecology

## 2020-06-05 ENCOUNTER — Other Ambulatory Visit: Payer: Medicaid Other

## 2020-06-05 ENCOUNTER — Ambulatory Visit: Payer: Medicaid Other | Admitting: Obstetrics & Gynecology

## 2020-06-14 ENCOUNTER — Other Ambulatory Visit: Payer: Self-pay

## 2020-06-14 ENCOUNTER — Ambulatory Visit (INDEPENDENT_AMBULATORY_CARE_PROVIDER_SITE_OTHER): Payer: Medicaid Other | Admitting: Family Medicine

## 2020-06-14 ENCOUNTER — Other Ambulatory Visit: Payer: Medicaid Other

## 2020-06-14 ENCOUNTER — Encounter: Payer: Self-pay | Admitting: Family Medicine

## 2020-06-14 VITALS — BP 130/85 | HR 103 | Wt 139.4 lb

## 2020-06-14 DIAGNOSIS — O24410 Gestational diabetes mellitus in pregnancy, diet controlled: Secondary | ICD-10-CM

## 2020-06-14 NOTE — Progress Notes (Signed)
[place] Partum Visit Note  Stacy Warner is a 24 y.o. [phone] female who presents for a postpartum visit. She is 7 weeks postpartum following a normal spontaneous vaginal delivery.  I have fully reviewed the prenatal and intrapartum course. The delivery was at [redacted]w[redacted]d gestational weeks.  Anesthesia: epidural. Postpartum course has been. Baby is doing well. Baby is feeding by bottle - Similac Neosure. Bleeding yes , just some intermittent spotting. Bowel function is normal. Bladder function is normal. Patient is not sexually active. Contraception method is Depo-Provera injections, received prior to hospital discharge. Postpartum depression screening: negative, she was initially sad when she was discharged as baby was still in NICU but her mood has been stable since baby has been discharged.  PreE w/SF: discharged on nifedipine 30mg  xl, forgets to take some days. Not checking BP at home. Currently asymptomatic.   The pregnancy intention screening data noted above was reviewed. Potential methods of contraception were discussed. The patient elected to proceed with Hormonal Injection.    Edinburgh Postnatal Depression Scale - 06/14/20 0825      Edinburgh Postnatal Depression Scale:  In the Past 7 Days   I have been able to laugh and see the funny side of things. 0    I have looked forward with enjoyment to things. 0    I have blamed myself unnecessarily when things went wrong. 0    I have been anxious or worried for no good reason. 0    I have felt scared or panicky for no good reason. 0    Things have been getting on top of me. 0    I have been so unhappy that I have had difficulty sleeping. 0    I have felt sad or miserable. 0    I have been so unhappy that I have been crying. 0    The thought of harming myself has occurred to me. 0    Edinburgh Postnatal Depression Scale Total 0           The following portions of the patient's history were reviewed and updated as appropriate: allergies,  current medications, past family history, past medical history, past social history, past surgical history and problem list.  Review of Systems Pertinent items noted in HPI and remainder of comprehensive ROS otherwise negative.    Objective:  Blood pressure 130/85, pulse (!) 103, weight 139 lb 6.4 oz (63.2 kg), last menstrual period 08/17/2019, unknown if currently breastfeeding.  General:  alert, cooperative and no distress   Breasts:  deferred  Lungs: normal respiratory effort  Heart: Regular rate/rhythm  Abdomen: soft, nontender   Vulva:  not evaluated  Vagina: not evaluated  Cervix:  not evaluated  Corpus: not examined  Adnexa:  not evaluated  Rectal Exam: Not performed.        Assessment:    Normal postpartum exam. Pap smear not done at today's visit, NILM 11/03/19.  Plan:   Essential components of care per ACOG recommendations:  1.  Mood and well being: Patient with negative depression screening today. Reviewed local resources for support.  - Patient does not use tobacco.  - hx of drug use? Yes  Denies marijuana use since delivery of child.  2. Infant care and feeding:  -Patient currently breastmilk feeding? No * - Social determinants of health (SDOH) reviewed in EPIC. No concerns, patient has food stamps and WIC set up.  3. Sexuality, contraception and birth spacing - Patient does not want a pregnancy in  the next year.  Desired family size is unknown # children.  - Reviewed forms of contraception in tiered fashion. Patient desired Depo-Provera, given prior to hospital discharge, scheduled next depo injection today. - Discussed birth spacing of 18 months  4. Sleep and fatigue -Encouraged family/partner/community support of 4 hrs of uninterrupted sleep to help with mood and fatigue  5. Physical Recovery  - Discussed patients delivery and complications - Patient had a sidewall laceration, perineal healing reviewed. Patient expressed understanding - Patient has urinary  incontinence? No - Patient is safe to resume physical and sexual activity  6.  Health Maintenance - Last pap smear done 11/03/2019 and was normal.  7. cHTN - PCP follow up, continue medication for now, asymptomatic  Arrie Senate, [place] for [employer], [place]

## 2020-06-15 LAB — GLUCOSE TOLERANCE, 2 HOURS
Glucose, 2 hour: 67 mg/dL (ref 65–139)
Glucose, GTT - Fasting: 88 mg/dL (ref 65–99)

## 2020-06-27 ENCOUNTER — Other Ambulatory Visit: Payer: Self-pay | Admitting: Obstetrics

## 2020-06-27 DIAGNOSIS — Z3480 Encounter for supervision of other normal pregnancy, unspecified trimester: Secondary | ICD-10-CM

## 2020-07-03 ENCOUNTER — Other Ambulatory Visit: Payer: Self-pay

## 2020-07-03 ENCOUNTER — Encounter: Payer: Self-pay | Admitting: Obstetrics

## 2020-07-03 ENCOUNTER — Ambulatory Visit (INDEPENDENT_AMBULATORY_CARE_PROVIDER_SITE_OTHER): Payer: Medicaid Other | Admitting: Obstetrics

## 2020-07-03 VITALS — Ht 64.0 in | Wt 145.0 lb

## 2020-07-03 DIAGNOSIS — Z3009 Encounter for other general counseling and advice on contraception: Secondary | ICD-10-CM

## 2020-07-03 DIAGNOSIS — Z30011 Encounter for initial prescription of contraceptive pills: Secondary | ICD-10-CM | POA: Diagnosis not present

## 2020-07-03 DIAGNOSIS — B9689 Other specified bacterial agents as the cause of diseases classified elsewhere: Secondary | ICD-10-CM

## 2020-07-03 DIAGNOSIS — L089 Local infection of the skin and subcutaneous tissue, unspecified: Secondary | ICD-10-CM | POA: Diagnosis not present

## 2020-07-03 MED ORDER — SULFAMETHOXAZOLE-TRIMETHOPRIM 800-160 MG PO TABS: 1.0000 | ORAL_TABLET | Freq: Two times a day (BID) | ORAL | 0 refills | Status: DC

## 2020-07-03 MED ORDER — MUPIROCIN CALCIUM 2 % EX CREA: 1.0000 "application " | TOPICAL_CREAM | Freq: Two times a day (BID) | CUTANEOUS | 0 refills | Status: DC

## 2020-07-03 MED ORDER — MUPIROCIN 2 % EX OINT: 1.0000 "application " | TOPICAL_OINTMENT | Freq: Two times a day (BID) | CUTANEOUS | 1 refills | Status: DC

## 2020-07-03 MED ORDER — NORGESTIM-ETH ESTRAD TRIPHASIC 0.18/0.215/0.25 MG-25 MCG PO TABS: 1.0000 | ORAL_TABLET | Freq: Every day | ORAL | 11 refills | Status: DC

## 2020-07-03 NOTE — Progress Notes (Signed)
24 y.o GYN presents for Staph skin infection. C/o pimple on the back of her neck which she burst and now its all over her chin.

## 2020-07-03 NOTE — Progress Notes (Signed)
Patient ID: Stacy Warner, female   DOB: 1996-03-01, 24 y.o.   MRN: [id number]  Chief Complaint  Patient presents with  . Injections    HPI Stacy Warner is a 24 y.o. female.  Complains of multiple sores on face that started out on her neck. HPI  Past Medical History:  Diagnosis Date  . Anemia   . Asthma   . Chlamydia    age 89  . Infection    UTI    Past Surgical History:  Procedure Laterality Date  . TONSILLECTOMY      Family History  Problem Relation Age of Onset  . Heart disease Paternal Grandmother   . Stroke Paternal Grandmother   . Thyroid disease Paternal Grandfather     Social History Social History   Tobacco Use  . Smoking status: Former Smoker    Packs/day: 0.25    Years: 1.00    Pack years: 0.25    Types: Cigars  . Smokeless tobacco: Never Used  . Tobacco comment: black and mild  Vaping Use  . Vaping Use: Never used  Substance Use Topics  . Alcohol use: Not Currently    Comment: occasional, not since confirmed pregnancy  . Drug use: Not Currently    Types: Marijuana    Comment: not sinc e1st trimester    No Known Allergies  Current Outpatient Medications  Medication Sig Dispense Refill  . mupirocin cream (BACTROBAN) 2 % Apply 1 application topically 2 (two) times daily. 30 g 0  . NIFEdipine (ADALAT CC) 30 MG 24 hr tablet Take 1 tablet (30 mg total) by mouth daily. (Patient not taking: Reported on 07/03/2020) 30 tablet 1  . Norgestimate-Ethinyl Estradiol Triphasic (ORTHO TRI-CYCLEN LO) 0.18/0.215/0.25 MG-25 MCG tab Take 1 tablet by mouth daily. 28 tablet 11  . sulfamethoxazole-trimethoprim (BACTRIM DS) 800-160 MG tablet Take 1 tablet by mouth 2 (two) times daily. 28 tablet 0   No current facility-administered medications for this visit.    Review of Systems Review of Systems Constitutional: negative for fatigue and weight loss Respiratory: negative for cough and wheezing Cardiovascular: negative for chest pain, fatigue and  palpitations Gastrointestinal: negative for abdominal pain and change in bowel habits Genitourinary:negative Integument/breast: positive for multiple sores on face Musculoskeletal:negative for myalgias Neurological: negative for gait problems and tremors Behavioral/Psych: negative for abusive relationship, depression Endocrine: negative for temperature intolerance      Height 5\' 4"  (1.626 m), weight 145 lb (65.8 kg), last menstrual period 08/17/2019, unknown if currently breastfeeding.  Physical Exam Physical Exam General:   alert and no distress  Skin:   multiple facial furuncles, fresh and healing  Lungs:   clear to auscultation bilaterally  Heart:   regular rate and rhythm, S1, S2 normal, no murmur, click, rub or gallop  Breasts:   normal without suspicious masses, skin or nipple changes or axillary nodes   50% of 15 min visit spent on counseling and coordination of care.   Data Reviewed Labs  Assessment     1. Skin infection, bacterial Rx: - sulfamethoxazole-trimethoprim (BACTRIM DS) 800-160 MG tablet; Take 1 tablet by mouth 2 (two) times daily.  Dispense: 28 tablet; Refill: 0 - mupirocin cream (BACTROBAN) 2 %; Apply 1 application topically 2 (two) times daily.  Dispense: 30 g; Refill: 0 - Ambulatory referral to Dermatology  2. Encounter for other general counseling and advice on contraception - wants to switch from Depo to OCP's  3. Encounter for initial prescription of contraceptive pills Rx: - Norgestimate-Ethinyl  Estradiol Triphasic (ORTHO TRI-CYCLEN LO) 0.18/0.215/0.25 MG-25 MCG tab; Take 1 tablet by mouth daily.  Dispense: 28 tablet; Refill: 11    Plan  Follow up in 4 months for Annual / Pap   Orders Placed This Encounter  Procedures  . Ambulatory referral to Dermatology    Referral Priority:   Routine    Referral Type:   Consultation    Referral Reason:   Specialty Services Required    Requested Specialty:   Dermatology    Number of Visits Requested:   1    Meds ordered this encounter  Medications  . sulfamethoxazole-trimethoprim (BACTRIM DS) 800-160 MG tablet    Sig: Take 1 tablet by mouth 2 (two) times daily.    Dispense:  28 tablet    Refill:  0  . mupirocin cream (BACTROBAN) 2 %    Sig: Apply 1 application topically 2 (two) times daily.    Dispense:  30 g    Refill:  0  . Norgestimate-Ethinyl Estradiol Triphasic (ORTHO TRI-CYCLEN LO) 0.18/0.215/0.25 MG-25 MCG tab    Sig: Take 1 tablet by mouth daily.    Dispense:  28 tablet    Refill:  11     Shelly Bombard, MD 07/03/2020 11:52 AM

## 2020-07-03 NOTE — Addendum Note (Signed)
Addended by: Baltazar Najjar A on: 07/03/2020 03:54 PM   Modules accepted: Orders

## 2020-07-17 ENCOUNTER — Ambulatory Visit: Payer: Medicaid Other

## 2020-08-14 IMAGING — US US MFM OB FOLLOW-UP
1 series · 13 of 28 positions shown · non-contrast
Comparison: none

[Series 1: us mfm ob follow-up · 13 of 61 slices shown]
[im 3/61]
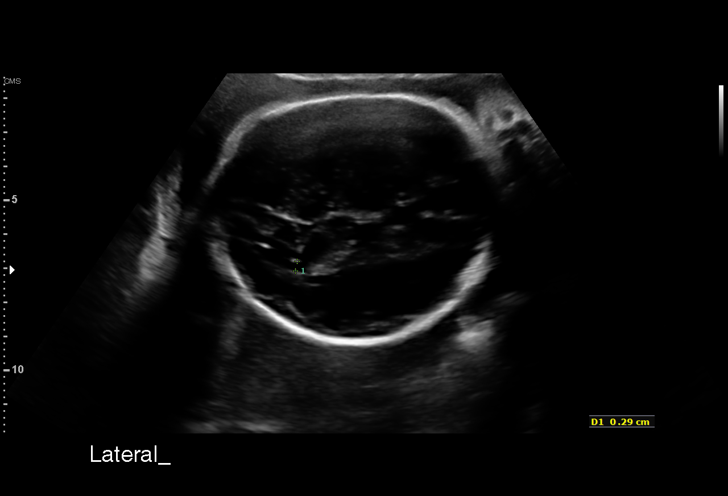
[im 7/61]
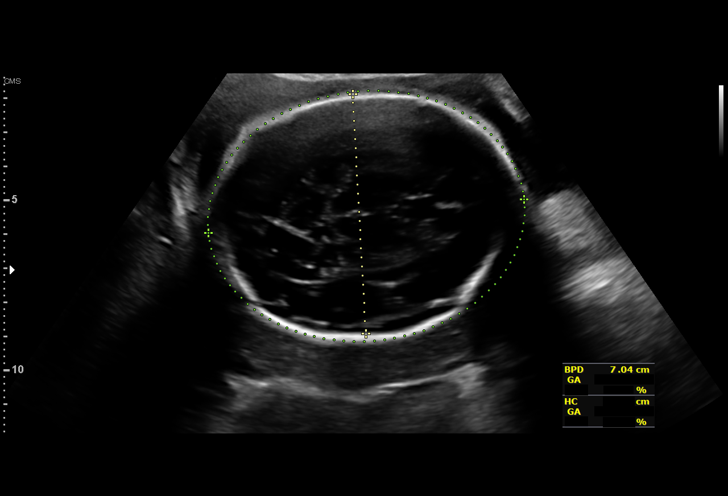
[im 12/61]
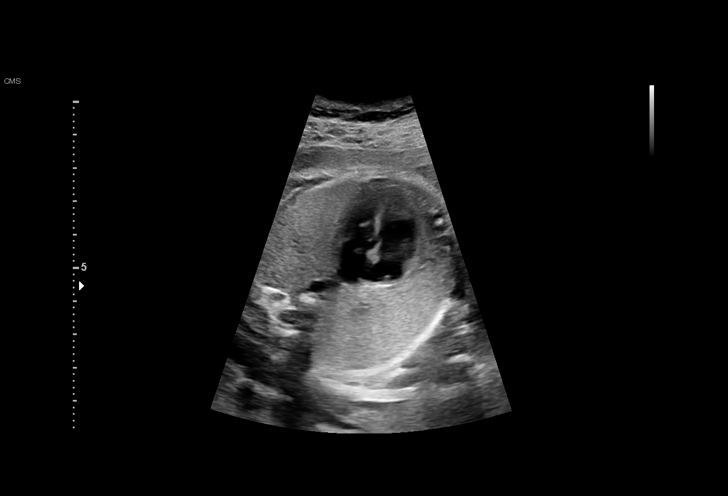
[im 16/61]
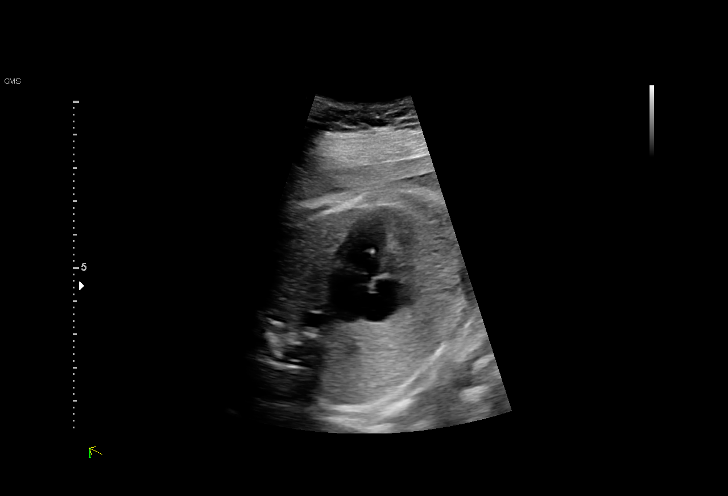
[im 21/61]
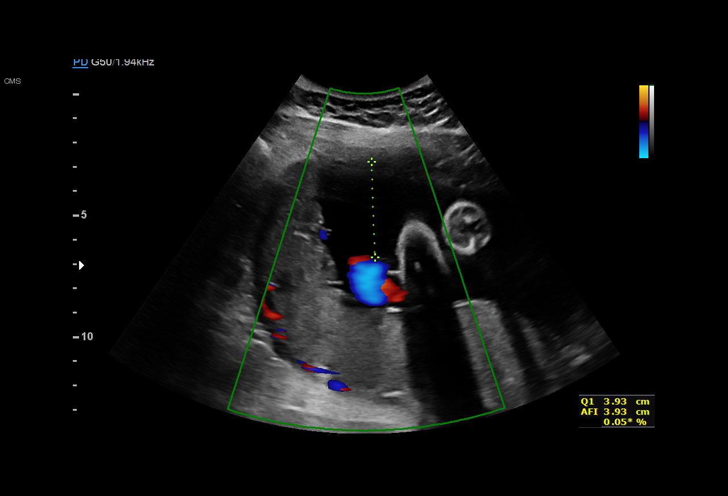
[im 25/61]
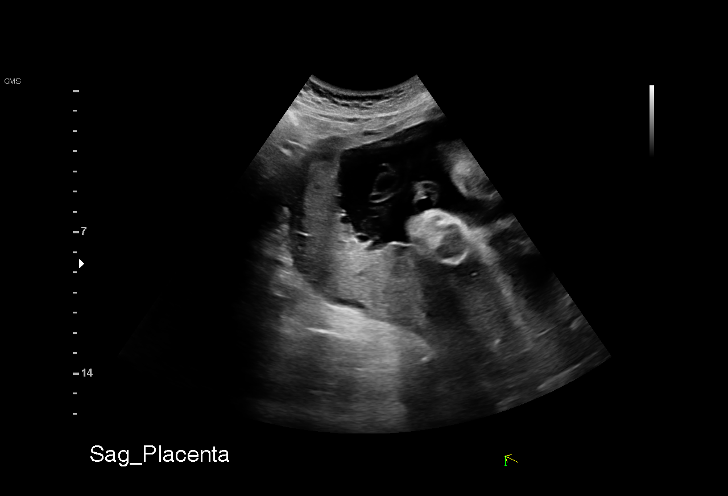
[im 32/61]
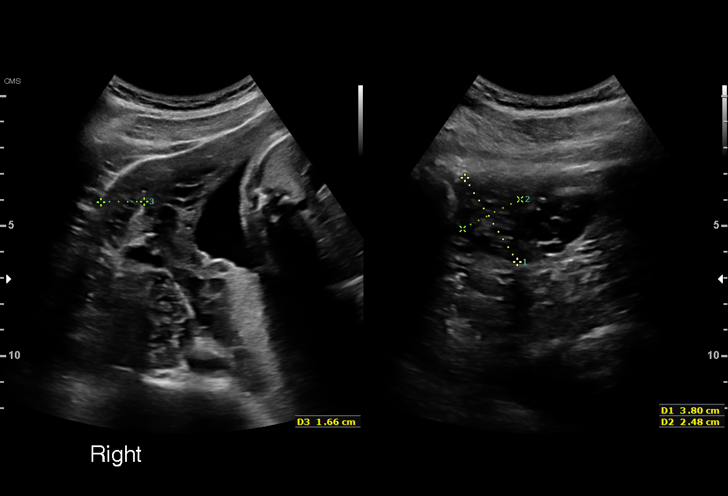
[im 36/61]
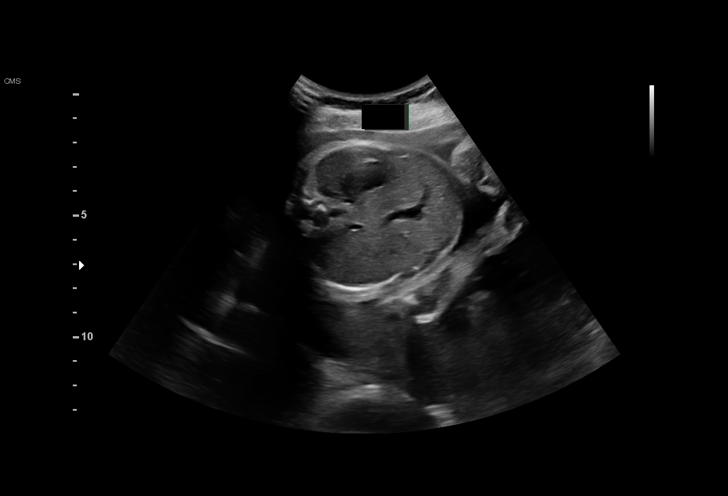
[im 41/61]
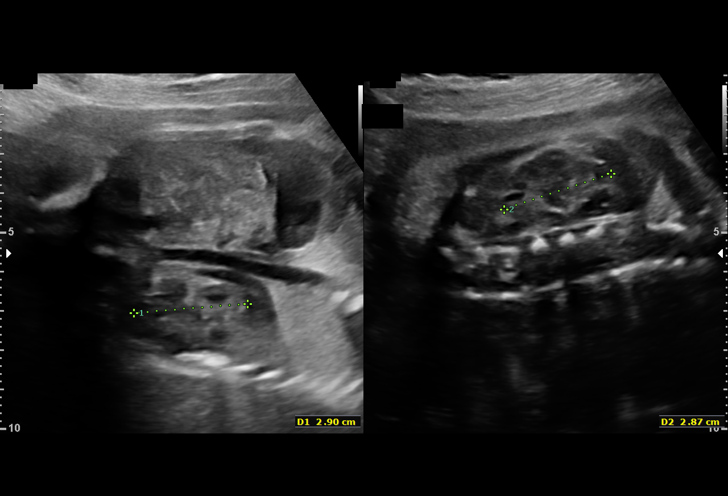
[im 45/61]
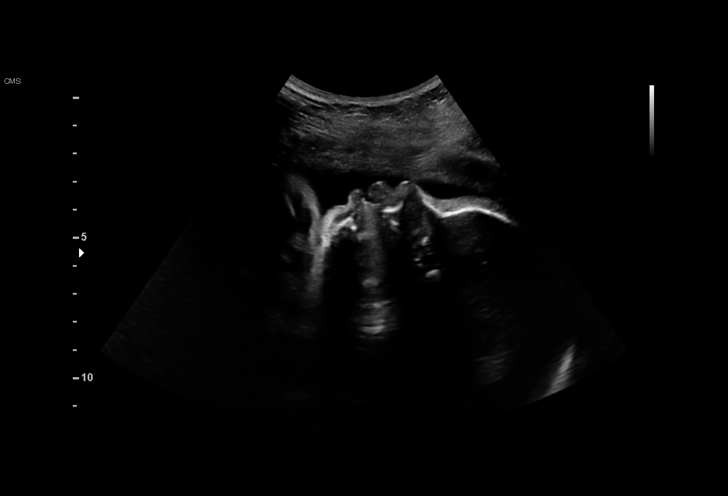
[im 49/61]
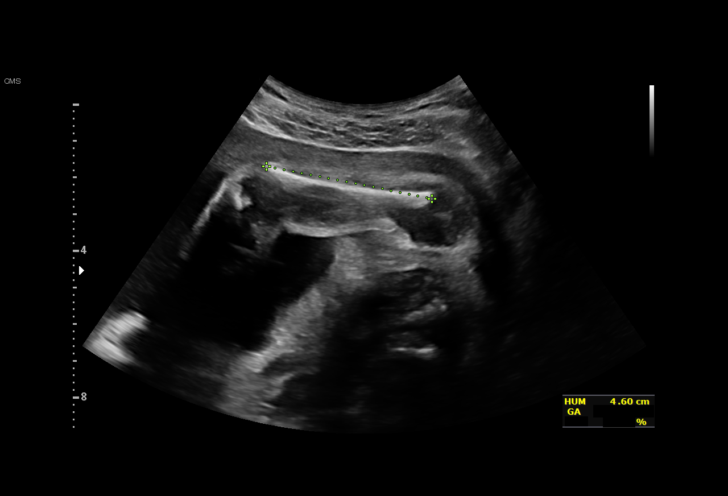
[im 54/61]
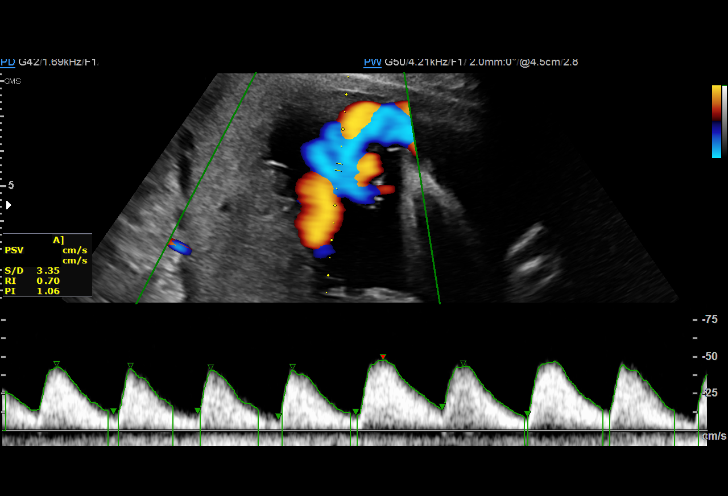
[im 58/61]
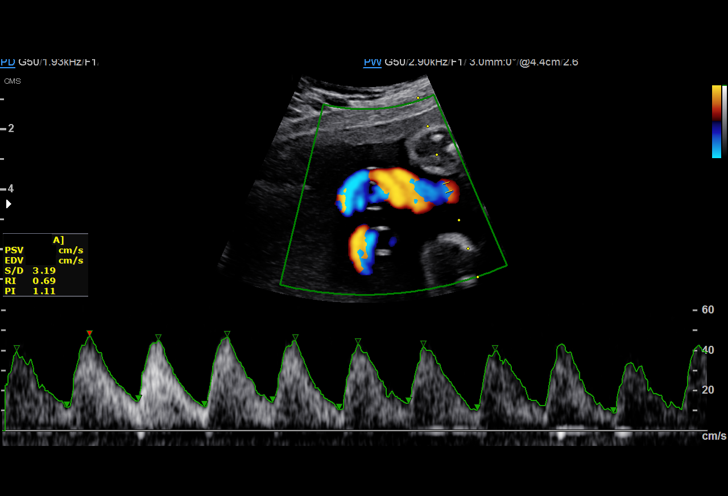

[13 of 28 positions shown; findings below may reference images not displayed]

Indications

 Encounter for other antenatal screening
 follow-up
 Fetal abnormality - other known or
 suspected (EIF)
 Marijuana Use
 Elevated blood pressure affecting pregnancy
 in third trimester
 LR NIPS
 Asthma                                         AUU.IU [id number]
 Maternal care for known or suspected poor
 fetal growth, third trimester, not applicable or
 unspecified IUGR
 28 weeks gestation of pregnancy
 Gestational diabetes in pregnancy, diet
 controlled
Fetal Evaluation

 Num Of Fetuses:         1
 Fetal Heart Rate(bpm):  144
 Cardiac Activity:       Observed
 Presentation:           Cephalic
 Placenta:               Posterior
 P. Cord Insertion:      Visualized

 Amniotic Fluid
 AFI FV:      Within normal limits

 AFI Sum(cm)     %Tile       Largest Pocket(cm)
 15.4            55
 RUQ(cm)       RLQ(cm)       LUQ(cm)        LLQ(cm)

Biometry

 BPD:      70.4  mm     G. Age:  28w 2d         24  %    CI:        72.21   %    70 - 86
                                                         FL/HC:      19.5   %    19.6 -
 HC:      263.6  mm     G. Age:  28w 5d         18  %    HC/AC:      1.13        0.99 -
 AC:      232.4  mm     G. Age:  27w 4d         14  %    FL/BPD:     72.9   %    71 - 87
 FL:       51.3  mm     G. Age:  27w 3d          8  %    FL/AC:      22.1   %    20 - 24
 HUM:      45.8  mm     G. Age:  27w 0d          9  %

 Est. FW:    8888  gm      2 lb 7 oz     10  %
OB History

 Gravidity:    2
 TOP:          1        Living:  0
Gestational Age

 LMP:           28w 5d        Date:  08/17/19                 EDD:   05/23/20
 U/S Today:     28w 0d                                        EDD:   05/28/20
 Best:          28w 5d     Det. By:  LMP  (08/17/19)          EDD:   05/23/20
Anatomy

 Cranium:               Appears normal         LVOT:                   Appears normal
 Cavum:                 Appears normal         Aortic Arch:            Previously seen
 Ventricles:            Appears normal         Ductal Arch:            Previously seen
 Choroid Plexus:        Appears normal         Diaphragm:              Appears normal
 Cerebellum:            Appears normal         Stomach:                Appears normal, left
                                                                       sided
 Posterior Fossa:       Previously seen        Abdomen:                Appears normal
 Nuchal Fold:           Not applicable (>20    Abdominal Wall:         Previously seen
                        wks GA)
 Face:                  Appears normal         Cord Vessels:           Previously seen
                        (orbits and profile)
 Lips:                  Appears normal         Kidneys:                Appear normal
 Palate:                Not well visualized    Bladder:                Appears normal
 Thoracic:              Appears normal         Spine:                  Previously seen
 Heart:                 Echogenic focus        Upper Extremities:      Previously seen
                        in LV
 RVOT:                  Appears normal         Lower Extremities:      Previously seen

 Other:  Female gender previously seen. 3VV and 3VTV previously visualized.
         Nasal bone previously visualized. Open hands, Heels and 5th digit
         previously visualized.
Doppler - Fetal Vessels

 Umbilical Artery
  S/D     %tile      RI    %tile                             ADFV    RDFV
  3.53       78    0.77       94                                No      No

Cervix Uterus Adnexa
 Cervix
 Not visualized (advanced GA >91wks)

 Uterus
 No abnormality visualized.

 Right Ovary
 Within normal limits.

 Left Ovary
 Not visualized.

 Cul De Sac
 No free fluid seen.

 Adnexa
 No abnormality visualized.
Impression

 Follow up growth and amniotic fluid due to IUGR
 Normal interval growth consistent with IUGR EFW 10th%
 There is good fetal movement and amniotic fluid. The UA
 Dopplers are normal
Recommendations

 Continue weekly testing with UA Dopplers
 Repeat growth in 3-4 weeks.

## 2020-10-09 ENCOUNTER — Other Ambulatory Visit: Payer: Self-pay

## 2020-10-09 ENCOUNTER — Ambulatory Visit (INDEPENDENT_AMBULATORY_CARE_PROVIDER_SITE_OTHER): Payer: Medicaid Other | Admitting: *Deleted

## 2020-10-09 DIAGNOSIS — Z30013 Encounter for initial prescription of injectable contraceptive: Secondary | ICD-10-CM | POA: Diagnosis not present

## 2020-10-09 LAB — POCT URINE PREGNANCY: Preg Test, Ur: NEGATIVE

## 2020-10-09 MED ORDER — MEDROXYPROGESTERONE ACETATE 150 MG/ML IM SUSP: 150.0000 mg | INTRAMUSCULAR | 3 refills | Status: DC

## 2020-10-09 NOTE — Progress Notes (Addendum)
Pt is in office for UPT to restart Depo.  UPT in office today is Negative. Pt advised to RTO in 2 weeks for repeat UPT and Depo injection if Neg results.   Rx for Depo sent to pharmacy today. Pt advised to bring with her to next visit.   Pt states understanding and has no other concerns.   Patient was assessed and managed by nursing staff during this encounter. I have reviewed the chart and agree with the documentation and plan. I have also made any necessary editorial changes.  Baltazar Najjar, MD 10/10/2020 8:15 AM

## 2020-10-13 ENCOUNTER — Other Ambulatory Visit: Payer: Self-pay | Admitting: Obstetrics

## 2020-10-13 DIAGNOSIS — Z30011 Encounter for initial prescription of contraceptive pills: Secondary | ICD-10-CM

## 2020-10-23 ENCOUNTER — Other Ambulatory Visit: Payer: Self-pay

## 2020-10-23 ENCOUNTER — Ambulatory Visit (INDEPENDENT_AMBULATORY_CARE_PROVIDER_SITE_OTHER): Payer: Medicaid Other

## 2020-10-23 DIAGNOSIS — Z3042 Encounter for surveillance of injectable contraceptive: Secondary | ICD-10-CM

## 2020-10-23 LAB — POCT URINE PREGNANCY: Preg Test, Ur: NEGATIVE

## 2020-10-23 MED ORDER — MEDROXYPROGESTERONE ACETATE 150 MG/ML IM SUSP
150.0000 mg | INTRAMUSCULAR | Status: DC
  Administered 2020-10-23: 150 mg via INTRAMUSCULAR

## 2020-10-23 NOTE — Progress Notes (Signed)
Pt is in the office for 2nd UPT and depo injection. UPT is negative today, administered depo in RD, and pt tolerated well Next due May 11-25 .Marland Kitchen Administrations This Visit    medroxyPROGESTERone (DEPO-PROVERA) injection 150 mg    Admin Date 10/23/2020 Action Given Dose 150 mg Route Intramuscular Administered By Hinton Lovely, RN

## 2021-01-08 ENCOUNTER — Ambulatory Visit: Payer: Medicaid Other

## 2021-03-28 ENCOUNTER — Ambulatory Visit
Admission: EM | Admit: 2021-03-28 | Discharge: 2021-03-28 | Disposition: A | Discharge status: Home / Self Care | Payer: Medicaid Other | Attending: Physician Assistant | Admitting: Physician Assistant

## 2021-03-28 ENCOUNTER — Other Ambulatory Visit: Payer: Self-pay

## 2021-03-28 DIAGNOSIS — L02212 Cutaneous abscess of back [any part, except buttock]: Secondary | ICD-10-CM | POA: Diagnosis not present

## 2021-03-28 MED ORDER — MUPIROCIN 2 % EX OINT: 1.0000 "application " | TOPICAL_OINTMENT | Freq: Two times a day (BID) | CUTANEOUS | 1 refills | Status: DC

## 2021-03-28 MED ORDER — CEPHALEXIN 500 MG PO CAPS: 500.0000 mg | ORAL_CAPSULE | Freq: Three times a day (TID) | ORAL | 0 refills | Status: DC

## 2021-03-28 NOTE — ED Provider Notes (Signed)
[facility]    CSN: [id number] Arrival date & time: 03/28/21  1420      History   Chief Complaint Chief Complaint  Patient presents with   Abscess    HPI Stacy Warner is a 25 y.o. female.   Patient presents today with a 3-day history of worsening skin lesion on her back.  Reports she is not in any pain if there is no palpation but is experiencing 6 out of 10 pain with pressure or palpation, localized to affected area, described as pressure/throbbing, no aggravating leaving factors identified.  She does have a history of recurrent skin infections acute symptoms are similar to previous episodes of this condition.  Denies any recent antibiotic use.  Denies history of MRSA.  Denies history of diabetes or immunosuppression.  Denies additional symptoms including fever, nausea, vomiting, dizziness, syncope.   Past Medical History:  Diagnosis Date   Anemia    Asthma    Chlamydia    age 62   Infection    UTI    Patient Active Problem List   Diagnosis Date Noted   Severe pre-eclampsia 04/23/2020   Indication for care in labor or delivery 04/23/2020   Gestational diabetes mellitus (GDM) in third trimester 03/05/2020   Pyelonephritis affecting pregnancy 01/14/2020   Asthma    Tobacco abuse 04/23/2016   Marijuana abuse 04/23/2016   Iron deficiency anemia 04/18/2016    Class: Acute    Past Surgical History:  Procedure Laterality Date   TONSILLECTOMY      OB History     Gravida  2   Para  1   Term      Preterm  1   AB  1   Living  1      SAB      IAB  1   Ectopic      Multiple  0   Live Births  1            Home Medications    Prior to Admission medications   Medication Sig Start Date End Date Taking? Authorizing Provider  cephALEXin (KEFLEX) 500 MG capsule Take 1 capsule (500 mg total) by mouth 3 (three) times daily. 03/28/21  Yes Nyheem Binette, Junie Panning K, PA-C  mupirocin ointment (BACTROBAN) 2 % Apply 1 application topically 2 (two) times  daily. 03/28/21   Joyanne Eddinger, Derry Skill, PA-C    Family History Family History  Problem Relation Age of Onset   Heart disease Paternal Grandmother    Stroke Paternal Grandmother    Thyroid disease Paternal Grandfather     Social History Social History   Tobacco Use   Smoking status: Former    Packs/day: 0.25    Years: 1.00    Pack years: 0.25    Types: Cigars, Cigarettes   Smokeless tobacco: Never   Tobacco comments:    black and mild  Vaping Use   Vaping Use: Never used  Substance Use Topics   Alcohol use: Not Currently    Comment: occasional, not since confirmed pregnancy   Drug use: Not Currently    Types: Marijuana    Comment: not sinc e1st trimester     Allergies   Patient has no known allergies.   Review of Systems Review of Systems  Constitutional:  Negative for activity change, appetite change, fatigue and fever.  Respiratory:  Negative for cough and shortness of breath.   Cardiovascular:  Negative for chest pain.  Gastrointestinal:  Negative for abdominal pain, diarrhea, nausea  and vomiting.  Musculoskeletal:  Negative for arthralgias and myalgias.  Skin:  Positive for color change and wound.  Neurological:  Negative for dizziness, light-headedness and headaches.    Physical Exam Triage Vital Signs ED Triage Vitals [03/28/21 1555]  Enc Vitals Group     BP 124/82     Pulse Rate 88     Resp 18     Temp 98.2 F (36.8 C)     Temp Source Oral     SpO2 99 %     Weight      Height      Head Circumference      Peak Flow      Pain Score 3     Pain Loc      Pain Edu?      Excl. in [place]?    No data found.  Updated Vital Signs BP 124/82 (BP Location: Left Arm)   Pulse 88   Temp 98.2 F (36.8 C) (Oral)   Resp 18   LMP 03/22/2021   SpO2 99%   Breastfeeding No   Visual Acuity Right Eye Distance:   Left Eye Distance:   Bilateral Distance:    Right Eye Near:   Left Eye Near:    Bilateral Near:     Physical Exam Vitals reviewed.   Constitutional:      General: She is awake. She is not in acute distress.    Appearance: Normal appearance. She is normal weight. She is not ill-appearing.     Comments: Very pleasant female appears in age no acute distress sitting comfortably in exam room  HENT:     Head: Normocephalic and atraumatic.  Cardiovascular:     Rate and Rhythm: Normal rate and regular rhythm.     Heart sounds: Normal heart sounds, S1 normal and S2 normal. No murmur heard. Pulmonary:     Effort: Pulmonary effort is normal.     Breath sounds: Normal breath sounds. No wheezing, rhonchi or rales.     Comments: Clear to auscultation bilaterally Abdominal:     Palpations: Abdomen is soft.     Tenderness: There is no abdominal tenderness.  Skin:    Findings: Abscess present.          Comments: 2 cm x 1 cm abscess noted left thoracic back.  No streaking or evidence of lymphangitis.  No active bleeding or drainage noted.  Psychiatric:        Behavior: Behavior is cooperative.     UC Treatments / Results  Labs (all labs ordered are listed, but only abnormal results are displayed) Labs Reviewed - No data to display  EKG   Radiology No results found.  Procedures Incision and Drainage  Date/Time: 03/28/2021 5:00 PM Performed by: Terrilee Croak, PA-C Authorized by: Terrilee Croak, PA-C   Consent:    Consent obtained:  Verbal   Consent given by:  Patient   Risks, benefits, and alternatives were discussed: yes     Risks discussed:  Bleeding, incomplete drainage and pain   Alternatives discussed:  Delayed treatment, alternative treatment and observation Universal protocol:    Procedure explained and questions answered to patient or proxy's satisfaction: yes     Patient identity confirmed:  Verbally with patient Location:    Type:  Abscess   Size:  2 cm x 1 cm   Location:  Trunk   Trunk location:  Back Pre-procedure details:    Skin preparation:  Chlorhexidine Sedation:    Sedation type:  None Anesthesia:    Anesthesia method:  Local infiltration   Local anesthetic:  Lidocaine 2% WITH epi Procedure type:    Complexity:  Simple Procedure details:    Ultrasound guidance: no     Needle aspiration: no     Incision types:  Stab incision   Incision depth:  Dermal   Wound management:  Probed and deloculated and irrigated with saline   Drainage:  Bloody and purulent   Drainage amount:  Moderate   Wound treatment:  Wound left open   Packing materials:  None Post-procedure details:    Procedure completion:  Tolerated well, no immediate complications (including critical care time)  Medications Ordered in UC Medications - No data to display  Initial Impression / Assessment and Plan / UC Course  I have reviewed the triage vital signs and the nursing notes.  Pertinent labs & imaging results that were available during my care of the patient were reviewed by me and considered in my medical decision making (see chart for details).      I&D performed in clinic patient tolerated this well with improvement of symptoms.  She was prescribed Bactroban and started on Keflex.  Discussed that if symptoms recur she will need to see a surgeon to consider removal of cyst.  Discussed alarm symptoms that warrant emergent evaluation.  Strict return precautions given to which patient expressed understanding.   Final Clinical Impressions(s) / UC Diagnoses   Final diagnoses:  Abscess of back     Discharge Instructions      We drained your abscess today.  Start Keflex 500 mg 3 times a day.  Use Bactroban with dressing changes.  If you develop any worsening symptoms including fever, nausea, vomiting, recurrent abscess you need to be reevaluated immediately.  Please follow-up with your primary care provider within 1 week to ensure symptom improvement.     ED Prescriptions     Medication Sig Dispense Auth. Provider   mupirocin ointment (BACTROBAN) 2 % Apply 1 application topically 2 (two)  times daily. 30 g Caitriona Sundquist K, PA-C   cephALEXin (KEFLEX) 500 MG capsule Take 1 capsule (500 mg total) by mouth 3 (three) times daily. 21 capsule Nessa Ramaker K, PA-C      PDMP not reviewed this encounter.   Terrilee Croak, PA-C 03/28/21 1703

## 2021-03-28 NOTE — ED Triage Notes (Signed)
Pt c/o abscess to center upper back x3 days. Denies drainage at this time.

## 2021-03-28 NOTE — Discharge Instructions (Signed)
We drained your abscess today.  Start Keflex 500 mg 3 times a day.  Use Bactroban with dressing changes.  If you develop any worsening symptoms including fever, nausea, vomiting, recurrent abscess you need to be reevaluated immediately.  Please follow-up with your primary care provider within 1 week to ensure symptom improvement.

## 2021-07-23 ENCOUNTER — Ambulatory Visit (INDEPENDENT_AMBULATORY_CARE_PROVIDER_SITE_OTHER): Payer: Medicaid Other | Admitting: *Deleted

## 2021-07-23 ENCOUNTER — Other Ambulatory Visit (HOSPITAL_COMMUNITY)
Admission: RE | Admit: 2021-07-23 | Discharge: 2021-07-23 | Disposition: A | Discharge status: Home / Self Care | Payer: Medicaid Other | Source: Ambulatory Visit | Attending: Obstetrics and Gynecology | Admitting: Obstetrics and Gynecology

## 2021-07-23 ENCOUNTER — Other Ambulatory Visit: Payer: Self-pay

## 2021-07-23 VITALS — BP 132/83 | HR 75

## 2021-07-23 DIAGNOSIS — N898 Other specified noninflammatory disorders of vagina: Secondary | ICD-10-CM | POA: Insufficient documentation

## 2021-07-23 DIAGNOSIS — Z3202 Encounter for pregnancy test, result negative: Secondary | ICD-10-CM | POA: Diagnosis not present

## 2021-07-23 LAB — POCT URINE PREGNANCY: Preg Test, Ur: NEGATIVE

## 2021-07-23 NOTE — Progress Notes (Signed)
Ms. Arena presents today for UPT. She has no unusual complaints. LMP: 06/27/21    OBJECTIVE: Appears well, in no apparent distress.  OB History     Gravida  2   Para  1   Term      Preterm  1   AB  1   Living  1      SAB      IAB  1   Ectopic      Multiple  0   Live Births  1          Home UPT Result: NA In-Office UPT result:Negative I have reviewed the patient's medical, obstetrical, social, and family histories, and medications.   ASSESSMENT: Negative pregnancy test  PLAN Repeat UPT at home if menses late or signs of pregnancy    Odor: No. Fever: No. Pelvic Pain: No.  Itching: No. Dyspareunia: Yes.   Desires GC/CT: Yes  Thin: No. History of PID: No. Desires HIV,RPR,HbsAG: No.  Thick: No. History of STD: No. Other: NA

## 2021-07-25 LAB — CERVICOVAGINAL ANCILLARY ONLY
Bacterial Vaginitis (gardnerella): POSITIVE — AB
Candida Glabrata: NEGATIVE
Candida Vaginitis: NEGATIVE
Chlamydia: NEGATIVE
Comment: NEGATIVE
Comment: NEGATIVE
Comment: NEGATIVE
Comment: NEGATIVE
Comment: NEGATIVE
Comment: NORMAL
Neisseria Gonorrhea: NEGATIVE
Trichomonas: NEGATIVE

## 2021-07-28 MED ORDER — METRONIDAZOLE 500 MG PO TABS: 500.0000 mg | ORAL_TABLET | Freq: Two times a day (BID) | ORAL | 0 refills | Status: DC

## 2021-07-28 NOTE — Addendum Note (Signed)
Addended by: Mora Bellman on: 07/28/2021 08:57 AM   Modules accepted: Orders

## 2021-12-17 ENCOUNTER — Emergency Department (INDEPENDENT_AMBULATORY_CARE_PROVIDER_SITE_OTHER)
Admission: EM | Admit: 2021-12-17 | Discharge: 2021-12-17 | Disposition: A | Discharge status: Home / Self Care | Payer: Medicaid Other | Source: Home / Self Care | Attending: Family Medicine | Admitting: Family Medicine

## 2021-12-17 ENCOUNTER — Encounter: Payer: Self-pay | Admitting: Emergency Medicine

## 2021-12-17 DIAGNOSIS — L700 Acne vulgaris: Secondary | ICD-10-CM

## 2021-12-17 MED ORDER — DOXYCYCLINE HYCLATE 100 MG PO CAPS: 100.0000 mg | ORAL_CAPSULE | Freq: Two times a day (BID) | ORAL | 1 refills | Status: DC

## 2021-12-17 NOTE — Discharge Instructions (Signed)
Take the antibiotic 2 times a day for a week.  It is important to take this antibiotic with food ?Scrub your back with an antibacterial soap and apply acne medicine, like benzoyl peroxide ?See dermatology for follow-up ?

## 2021-12-17 NOTE — ED Provider Notes (Signed)
?[facility] ? ? ? ?CSN: [id number] ?Arrival date & time: 12/17/21  1747 ? ? ?  ? ?History   ?Chief Complaint ?Chief Complaint  ?Patient presents with  ? cyst  ? ? ?HPI ?[name] is a 26 y.o. female.  ? ?HPI ? ?Patient has history of infections in her skin.  She mostly has them on her back.  Is been going on for many years.  She is here for evaluation.  She currently has multiple nodules that are painful.  One of them is draining.  She has had to have an I&D performed in the past. ? ?Past Medical History:  ?Diagnosis Date  ? Anemia   ? Asthma   ? Chlamydia   ? age 28  ? Infection   ? UTI  ? ? ?Patient Active Problem List  ? Diagnosis Date Noted  ? Severe pre-eclampsia 04/23/2020  ? Indication for care in labor or delivery 04/23/2020  ? Gestational diabetes mellitus (GDM) in third trimester 03/05/2020  ? Pyelonephritis affecting pregnancy 01/14/2020  ? Asthma   ? Tobacco abuse 04/23/2016  ? Marijuana abuse 04/23/2016  ? Iron deficiency anemia 04/18/2016  ?  Class: Acute  ? ? ?Past Surgical History:  ?Procedure Laterality Date  ? TONSILLECTOMY    ? ? ?OB History   ? ? Gravida  ?2  ? Para  ?1  ? Term  ?   ? Preterm  ?1  ? AB  ?1  ? Living  ?1  ?  ? ? SAB  ?   ? IAB  ?1  ? Ectopic  ?   ? Multiple  ?0  ? Live Births  ?1  ?   ?  ?  ? ? ? ?Home Medications   ? ?Prior to Admission medications   ?Medication Sig Start Date End Date Taking? Authorizing Provider  ?doxycycline (VIBRAMYCIN) 100 MG capsule Take 1 capsule (100 mg total) by mouth 2 (two) times daily. 12/17/21  Yes Raylene Everts, MD  ? ? ?Family History ?Family History  ?Problem Relation Age of Onset  ? Healthy Mother   ? Healthy Father   ? Heart disease Paternal Grandmother   ? Stroke Paternal Grandmother   ? Thyroid disease Paternal Grandfather   ? ? ?Social History ?Social History  ? ?Tobacco Use  ? Smoking status: Former  ?  Packs/day: 0.25  ?  Years: 1.00  ?  Pack years: 0.25  ?  Types: Cigars, Cigarettes  ? Smokeless tobacco: Never  ?  Tobacco comments:  ?  black and mild  ?Vaping Use  ? Vaping Use: Never used  ?Substance Use Topics  ? Alcohol use: Not Currently  ? Drug use: Not Currently  ?  Types: Marijuana  ? ? ? ?Allergies   ?Patient has no known allergies. ? ? ?Review of Systems ?Review of Systems ?See HPI ? ?Physical Exam ?Triage Vital Signs ?ED Triage Vitals  ?Enc Vitals Group  ?   BP 12/17/21 1812 124/86  ?   Pulse Rate 12/17/21 1812 98  ?   Resp 12/17/21 1812 16  ?   Temp 12/17/21 1812 99.6 ?F (37.6 ?C)  ?   Temp Source 12/17/21 1812 Oral  ?   SpO2 12/17/21 1812 99 %  ?   Weight 12/17/21 1814 150 lb (68 kg)  ?   Height 12/17/21 1814 '5\' 4"'$  (1.626 m)  ?   Head Circumference --   ?   Peak Flow --   ?  Pain Score 12/17/21 1814 4  ?   Pain Loc --   ?   Pain Edu? --   ?   Excl. in [place]? --   ? ?No data found. ? ?Updated Vital Signs ?BP 124/86 (BP Location: Right Arm)   Pulse 98   Temp 99.6 ?F (37.6 ?C) (Oral)   Resp 16   Ht '5\' 4"'$  (1.626 m)   Wt 68 kg   LMP 11/15/2021 (Exact Date)   SpO2 99%   Breastfeeding No   BMI 25.75 kg/m?  ?   ? ?Physical Exam ?Constitutional:   ?   General: She is not in acute distress. ?   Appearance: Normal appearance. She is well-developed and normal weight.  ?HENT:  ?   Head: Normocephalic and atraumatic.  ?Eyes:  ?   Conjunctiva/sclera: Conjunctivae normal.  ?   Pupils: Pupils are equal, round, and reactive to light.  ?Cardiovascular:  ?   Rate and Rhythm: Normal rate.  ?Pulmonary:  ?   Effort: Pulmonary effort is normal. No respiratory distress.  ?Abdominal:  ?   General: There is no distension.  ?   Palpations: Abdomen is soft.  ?Musculoskeletal:     ?   General: Normal range of motion.  ?   Cervical back: Normal range of motion.  ?Skin: ?   General: Skin is warm and dry.  ? ?    ?Neurological:  ?   General: No focal deficit present.  ?   Mental Status: She is alert.  ?Psychiatric:     ?   Mood and Affect: Mood normal.     ?   Behavior: Behavior normal.  ? ? ? ?UC Treatments / Results  ?Labs ?(all labs  ordered are listed, but only abnormal results are displayed) ?Labs Reviewed - No data to display ? ?EKG ? ? ?Radiology ?No results found. ? ?Procedures ?Procedures (including critical care time) ? ?Medications Ordered in UC ?Medications - No data to display ? ?Initial Impression / Assessment and Plan / UC Course  ?I have reviewed the triage vital signs and the nursing notes. ? ?Pertinent labs & imaging results that were available during my care of the patient were reviewed by me and considered in my medical decision making (see chart for details). ? ?  ? ?Recommend dermatology referral.  We also help patient choose PCP. ?Final Clinical Impressions(s) / UC Diagnoses  ? ?Final diagnoses:  ?Nodulocystic acne  ? ? ? ?Discharge Instructions   ? ?  ?Take the antibiotic 2 times a day for a week.  It is important to take this antibiotic with food ?Scrub your back with an antibacterial soap and apply acne medicine, like benzoyl peroxide ?See dermatology for follow-up ? ? ?ED Prescriptions   ? ? Medication Sig Dispense Auth. Provider  ? doxycycline (VIBRAMYCIN) 100 MG capsule Take 1 capsule (100 mg total) by mouth 2 (two) times daily. 14 capsule Raylene Everts, MD  ? ?  ? ?PDMP not reviewed this encounter. ?  ?Raylene Everts, MD ?12/17/21 1856 ? ?

## 2021-12-17 NOTE — ED Triage Notes (Signed)
History of cysts  ?Pt note done to the left  scapulae x 1 week - started draining last night  ?Denies fever  ? ?

## 2021-12-24 ENCOUNTER — Emergency Department (HOSPITAL_COMMUNITY)
Admission: EM | Admit: 2021-12-24 | Discharge: 2021-12-25 | Disposition: A | Discharge status: Home / Self Care | Payer: Medicaid Other | Attending: Emergency Medicine | Admitting: Emergency Medicine

## 2021-12-24 ENCOUNTER — Encounter (HOSPITAL_COMMUNITY): Payer: Self-pay | Admitting: Emergency Medicine

## 2021-12-24 ENCOUNTER — Other Ambulatory Visit: Payer: Self-pay

## 2021-12-24 DIAGNOSIS — R1110 Vomiting, unspecified: Secondary | ICD-10-CM | POA: Diagnosis not present

## 2021-12-24 DIAGNOSIS — R1011 Right upper quadrant pain: Secondary | ICD-10-CM | POA: Diagnosis not present

## 2021-12-24 DIAGNOSIS — R718 Other abnormality of red blood cells: Secondary | ICD-10-CM | POA: Diagnosis not present

## 2021-12-24 DIAGNOSIS — R1010 Upper abdominal pain, unspecified: Secondary | ICD-10-CM | POA: Diagnosis present

## 2021-12-24 LAB — CBC WITH DIFFERENTIAL/PLATELET
Abs Immature Granulocytes: 0.03 10*3/uL (ref 0.00–0.07)
Basophils Absolute: 0 10*3/uL (ref 0.0–0.1)
Basophils Relative: 1 %
Eosinophils Absolute: 0.1 10*3/uL (ref 0.0–0.5)
Eosinophils Relative: 1 %
HCT: 44.8 % (ref 36.0–46.0)
Hemoglobin: 15.6 g/dL — ABNORMAL HIGH (ref 12.0–15.0)
Immature Granulocytes: 0 %
Lymphocytes Relative: 27 %
Lymphs Abs: 1.8 10*3/uL (ref 0.7–4.0)
MCH: 33.3 pg (ref 26.0–34.0)
MCHC: 34.8 g/dL (ref 30.0–36.0)
MCV: 95.5 fL (ref 80.0–100.0)
Monocytes Absolute: 0.8 10*3/uL (ref 0.1–1.0)
Monocytes Relative: 11 %
Neutro Abs: 4.2 10*3/uL (ref 1.7–7.7)
Neutrophils Relative %: 60 %
Platelets: 355 10*3/uL (ref 150–400)
RBC: 4.69 MIL/uL (ref 3.87–5.11)
RDW: 13.6 % (ref 11.5–15.5)
WBC: 6.9 10*3/uL (ref 4.0–10.5)
nRBC: 0 % (ref 0.0–0.2)

## 2021-12-24 LAB — I-STAT BETA HCG BLOOD, ED (MC, WL, AP ONLY): I-stat hCG, quantitative: <5 m[IU]/mL (ref <5)

## 2021-12-24 LAB — URINALYSIS, ROUTINE W REFLEX MICROSCOPIC
Bilirubin Urine: NEGATIVE
Glucose, UA: NEGATIVE mg/dL
Hgb urine dipstick: NEGATIVE
Ketones, ur: 5 mg/dL — AB
Leukocytes,Ua: NEGATIVE
Nitrite: NEGATIVE
Protein, ur: NEGATIVE mg/dL
Specific Gravity, Urine: 1.011 (ref 1.005–1.030)
pH: 7 (ref 5.0–8.0)

## 2021-12-24 LAB — COMPREHENSIVE METABOLIC PANEL
ALT: 15 U/L (ref 0–44)
AST: 19 U/L (ref 15–41)
Albumin: 3.8 g/dL (ref 3.5–5.0)
Alkaline Phosphatase: 58 U/L (ref 38–126)
Anion gap: 8 (ref 5–15)
BUN: 6 mg/dL (ref 6–20)
CO2: 21 mmol/L — ABNORMAL LOW (ref 22–32)
Calcium: 8.9 mg/dL (ref 8.9–10.3)
Chloride: 106 mmol/L (ref 98–111)
Creatinine, Ser: 0.78 mg/dL (ref 0.44–1.00)
GFR, Estimated: >60 mL/min (ref >60)
Glucose, Bld: 98 mg/dL (ref 70–99)
Potassium: 3.6 mmol/L (ref 3.5–5.1)
Sodium: 135 mmol/L (ref 135–145)
Total Bilirubin: 0.7 mg/dL (ref 0.3–1.2)
Total Protein: 7.2 g/dL (ref 6.5–8.1)

## 2021-12-24 LAB — LIPASE, BLOOD: Lipase: 30 U/L (ref 11–51)

## 2021-12-24 NOTE — ED Triage Notes (Signed)
26 yo presents to ED c/o up right and left upper quadrant abdominal pain x 8 hours. Pt rates the pain as 7/10 and states increases with deep breathing. Pt reports 3 episodes of emesis yesterday but states she did not have associated abdominal pain until today. Pt denies any episodes of emesis today. Pt denies fever but endorses chills. Pt denies urinary or bowel complaints at this time.  ?

## 2021-12-25 ENCOUNTER — Emergency Department (HOSPITAL_COMMUNITY): Payer: Medicaid Other

## 2021-12-25 NOTE — ED Provider Notes (Signed)
?[facility] DEPT ?Provider Note ? ? ?CSN: [id number] ?Arrival date & time: 12/24/21  2142 ? ?  ? ?History ? ?Chief Complaint  ?Patient presents with  ? Abdominal Pain  ? ? ?Stacy Warner is a 26 y.o. female who presents today for evaluation of abdominal pain. ?She states that yesterday she had 3 episodes of emesis but does not have any pain until today.  She has not had additional episodes of emesis today.  She denies any fevers but does report occasional chills.  She has been drinking water today, denies any other p.o. intake.  Her pain is in her upper abdomen.  She does not have any chest pain, cough, or shortness of breath.  No diarrhea or constipation.  No dysuria, frequency or urgency. ? ?HPI ? ?  ? ?Home Medications ?Prior to Admission medications   ?Medication Sig Start Date End Date Taking? Authorizing Provider  ?doxycycline (VIBRAMYCIN) 100 MG capsule Take 1 capsule (100 mg total) by mouth 2 (two) times daily. 12/17/21   Raylene Everts, MD  ?   ? ?Allergies    ?Patient has no known allergies.   ? ?Review of Systems   ?Review of Systems ? ?Physical Exam ?Updated Vital Signs ?BP 118/84   Pulse 85   Temp 99 ?F (37.2 ?C) (Oral)   Resp 14   LMP 12/19/2021 (Exact Date)   SpO2 98%  ?Physical Exam ?Vitals and nursing note reviewed.  ?Constitutional:   ?   General: She is not in acute distress. ?   Appearance: She is not ill-appearing.  ?HENT:  ?   Head: Normocephalic and atraumatic.  ?Eyes:  ?   Conjunctiva/sclera: Conjunctivae normal.  ?Cardiovascular:  ?   Rate and Rhythm: Normal rate and regular rhythm.  ?   Heart sounds: Normal heart sounds. No murmur heard. ?Pulmonary:  ?   Effort: Pulmonary effort is normal. No respiratory distress.  ?Abdominal:  ?   General: Abdomen is flat. There is no distension.  ?   Palpations: Abdomen is soft.  ?   Tenderness: There is abdominal tenderness in the right upper quadrant.  ?Musculoskeletal:  ?   Cervical back: Normal range of motion  and neck supple.  ?   Comments: No obvious acute injury  ?Skin: ?   General: Skin is warm.  ?Neurological:  ?   Mental Status: She is alert.  ?   Comments: Awake and alert, answers all questions appropriately.  Speech is not slurred.  ?Psychiatric:     ?   Mood and Affect: Mood normal.     ?   Behavior: Behavior normal.  ? ? ?ED Results / Procedures / Treatments   ?Labs ?(all labs ordered are listed, but only abnormal results are displayed) ?Labs Reviewed  ?COMPREHENSIVE METABOLIC PANEL - Abnormal; Notable for the following components:  ?    Result Value  ? CO2 21 (*)   ? All other components within normal limits  ?CBC WITH DIFFERENTIAL/PLATELET - Abnormal; Notable for the following components:  ? Hemoglobin 15.6 (*)   ? All other components within normal limits  ?URINALYSIS, ROUTINE W REFLEX MICROSCOPIC - Abnormal; Notable for the following components:  ? Ketones, ur 5 (*)   ? All other components within normal limits  ?LIPASE, BLOOD  ?I-STAT BETA HCG BLOOD, ED ([facility], [facility], [facility] ONLY)  ? ? ?EKG ?None ? ?Radiology ?US Abdomen Limited RUQ (LIVER/GB) ? ?Result Date: 12/25/2021 ?CLINICAL DATA:  Right upper quadrant pain EXAM: ULTRASOUND  ABDOMEN LIMITED RIGHT UPPER QUADRANT COMPARISON:  None. FINDINGS: Gallbladder: No gallstones or wall thickening visualized. No sonographic Murphy sign noted by sonographer. Common bile duct: Diameter: 2 mm Liver: No focal lesion identified. Within normal limits in parenchymal echogenicity. Portal vein is patent on color Doppler imaging with normal direction of blood flow towards the liver. Other: None. IMPRESSION: Negative right upper quadrant abdominal ultrasound Electronically Signed   By: Donavan Foil M.D.   On: 12/25/2021 01:22   ? ?Procedures ?Procedures  ? ? ?Medications Ordered in ED ?Medications - No data to display ? ?ED Course/ Medical Decision Making/ A&P ?Clinical Course as of 12/26/21 0731  ?Thu Dec 25, 2021  ?0240 Patient is reevaluated, she has tolerated water.  She is given  crackers. [EH]  ?0339 Patient is reevaluated, she has tolerated crackers and states she is ready to go home. [EH]  ?  ?Clinical Course User Index ?[EH] Lorin Glass, PA-C  ? ?                        ?Medical Decision Making ?Patient is a 26 year old woman who presents today for evaluation of 3 episodes of emesis yesterday and developed abdominal pain today. ? ?Labs are obtained and reviewed, please see below. ?With pain localized in the right upper quadrant ultrasound is ordered. ?Right upper quadrant ultrasound without cause for patient's symptoms found. ? ?On repeat abdominal exam she does not have an acute abdomen. ? ?I suspect viral GI illness.  ? ?Given that she tolerated PO intake low suspicion for obstruction.  ? ?She is well appearing.  ? ? ? ?While in the emergency room patient was able to tolerate p.o. intake without medications. ?She is reevaluated.  She stated she was ready to go home. ? ? ? ?Amount and/or Complexity of Data Reviewed ?Independent Historian: friend ?Labs: ordered. ?   Details: CBC shows mild elevated hemoglobin.  Lipase and CMP are unremarkable. ?UA without evidence of infection.  Pregnancy test is negative. ?Radiology: ordered. ?   Details: RUQ ultrasound with out abnormalitiy or cause for her symptoms ? ?Risk ?OTC drugs. ?Decision regarding hospitalization. ? ? ?Return precautions were discussed with patient who states their understanding.  At the time of discharge patient denied any unaddressed complaints or concerns.  Patient is agreeable for discharge home. ? ?Note: Portions of this report may have been transcribed using voice recognition software. Every effort was made to ensure accuracy; however, inadvertent computerized transcription errors may be present ? ? ? ? ? ? ? ? ?Final Clinical Impression(s) / ED Diagnoses ?Final diagnoses:  ?Right upper quadrant abdominal pain  ? ? ?Rx / DC Orders ?ED Discharge Orders   ? ? None  ? ?  ? ? ?  ?Lorin Glass, [place] ?12/26/21  2778 ? ?  ?Lajean Saver, MD ?12/29/21 1340 ? ?

## 2021-12-25 NOTE — ED Notes (Signed)
Pt drinking water for PO challenge

## 2021-12-25 NOTE — Discharge Instructions (Signed)
Please follow a bland diet for the next few days.  ?If you develop fevers, chest pain, shortness of breath, or have any new or concerning symptoms please seek additional medical care and evaluation. ?

## 2022-05-08 ENCOUNTER — Encounter: Payer: Self-pay | Admitting: Emergency Medicine

## 2022-05-08 ENCOUNTER — Ambulatory Visit
Admission: EM | Admit: 2022-05-08 | Discharge: 2022-05-08 | Disposition: A | Discharge status: Home / Self Care | Payer: Medicaid Other

## 2022-05-08 DIAGNOSIS — M62838 Other muscle spasm: Secondary | ICD-10-CM

## 2022-05-08 DIAGNOSIS — S161XXA Strain of muscle, fascia and tendon at neck level, initial encounter: Secondary | ICD-10-CM | POA: Diagnosis not present

## 2022-05-08 MED ORDER — PREDNISONE 10 MG (21) PO TBPK: ORAL_TABLET | Freq: Every day | ORAL | 0 refills | Status: DC

## 2022-05-08 MED ORDER — METHOCARBAMOL 500 MG PO TABS: 500.0000 mg | ORAL_TABLET | Freq: Three times a day (TID) | ORAL | 0 refills | Status: DC | PRN

## 2022-05-08 NOTE — ED Triage Notes (Signed)
Left sided neck pain at night feels stiff x 4 days  Pain radiates to left arm - stops above elbow Also c/o  numbness to 3 fingers on left hand Fingers are  small, ring  & middle Tiger balm helped yesterday  Ibuprofen '400mg'$  at 1200 &  2 aleve at 0200 Both meds helped with pain  Denies  any injury

## 2022-05-08 NOTE — Discharge Instructions (Addendum)
Advised patient to take medication as directed with food to completion.  Instructed patient to start Stacy Warner tomorrow morning Saturday, 05/09/2022.  Advised patient may take Robaxin daily or as needed for accompanying muscle spasms of neck/trapezius muscles bilaterally.  Encouraged patient to increase daily water intake while taking these medications.  Advised if symptoms worsen and/or unresolved please follow-up with PCP or here for further evaluation.

## 2022-05-08 NOTE — ED Provider Notes (Signed)
[facility]    CSN: [id number] Arrival date & time: 05/08/22  1800      History   Chief Complaint Chief Complaint  Patient presents with   Neck Pain    HPI Stacy Warner is a 26 y.o. female.   HPI 26 year old female presents with neck pain left-sided for 4 days.  Reports radiates to left arm and stops above elbow.  However, reports some numbness of 3 fingers on left hand.  Reports OTC Tiger balm helped yesterday with pain.  PMH significant for anemia, marijuana abuse, and asthma.  Past Medical History:  Diagnosis Date   Anemia    Asthma    Chlamydia    age 39   Infection    UTI    Patient Active Problem List   Diagnosis Date Noted   Severe pre-eclampsia 04/23/2020   Indication for care in labor or delivery 04/23/2020   Gestational diabetes mellitus (GDM) in third trimester 03/05/2020   Pyelonephritis affecting pregnancy 01/14/2020   Asthma    Tobacco abuse 04/23/2016   Marijuana abuse 04/23/2016   Iron deficiency anemia 04/18/2016    Class: Acute    Past Surgical History:  Procedure Laterality Date   TONSILLECTOMY      OB History     Gravida  2   Para  1   Term      Preterm  1   AB  1   Living  1      SAB      IAB  1   Ectopic      Multiple  0   Live Births  1            Home Medications    Prior to Admission medications   Medication Sig Start Date End Date Taking? Authorizing Provider  methocarbamol (ROBAXIN) 500 MG tablet Take 1 tablet (500 mg total) by mouth 3 (three) times daily as needed for muscle spasms. 05/08/22  Yes Eliezer Lofts, FNP  predniSONE (STERAPRED UNI-PAK 21 TAB) 10 MG (21) TBPK tablet Take by mouth daily. Take 6 tabs by mouth daily  for 2 days, then 5 tabs for 2 days, then 4 tabs for 2 days, then 3 tabs for 2 days, 2 tabs for 2 days, then 1 tab by mouth daily for 2 days 05/08/22  Yes Eliezer Lofts, FNP  doxycycline (VIBRAMYCIN) 100 MG capsule Take 1 capsule (100 mg total) by mouth 2 (two) times  daily. Patient not taking: Reported on 05/08/2022 12/17/21   Raylene Everts, MD  ketoconazole (NIZORAL) 2 % cream Apply topically 2 (two) times daily as needed. 04/21/22   [provider]  ketoconazole (NIZORAL) 2 % shampoo Apply topically daily. 04/21/22   [provider]  TRI-LO-MARZIA 0.18/0.215/0.25 MG-25 MCG tab Take 1 tablet by mouth daily. 04/21/22   [provider]    Family History Family History  Problem Relation Age of Onset   Healthy Mother    Healthy Father    Heart disease Paternal Grandmother    Stroke Paternal Grandmother    Thyroid disease Paternal Grandfather     Social History Social History   Tobacco Use   Smoking status: Former    Packs/day: 0.25    Years: 1.00    Total pack years: 0.25    Types: Cigars, Cigarettes   Smokeless tobacco: Never   Tobacco comments:    black and mild  Vaping Use   Vaping Use: Never used  Substance Use Topics  Alcohol use: Not Currently   Drug use: Not Currently    Types: Marijuana     Allergies   Patient has no known allergies.   Review of Systems Review of Systems  Musculoskeletal:  Positive for neck pain.     Physical Exam Triage Vital Signs ED Triage Vitals  Enc Vitals Group     BP 05/08/22 1824 117/88     Pulse Rate 05/08/22 1824 64     Resp 05/08/22 1824 16     Temp 05/08/22 1824 99.5 F (37.5 C)     Temp Source 05/08/22 1824 Oral     SpO2 05/08/22 1824 99 %     Weight 05/08/22 1826 154 lb (69.9 kg)     Height 05/08/22 1826 '5\' 4"'$  (1.626 m)     Head Circumference --      Peak Flow --      Pain Score 05/08/22 1826 4     Pain Loc --      Pain Edu? --      Excl. in [place]? --    No data found.  Updated Vital Signs BP 117/88 (BP Location: Left Arm)   Pulse 64   Temp 99.5 F (37.5 C) (Oral)   Resp 16   Ht '5\' 4"'$  (1.626 m)   Wt 154 lb (69.9 kg)   LMP 04/17/2022 (Exact Date)   SpO2 99%   BMI 26.43 kg/m    Physical Exam Vitals and nursing note reviewed.   Constitutional:      Appearance: Normal appearance. She is normal weight.  HENT:     Head: Normocephalic and atraumatic.     Mouth/Throat:     Mouth: Mucous membranes are moist.     Pharynx: Oropharynx is clear.  Eyes:     Extraocular Movements: Extraocular movements intact.     Conjunctiva/sclera: Conjunctivae normal.     Pupils: Pupils are equal, round, and reactive to light.  Neck:     Comments: Limited range of motion with 4 planes of movement; TTP over bilateral trapezius, rhomboids with palpable muscle adhesions noted Cardiovascular:     Rate and Rhythm: Normal rate and regular rhythm.     Pulses: Normal pulses.     Heart sounds: Normal heart sounds. No murmur heard. Pulmonary:     Effort: Pulmonary effort is normal.     Breath sounds: Normal breath sounds. No wheezing, rhonchi or rales.  Musculoskeletal:     Cervical back: Normal range of motion and neck supple.  Skin:    General: Skin is warm and dry.  Neurological:     General: No focal deficit present.     Mental Status: She is alert and oriented to person, place, and time.      UC Treatments / Results  Labs (all labs ordered are listed, but only abnormal results are displayed) Labs Reviewed - No data to display  EKG   Radiology No results found.  Procedures Procedures (including critical care time)  Medications Ordered in UC Medications - No data to display  Initial Impression / Assessment and Plan / UC Course  I have reviewed the triage vital signs and the nursing notes.  Pertinent labs & imaging results that were available during my care of the patient were reviewed by me and considered in my medical decision making (see chart for details).     MDM: 1.  Strain of neck muscle, initial encounter-Rx'd Sterapred Unipak; 2.  Trapezius muscle spasm-Rx'd Robaxin. Advised patient to take medication  as directed with food to completion.  Instructed patient to start Esther Hardy tomorrow morning  Saturday, 05/09/2022.  Advised patient may take Robaxin daily or as needed for accompanying muscle spasms of neck/trapezius muscles bilaterally.  Encouraged patient to increase daily water intake while taking these medications.  Advised if symptoms worsen and/or unresolved please follow-up with PCP or here for further evaluation.  Patient discharged home, hemodynamically stable. Final Clinical Impressions(s) / UC Diagnoses   Final diagnoses:  Trapezius muscle spasm  Strain of neck muscle, initial encounter     Discharge Instructions      Advised patient to take medication as directed with food to completion.  Instructed patient to start Esther Hardy tomorrow morning Saturday, 05/09/2022.  Advised patient may take Robaxin daily or as needed for accompanying muscle spasms of neck/trapezius muscles bilaterally.  Encouraged patient to increase daily water intake while taking these medications.  Advised if symptoms worsen and/or unresolved please follow-up with PCP or here for further evaluation.     ED Prescriptions     Medication Sig Dispense Auth. Provider   predniSONE (STERAPRED UNI-PAK 21 TAB) 10 MG (21) TBPK tablet Take by mouth daily. Take 6 tabs by mouth daily  for 2 days, then 5 tabs for 2 days, then 4 tabs for 2 days, then 3 tabs for 2 days, 2 tabs for 2 days, then 1 tab by mouth daily for 2 days 42 tablet Eliezer Lofts, FNP   methocarbamol (ROBAXIN) 500 MG tablet Take 1 tablet (500 mg total) by mouth 3 (three) times daily as needed for muscle spasms. 30 tablet Eliezer Lofts, FNP      PDMP not reviewed this encounter.   Eliezer Lofts, [place] 05/08/22 1919

## 2022-05-09 ENCOUNTER — Telehealth: Payer: Self-pay

## 2022-05-09 NOTE — Telephone Encounter (Signed)
Called pt to f/u regarding recent visit. Pt reported feeling much better. Pt verbalized understanding of discharge instructions and reported no further questions or concerns at this time.

## 2022-09-15 ENCOUNTER — Encounter: Payer: Self-pay | Admitting: Obstetrics and Gynecology

## 2022-09-15 ENCOUNTER — Other Ambulatory Visit (HOSPITAL_COMMUNITY)
Admission: RE | Admit: 2022-09-15 | Discharge: 2022-09-15 | Disposition: A | Discharge status: Home / Self Care | Payer: BC Managed Care – PPO | Source: Ambulatory Visit | Attending: Obstetrics and Gynecology | Admitting: Obstetrics and Gynecology

## 2022-09-15 ENCOUNTER — Ambulatory Visit (INDEPENDENT_AMBULATORY_CARE_PROVIDER_SITE_OTHER): Payer: BC Managed Care – PPO | Admitting: Obstetrics and Gynecology

## 2022-09-15 VITALS — BP 126/87 | HR 86 | Ht 64.0 in | Wt 154.0 lb

## 2022-09-15 DIAGNOSIS — Z7251 High risk heterosexual behavior: Secondary | ICD-10-CM | POA: Diagnosis not present

## 2022-09-15 DIAGNOSIS — Z113 Encounter for screening for infections with a predominantly sexual mode of transmission: Secondary | ICD-10-CM | POA: Diagnosis not present

## 2022-09-15 LAB — POCT URINE PREGNANCY: Preg Test, Ur: NEGATIVE

## 2022-09-15 NOTE — Progress Notes (Signed)
    GYNECOLOGY ENCOUNTER NOTE  History:     Stacy Warner is a 27 y.o. [phone] female here for STD testing. She has no other complaints. Plans to schedule annual with pap soon.    Gynecologic History Patient's last menstrual period was 09/02/2022.   Obstetric History OB History  Gravida Para Term Preterm AB Living  '2 1   1 1 1  '$ SAB IAB Ectopic Multiple Live Births    1   0 1    # Outcome Date GA Lbr Len/2nd Weight Sex Delivery Anes PTL Lv  2 Preterm 04/24/20 [id number] / 00:19 4 lb 5.2 oz (1.961 kg) F Vag-Spont EPI  LIV  1 IAB 01/29/19            Past Medical History:  Diagnosis Date   Anemia    Asthma    Chlamydia    age 89  Infection    UTI    Past Surgical History:  Procedure Laterality Date   TONSILLECTOMY      No current outpatient medications on file prior to visit.   No current facility-administered medications on file prior to visit.    No Known Allergies  Social History:  reports that she has quit smoking. Her smoking use included cigars and cigarettes. She has a 0.25 pack-year smoking history. She has never used smokeless tobacco. She reports that she does not currently use alcohol. She reports that she does not currently use drugs after having used the following drugs: Marijuana.  Family History  Problem Relation Age of Onset   Healthy Mother    Healthy Father    Heart disease Paternal Grandmother    Stroke Paternal Grandmother    Thyroid disease Paternal Grandfather     The following portions of the patient's history were reviewed and updated as appropriate: allergies, current medications, past family history, past medical history, past social history, past surgical history and problem list.  Review of Systems Pertinent items noted in HPI and remainder of comprehensive ROS otherwise negative.  Physical Exam:  BP 126/87   Pulse 86   Ht '5\' 4"'$  (1.626 m)   Wt 154 lb (69.9 kg)   LMP 09/02/2022   BMI 26.43 kg/m  CONSTITUTIONAL:  Well-developed, well-nourished female in no acute distress.  HENT:  Normocephalic SKIN: Skin is warm and dry.  MUSCULOSKELETAL: Normal range of motion.  NEUROLOGIC: Alert and oriented to person, place, and time.  PELVIC: Normal appearing external genitalia and urethral meatus; normal appearing vaginal mucosa and cervix.  No abnormal vaginal discharge noted.    Assessment and Plan:    1. Routine screening for STI (sexually transmitted infection)  - Cervicovaginal ancillary only( [place]) - HIV antibody (with reflex) - Hepatitis C Antibody - Hepatitis B Surface AntiGEN - RPR  2. Unprotected sexual intercourse  POCT urine pregnancy: negative    Azura Tufaro, JArtist Pais NClayfor [employer] [place]

## 2022-09-16 LAB — CERVICOVAGINAL ANCILLARY ONLY
Chlamydia: NEGATIVE
Comment: NEGATIVE
Comment: NEGATIVE
Comment: NORMAL
Neisseria Gonorrhea: NEGATIVE
Trichomonas: NEGATIVE

## 2022-10-02 ENCOUNTER — Ambulatory Visit: Payer: BC Managed Care – PPO | Admitting: Obstetrics and Gynecology

## 2022-10-05 ENCOUNTER — Ambulatory Visit
Admission: RE | Admit: 2022-10-05 | Discharge: 2022-10-05 | Disposition: A | Discharge status: Home / Self Care | Payer: BC Managed Care – PPO | Source: Ambulatory Visit | Attending: Family Medicine | Admitting: Family Medicine

## 2022-10-05 VITALS — BP 128/84 | HR 77 | Temp 99.1°F | Resp 18

## 2022-10-05 DIAGNOSIS — H6501 Acute serous otitis media, right ear: Secondary | ICD-10-CM | POA: Diagnosis not present

## 2022-10-05 MED ORDER — CEFDINIR 300 MG PO CAPS: ORAL_CAPSULE | ORAL | 0 refills | Status: AC

## 2022-10-05 MED ORDER — PREDNISONE 20 MG PO TABS: ORAL_TABLET | ORAL | 0 refills | Status: AC

## 2022-10-05 NOTE — Discharge Instructions (Signed)
May take Pseudoephedrine ('30mg'$ , one or two every 4 to 6 hours) for sinus congestion.    May use Afrin nasal spray (or generic oxymetazoline) each morning for about 5 days and then discontinue.  Also recommend using saline nasal spray several times daily and saline nasal irrigation (AYR is a common brand).  Use Flonase nasal spray each morning after using Afrin nasal spray and saline nasal irrigation.

## 2022-10-05 NOTE — ED Provider Notes (Signed)
[name]    CSN: [id number] Arrival date & time: 10/05/22  1818      History   Chief Complaint Chief Complaint  Patient presents with   Ear Fullness    RT, APPT 7PM    HPI Stacy Warner is a 27 y.o. female.   Patient complains of a sensation of fullness (without pain) in her right ear for one week.  She has difficulty equalizing pressure in her right ear.  She has had mild nasal congestion but feels well otherwise.  She denies recent URI.  The history is provided by the patient.    Past Medical History:  Diagnosis Date   Anemia    Asthma    Chlamydia    age 57   Infection    UTI    Patient Active Problem List   Diagnosis Date Noted   Severe pre-eclampsia 04/23/2020   Indication for care in labor or delivery 04/23/2020   Gestational diabetes mellitus (GDM) in third trimester 03/05/2020   Pyelonephritis affecting pregnancy 01/14/2020   Asthma    Tobacco abuse 04/23/2016   Marijuana abuse 04/23/2016   Iron deficiency anemia 04/18/2016    Class: Acute    Past Surgical History:  Procedure Laterality Date   TONSILLECTOMY      OB History     Gravida  2   Para  1   Term      Preterm  1   AB  1   Living  1      SAB      IAB  1   Ectopic      Multiple  0   Live Births  1            Home Medications    Prior to Admission medications   Medication Sig Start Date End Date Taking? Authorizing Provider  cefdinir (OMNICEF) 300 MG capsule Take one cap PO Q12hr 10/05/22  Yes Toya Palacios, Ishmael Holter, MD  predniSONE (DELTASONE) 20 MG tablet Take one tab by mouth twice daily for 3 days, then one daily. Take with food. 10/05/22  Yes Kandra Nicolas, MD    Family History Family History  Problem Relation Age of Onset   Healthy Mother    Healthy Father    Heart disease Paternal Grandmother    Stroke Paternal Grandmother    Thyroid disease Paternal Grandfather     Social History Social History   Tobacco Use   Smoking status: Former     Packs/day: 0.25    Years: 1.00    Total pack years: 0.25    Types: Cigars, Cigarettes   Smokeless tobacco: Never   Tobacco comments:    black and mild  Vaping Use   Vaping Use: Never used  Substance Use Topics   Alcohol use: Not Currently   Drug use: Not Currently    Types: Marijuana     Allergies   Patient has no known allergies.   Review of Systems Review of Systems No sore throat No cough No pleuritic pain No wheezing + nasal congestion No post-nasal drainage No sinus pain/pressure No itchy/red eyes ? right earache No hemoptysis No SOB No fever/chills No nausea No vomiting No abdominal pain No diarrhea No urinary symptoms No skin rash No fatigue No myalgias No headache   Physical Exam Triage Vital Signs ED Triage Vitals  Enc Vitals Group     BP 10/05/22 1901 128/84     Pulse Rate 10/05/22 1901 77  Resp 10/05/22 1901 18     Temp 10/05/22 1901 99.1 F (37.3 C)     Temp Source 10/05/22 1901 Oral     SpO2 10/05/22 1901 100 %     Weight --      Height --      Head Circumference --      Peak Flow --      Pain Score 10/05/22 1902 0     Pain Loc --      Pain Edu? --      Excl. in [place]? --    No data found.  Updated Vital Signs BP 128/84 (BP Location: Left Arm)   Pulse 77   Temp 99.1 F (37.3 C) (Oral)   Resp 18   LMP 09/29/2022 (Exact Date)   SpO2 100%   Visual Acuity Right Eye Distance:   Left Eye Distance:   Bilateral Distance:    Right Eye Near:   Left Eye Near:    Bilateral Near:     Physical Exam Vitals and nursing note reviewed.  Constitutional:      General: She is not in acute distress. HENT:     Head: Normocephalic.     Right Ear: Ear canal and external ear normal. There is no impacted cerumen.     Left Ear: Tympanic membrane, ear canal and external ear normal. There is no impacted cerumen.     Ears:     Comments: Right tympanic membrane has serous effusion present.    Nose: Congestion present.     Mouth/Throat:      Mouth: Mucous membranes are moist.     Pharynx: Oropharynx is clear.  Eyes:     Pupils: Pupils are equal, round, and reactive to light.  Cardiovascular:     Rate and Rhythm: Normal rate.  Pulmonary:     Effort: Pulmonary effort is normal.     Breath sounds: Normal breath sounds.  Musculoskeletal:     Cervical back: Neck supple.  Lymphadenopathy:     Cervical: No cervical adenopathy.  Skin:    General: Skin is warm and dry.  Neurological:     Mental Status: She is alert and oriented to person, place, and time.      UC Treatments / Results  Labs (all labs ordered are listed, but only abnormal results are displayed) Labs Reviewed - No data to display  EKG   Radiology No results found.  Procedures Procedures (including critical care time)  Medications Ordered in UC Medications - No data to display  Initial Impression / Assessment and Plan / UC Course  I have reviewed the triage vital signs and the nursing notes.  Pertinent labs & imaging results that were available during my care of the patient were reviewed by me and considered in my medical decision making (see chart for details).    Begin prednisone burst/taper and Omnicef. Followup with Family Doctor if not improved in one week.   Final Clinical Impressions(s) / UC Diagnoses   Final diagnoses:  Non-recurrent acute serous otitis media of right ear     Discharge Instructions      May take Pseudoephedrine ('30mg'$ , one or two every 4 to 6 hours) for sinus congestion.    May use Afrin nasal spray (or generic oxymetazoline) each morning for about 5 days and then discontinue.  Also recommend using saline nasal spray several times daily and saline nasal irrigation (AYR is a common brand).  Use Flonase nasal spray each morning after using Afrin  nasal spray and saline nasal irrigation.      ED Prescriptions     Medication Sig Dispense Auth. Provider   predniSONE (DELTASONE) 20 MG tablet Take one tab by mouth  twice daily for 3 days, then one daily. Take with food. 9 tablet Kandra Nicolas, MD   cefdinir (OMNICEF) 300 MG capsule Take one cap PO Q12hr 10 capsule Kandra Nicolas, MD         Kandra Nicolas, MD 10/06/22 [phone]

## 2022-10-05 NOTE — ED Triage Notes (Signed)
Pt c/o RT ear fullness x 1 week. Denies pain.

## 2022-10-06 ENCOUNTER — Ambulatory Visit: Payer: Self-pay

## 2022-10-09 ENCOUNTER — Ambulatory Visit: Payer: BC Managed Care – PPO | Admitting: Obstetrics and Gynecology

## 2022-12-07 DIAGNOSIS — R1030 Lower abdominal pain, unspecified: Secondary | ICD-10-CM | POA: Diagnosis not present

## 2023-06-24 ENCOUNTER — Ambulatory Visit: Payer: BC Managed Care – PPO | Admitting: Obstetrics and Gynecology

## 2023-06-24 NOTE — Progress Notes (Signed)
No show

## 2023-12-16 ENCOUNTER — Telehealth

## 2023-12-16 ENCOUNTER — Ambulatory Visit

## 2024-01-01 ENCOUNTER — Ambulatory Visit
Admission: RE | Admit: 2024-01-01 | Discharge: 2024-01-01 | Disposition: A | Discharge status: Home / Self Care | Source: Ambulatory Visit | Attending: Physician Assistant | Admitting: Physician Assistant

## 2024-01-01 ENCOUNTER — Other Ambulatory Visit: Payer: Self-pay

## 2024-01-01 VITALS — BP 121/71 | HR 78 | Temp 98.3°F | Resp 18 | Ht 64.0 in | Wt 140.0 lb

## 2024-01-01 DIAGNOSIS — R350 Frequency of micturition: Secondary | ICD-10-CM | POA: Insufficient documentation

## 2024-01-01 DIAGNOSIS — B379 Candidiasis, unspecified: Secondary | ICD-10-CM | POA: Insufficient documentation

## 2024-01-01 DIAGNOSIS — N390 Urinary tract infection, site not specified: Secondary | ICD-10-CM | POA: Diagnosis not present

## 2024-01-01 DIAGNOSIS — T3695XA Adverse effect of unspecified systemic antibiotic, initial encounter: Secondary | ICD-10-CM | POA: Diagnosis not present

## 2024-01-01 DIAGNOSIS — R319 Hematuria, unspecified: Secondary | ICD-10-CM | POA: Insufficient documentation

## 2024-01-01 LAB — POCT URINALYSIS DIP (MANUAL ENTRY)
Bilirubin, UA: NEGATIVE
Glucose, UA: NEGATIVE mg/dL
Ketones, POC UA: NEGATIVE mg/dL
Nitrite, UA: POSITIVE — AB
Protein Ur, POC: 100 mg/dL — AB
Spec Grav, UA: 1.02 (ref 1.010–1.025)
Urobilinogen, UA: 1 U/dL
pH, UA: 7 (ref 5.0–8.0)

## 2024-01-01 LAB — POCT URINE PREGNANCY: Preg Test, Ur: NEGATIVE

## 2024-01-01 MED ORDER — FLUCONAZOLE 150 MG PO TABS
150.0000 mg | ORAL_TABLET | ORAL | 0 refills | Status: AC | PRN
Start: 2024-01-01 — End: ?

## 2024-01-01 MED ORDER — NITROFURANTOIN MONOHYD MACRO 100 MG PO CAPS: 100.0000 mg | ORAL_CAPSULE | Freq: Two times a day (BID) | ORAL | 0 refills | Status: AC

## 2024-01-01 NOTE — ED Provider Notes (Signed)
 [facility]    CSN: [id number] Arrival date & time: 01/01/24  1125      History   Chief Complaint Chief Complaint  Patient presents with   Back Pain    Entered by patient   Urinary Frequency    HPI Stacy Warner is a 28 y.o. female.   HPI  She reports having lower back pain that started yesterday  She reports she has been having RLQ pain for about 2 weeks and increased urinary frequency but recurrent urinary hesitancy  She denies fevers, chills, gross hematuria She denies vaginal pain, bleeding or discharge She reports hx of vulvovaginal yeast infections with abx use   Past Medical History:  Diagnosis Date   Anemia    Asthma    Chlamydia    age 31   Infection    UTI    Patient Active Problem List   Diagnosis Date Noted   Severe pre-eclampsia 04/23/2020   Indication for care in labor or delivery 04/23/2020   Gestational diabetes mellitus (GDM) in third trimester 03/05/2020   Pyelonephritis affecting pregnancy 01/14/2020   Asthma    Tobacco abuse 04/23/2016   Marijuana abuse 04/23/2016   Iron  deficiency anemia 04/18/2016    Class: Acute    Past Surgical History:  Procedure Laterality Date   TONSILLECTOMY      OB History     Gravida  2   Para  1   Term      Preterm  1   AB  1   Living  1      SAB      IAB  1   Ectopic      Multiple  0   Live Births  1            Home Medications    Prior to Admission medications   Medication Sig Start Date End Date Taking? Authorizing Provider  fluconazole  (DIFLUCAN ) 150 MG tablet Take 1 tablet (150 mg total) by mouth every three (3) days as needed. May repeat in 3 days if symptoms not resolved 01/01/24  Yes Jobina Maita E, PA-C  nitrofurantoin, macrocrystal-monohydrate, (MACROBID) 100 MG capsule Take 1 capsule (100 mg total) by mouth 2 (two) times daily for 5 days. 01/01/24 01/06/24 Yes Gabrielly Mccrystal E, PA-C  cefdinir  (OMNICEF ) 300 MG capsule Take one cap PO Q12hr 10/05/22   Leon Rajas, MD  predniSONE  (DELTASONE ) 20 MG tablet Take one tab by mouth twice daily for 3 days, then one daily. Take with food. 10/05/22   Leon Rajas, MD    Family History Family History  Problem Relation Age of Onset   Healthy Mother    Healthy Father    Heart disease Paternal Grandmother    Stroke Paternal Grandmother    Thyroid disease Paternal Grandfather     Social History Social History   Tobacco Use   Smoking status: Former    Current packs/day: 0.25    Average packs/day: 0.3 packs/day for 1 year (0.3 ttl pk-yrs)    Types: Cigars, Cigarettes   Smokeless tobacco: Never   Tobacco comments:    black and mild  Vaping Use   Vaping status: Never Used  Substance Use Topics   Alcohol use: Not Currently   Drug use: Not Currently    Types: Marijuana     Allergies   Patient has no known allergies.   Review of Systems Review of Systems  Constitutional:  Negative for chills and fever.  Genitourinary:  Positive for dysuria, flank pain and frequency. Negative for difficulty urinating, hematuria, urgency, vaginal bleeding, vaginal discharge and vaginal pain.     Physical Exam Triage Vital Signs ED Triage Vitals  Encounter Vitals Group     BP 01/01/24 1154 121/71     Systolic BP Percentile --      Diastolic BP Percentile --      Pulse Rate 01/01/24 1154 78     Resp 01/01/24 1154 18     Temp 01/01/24 1154 98.3 F (36.8 C)     Temp Source 01/01/24 1154 Oral     SpO2 01/01/24 1154 97 %     Weight 01/01/24 1155 140 lb (63.5 kg)     Height 01/01/24 1155 5\' 4"  (1.626 m)     Head Circumference --      Peak Flow --      Pain Score 01/01/24 1154 3     Pain Loc --      Pain Education --      Exclude from Growth Chart --    No data found.  Updated Vital Signs BP 121/71 (BP Location: Right Arm)   Pulse 78   Temp 98.3 F (36.8 C) (Oral)   Resp 18   Ht 5\' 4"  (1.626 m)   Wt 140 lb (63.5 kg)   LMP 12/17/2023 (Exact Date)   SpO2 97%   BMI 24.03 kg/m    Visual Acuity Right Eye Distance:   Left Eye Distance:   Bilateral Distance:    Right Eye Near:   Left Eye Near:    Bilateral Near:     Physical Exam Vitals reviewed.  Constitutional:      General: She is awake.     Appearance: Normal appearance. She is well-developed and well-groomed.  HENT:     Head: Normocephalic and atraumatic.  Eyes:     General: Lids are normal. Gaze aligned appropriately.     Extraocular Movements: Extraocular movements intact.     Conjunctiva/sclera: Conjunctivae normal.  Pulmonary:     Effort: Pulmonary effort is normal.  Neurological:     General: No focal deficit present.     Mental Status: She is alert and oriented to person, place, and time.     GCS: GCS eye subscore is 4. GCS verbal subscore is 5. GCS motor subscore is 6.     Cranial Nerves: No cranial nerve deficit, dysarthria or facial asymmetry.  Psychiatric:        Attention and Perception: Attention and perception normal.        Mood and Affect: Mood and affect normal.        Speech: Speech normal.        Behavior: Behavior normal. Behavior is cooperative.        Thought Content: Thought content normal.        Judgment: Judgment normal.      UC Treatments / Results  Labs (all labs ordered are listed, but only abnormal results are displayed) Labs Reviewed  POCT URINALYSIS DIP (MANUAL ENTRY) - Abnormal; Notable for the following components:      Result Value   Color, UA orange (*)    Clarity, UA turbid (*)    Blood, UA moderate (*)    Protein Ur, POC =100 (*)    Nitrite, UA Positive (*)    Leukocytes, UA Small (1+) (*)    All other components within normal limits  URINE CULTURE  POCT URINE PREGNANCY  CERVICOVAGINAL ANCILLARY ONLY  EKG   Radiology No results found.  Procedures Procedures (including critical care time)  Medications Ordered in UC Medications - No data to display  Initial Impression / Assessment and Plan / UC Course  I have reviewed the triage  vital signs and the nursing notes.  Pertinent labs & imaging results that were available during my care of the patient were reviewed by me and considered in my medical decision making (see chart for details).      Final Clinical Impressions(s) / UC Diagnoses   Final diagnoses:  Urinary tract infection with hematuria, site unspecified  Urinary frequency  Antibiotic-induced yeast infection   Patient presents today with concerns for increased urinary frequency for the past 3 days.  She also reports that she has been having pains in her right lower abdominal area that radiates to the back for the past 2 weeks.  She reports concerns for potential UTI given previous similar symptoms.  Urine dip was positive for leukocytes, nitrites, blood.  Will send off urine culture for ID and susceptibility testing.  Will provide Macrobid p.o. twice daily x 5 days for management pending urine culture results.  Patient reports that she does get antibiotic induced yeast infection so we will also send in Diflucan .  Results of urine culture and cervicovaginal swab to dictate further management.  ED and return precautions reviewed and provided after visit summary.  Follow-up as needed.    Discharge Instructions      Based on your symptoms and results of the urinalysis I believe you have a UTI We have sent a urine sample off for a urine culture to determine the bacteria causing your UTI as well as the most appropriate medication for it.  We will keep you updated on those results as well as the results of your cervicovaginal swab once they are available. I recommend the following:  I have sent in a script for Macrobid (nitrofurantoin) to be taken by mouth twice per day for 5 days.  Please complete the entire course of medication unless a provider tells you to stop or if you develop an allergic reaction. Please finish the entire course of the antibiotic even if you are feeling better before it is completed. Stay well  hydrated (at least 75 oz of water per day) and avoid holding your urine I have also sent in a medication called Diflucan  to assist with antibiotic associated yeast infection.  Please take this at the first sign of symptoms and then save the second pill for when you are finished with your antibiotic to help prevent potential complications. If you have any of the following please let us  know: persistent symptoms, fever, trouble urinating or inability to urinate, confusion, flank pain.       ED Prescriptions     Medication Sig Dispense Auth. Provider   nitrofurantoin, macrocrystal-monohydrate, (MACROBID) 100 MG capsule Take 1 capsule (100 mg total) by mouth 2 (two) times daily for 5 days. 10 capsule Julizza Sassone E, PA-C   fluconazole  (DIFLUCAN ) 150 MG tablet Take 1 tablet (150 mg total) by mouth every three (3) days as needed. May repeat in 3 days if symptoms not resolved 2 tablet Keymarion Bearman E, PA-C      PDMP not reviewed this encounter.   Jakelin Taussig, Pearla Bottom, PA-C 01/01/24 1407

## 2024-01-01 NOTE — Discharge Instructions (Signed)
 Based on your symptoms and results of the urinalysis I believe you have a UTI We have sent a urine sample off for a urine culture to determine the bacteria causing your UTI as well as the most appropriate medication for it.  We will keep you updated on those results as well as the results of your cervicovaginal swab once they are available. I recommend the following:  I have sent in a script for Macrobid (nitrofurantoin) to be taken by mouth twice per day for 5 days.  Please complete the entire course of medication unless a provider tells you to stop or if you develop an allergic reaction. Please finish the entire course of the antibiotic even if you are feeling better before it is completed. Stay well hydrated (at least 75 oz of water per day) and avoid holding your urine I have also sent in a medication called Diflucan  to assist with antibiotic associated yeast infection.  Please take this at the first sign of symptoms and then save the second pill for when you are finished with your antibiotic to help prevent potential complications. If you have any of the following please let us  know: persistent symptoms, fever, trouble urinating or inability to urinate, confusion, flank pain.

## 2024-01-01 NOTE — ED Triage Notes (Addendum)
 Pt presents with complaints of urinary frequency x 3 days. Pt states she is having sharp pains in her right lower pelvic area, radiates to posterior back. Pt currently rates her overall pain a 3/10. OTC Tylenol  taken this AM at 1030. Pt states the pain in her back started yesterday, it will come and go. Pelvic pain is constant.

## 2024-01-03 ENCOUNTER — Telehealth: Payer: Self-pay

## 2024-01-03 ENCOUNTER — Ambulatory Visit
Admission: EM | Admit: 2024-01-03 | Discharge: 2024-01-03 | Disposition: A | Discharge status: Home / Self Care | Attending: Internal Medicine | Admitting: Internal Medicine

## 2024-01-03 DIAGNOSIS — Z113 Encounter for screening for infections with a predominantly sexual mode of transmission: Secondary | ICD-10-CM | POA: Insufficient documentation

## 2024-01-03 LAB — URINE CULTURE: Culture: 60000 — AB

## 2024-01-03 NOTE — Telephone Encounter (Signed)
 [facility] cytology called requesting for patient to recollect cyto swab.   Patient notified and verbalized understanding.

## 2024-01-03 NOTE — ED Triage Notes (Signed)
 Pt seen on 01/01/24. Presents today to redo cytology swab due to previous swab not having patient label.

## 2024-01-04 LAB — CERVICOVAGINAL ANCILLARY ONLY
Bacterial Vaginitis (gardnerella): POSITIVE — AB
Candida Glabrata: NEGATIVE
Candida Vaginitis: NEGATIVE
Chlamydia: NEGATIVE
Comment: NEGATIVE
Comment: NEGATIVE
Comment: NEGATIVE
Comment: NEGATIVE
Comment: NEGATIVE
Comment: NORMAL
Neisseria Gonorrhea: NEGATIVE
Trichomonas: NEGATIVE

## 2024-06-26 ENCOUNTER — Ambulatory Visit
Admission: EM | Admit: 2024-06-26 | Discharge: 2024-06-26 | Disposition: A | Discharge status: Home / Self Care | Attending: Emergency Medicine | Admitting: Emergency Medicine

## 2024-06-26 ENCOUNTER — Other Ambulatory Visit: Payer: Self-pay

## 2024-06-26 DIAGNOSIS — T24211A Burn of second degree of right thigh, initial encounter: Secondary | ICD-10-CM

## 2024-06-26 MED ORDER — TETANUS-DIPHTH-ACELL PERTUSSIS 5-2-15.5 LF-MCG/0.5 IM SUSP: 0.5000 mL | Freq: Once | INTRAMUSCULAR | Status: DC

## 2024-06-26 MED ORDER — SILVER SULFADIAZINE 1 % EX CREA: 1.0000 | TOPICAL_CREAM | Freq: Two times a day (BID) | CUTANEOUS | 0 refills | Status: AC

## 2024-06-26 MED ORDER — SILVER SULFADIAZINE 1 % EX CREA
TOPICAL_CREAM | Freq: Once | CUTANEOUS | Status: AC
  Administered 2024-06-26: 1 via TOPICAL

## 2024-06-26 NOTE — Discharge Instructions (Addendum)
 Silvadene cream applied twice daily Keep area clean - wash with mild soap and water Monitor for any signs of infection and please return if needed  I recommend ibuprofen  and/or tylenol  for pain control
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
## 2024-07-12 ENCOUNTER — Telehealth: Admitting: Physician Assistant

## 2024-07-12 DIAGNOSIS — N926 Irregular menstruation, unspecified: Secondary | ICD-10-CM

## 2024-07-12 NOTE — Progress Notes (Signed)
 Virtual Visit Consent   Stacy Warner, you are scheduled for a virtual visit with a [facility] provider today. Just as with appointments in the office, your consent must be obtained to participate. Your consent will be active for this visit and any virtual visit you may have with one of our providers in the next 365 days. If you have a MyChart account, a copy of this consent can be sent to you electronically.  As this is a virtual visit, video technology does not allow for your provider to perform a traditional examination. This may limit your provider's ability to fully assess your condition. If your provider identifies any concerns that need to be evaluated in person or the need to arrange testing (such as labs, EKG, etc.), we will make arrangements to do so. Although advances in technology are sophisticated, we cannot ensure that it will always work on either your end or our end. If the connection with a video visit is poor, the visit may have to be switched to a telephone visit. With either a video or telephone visit, we are not always able to ensure that we have a secure connection.  By engaging in this virtual visit, you consent to the provision of healthcare and authorize for your insurance to be billed (if applicable) for the services provided during this visit. Depending on your insurance coverage, you may receive a charge related to this service.  I need to obtain your verbal consent now. Are you willing to proceed with your visit today? Stacy Warner has provided verbal consent on 07/12/2024 for a virtual visit (video or telephone). Stacy CHRISTELLA Dickinson, PA-C  Date: 07/12/2024 6:13 PM   Virtual Visit via Video Note   I, Stacy Warner, connected with  Stacy Warner  ([id number], 08-09-1996) on 07/12/24 at  5:45 PM EST by a video-enabled telemedicine application and verified that I am speaking with the correct person using two identifiers.  Location: Patient: Virtual Visit  Location Patient: Home Provider: Virtual Visit Location Provider: Home Office   I discussed the limitations of evaluation and management by telemedicine and the availability of in person appointments. The patient expressed understanding and agreed to proceed.    History of Present Illness: Stacy Warner is a 28 y.o. who identifies as a female who was assigned female at birth, and is being seen today for abnormal menstrual bleeding.  LMP started on 07/03/24 and lasted until 07/10/24, then restarted today.   Reports it is very light pink and only spotting. Placed a panty liner and nothing is on the liner.   Has appt with GYN on 07/25/24.  Is sexually active with no current preventative measures.    Problems:  Patient Active Problem List   Diagnosis Date Noted   Severe pre-eclampsia 04/23/2020   Indication for care in labor or delivery 04/23/2020   Gestational diabetes mellitus (GDM) in third trimester 03/05/2020   Pyelonephritis affecting pregnancy 01/14/2020   Asthma    Tobacco abuse 04/23/2016   Marijuana abuse 04/23/2016   Iron  deficiency anemia 04/18/2016    Class: Acute    Allergies: No Known Allergies Medications:  Current Outpatient Medications:    cefdinir  (OMNICEF ) 300 MG capsule, Take one cap PO Q12hr, Disp: 10 capsule, Rfl: 0   fluconazole  (DIFLUCAN ) 150 MG tablet, Take 1 tablet (150 mg total) by mouth every three (3) days as needed. May repeat in 3 days if symptoms not resolved, Disp: 2 tablet, Rfl: 0   predniSONE  (  DELTASONE ) 20 MG tablet, Take one tab by mouth twice daily for 3 days, then one daily. Take with food., Disp: 9 tablet, Rfl: 0   silver sulfADIAZINE (SILVADENE) 1 % cream, Apply 1 Application topically 2 (two) times daily., Disp: 50 g, Rfl: 0  Observations/Objective: Patient is well-developed, well-nourished in no acute distress.  Resting comfortably at home.  Head is normocephalic, atraumatic.  No labored breathing.  Speech is clear and coherent with  logical content.  Patient is alert and oriented at baseline.    Assessment and Plan: 1. Abnormal bleeding in menstrual cycle (Primary)  - Discussed sources of abnormal menstrual cycles including: residual uterine lining that was not previously expelled, ovarian cyst, uterine fibroids, vaginal or cervical polyp that could be bleeding if recently irritated, and implantation bleeding - At this time the bleeding is very light and not painful - Will monitor - Seek in person evaluation if bleeding increases - Can take an at home pregnancy if desired - Keep scheduled follow up with OB/GYN on 07/25/24  Follow Up Instructions: I discussed the assessment and treatment plan with the patient. The patient was provided an opportunity to ask questions and all were answered. The patient agreed with the plan and demonstrated an understanding of the instructions.  A copy of instructions were sent to the patient via MyChart unless otherwise noted below.    The patient was advised to call back or seek an in-person evaluation if the symptoms worsen or if the condition fails to improve as anticipated.    Stacy CHRISTELLA Dickinson, PA-C

## 2024-07-12 NOTE — Patient Instructions (Signed)
 Stacy Warner, thank you for joining Delon CHRISTELLA Dickinson, PA-C for today's virtual visit.  While this provider is not your primary care provider (PCP), if your PCP is located in our provider database this encounter information will be shared with them immediately following your visit.   A [facility] MyChart account gives you access to today's visit and all your visits, tests, and labs performed at [facility]  click here if you don't have a [facility] MyChart account or go to [email or web address]  Consent: (Patient) Stacy Warner provided verbal consent for this virtual visit at the beginning of the encounter.  Current Medications:  Current Outpatient Medications:    cefdinir  (OMNICEF ) 300 MG capsule, Take one cap PO Q12hr, Disp: 10 capsule, Rfl: 0   fluconazole  (DIFLUCAN ) 150 MG tablet, Take 1 tablet (150 mg total) by mouth every three (3) days as needed. May repeat in 3 days if symptoms not resolved, Disp: 2 tablet, Rfl: 0   predniSONE  (DELTASONE ) 20 MG tablet, Take one tab by mouth twice daily for 3 days, then one daily. Take with food., Disp: 9 tablet, Rfl: 0   silver sulfADIAZINE (SILVADENE) 1 % cream, Apply 1 Application topically 2 (two) times daily., Disp: 50 g, Rfl: 0   Medications ordered in this encounter:  No orders of the defined types were placed in this encounter.    *If you need refills on other medications prior to your next appointment, please contact your pharmacy*  Follow-Up: Call back or seek an in-person evaluation if the symptoms worsen or if the condition fails to improve as anticipated.  [facility] [phone]  Other Instructions  Abnormal Uterine Bleeding  Abnormal uterine bleeding is unusual bleeding from the uterus. It includes bleeding after sex, or bleeding or spotting between menstrual periods. It may also include bleeding that is heavier than normal, menstrual periods that last longer than usual, or bleeding that  occurs after menopause. Abnormal uterine bleeding can affect teenagers, women in their reproductive years, pregnant women, and women who have reached menopause. Common causes of abnormal uterine bleeding include: Pregnancy. Abnormal growths within the lining of the uterus (polyps). Benign tumors or growths in the uterus (fibroids). These are not cancer. Infection. Cancer. Too much or too little of some hormones in the body (hormonal imbalances). Any type of abnormal bleeding should be checked by a health care provider. Many cases are minor and simple to treat, but others may be more serious. Treatment will depend on the cause of the bleeding and how severe it is. Follow these instructions at home: Medicines Take over-the-counter and prescription medicines only as told by your health care provider. Ask your health care provider about: Taking medicines such as aspirin and ibuprofen . These medicines can thin your blood. Do not take these medicines unless your health care provider tells you to take them. Taking over-the-counter medicines, vitamins, herbs, and supplements. If you were prescribed iron  pills, take them as told by your health care provider. Iron  pills help to replace iron  that your body loses because of this condition. Managing constipation In cases of severe bleeding, you may be asked to increase your iron  intake to treat anemia. Doing this may cause constipation. To prevent or treat constipation, you may need to: Drink enough fluid to keep your urine pale yellow. Take over-the-counter or prescription medicines. Eat foods that are high in fiber, such as beans, whole grains, and fresh fruits and vegetables. Limit foods that are high in fat and  processed sugars, such as fried or sweet foods. Activity Alter your activity to decrease bleeding if you need to change your sanitary pad more than one time every 2 hours: Lie in bed with your feet raised (elevated). Place a cold pack on your  lower abdomen. Rest as much as possible until the bleeding stops or slows down. General instructions Do not use tampons, douche, or have sex until your health care provider says these things are okay. Change your sanitary pads often. Get regular exams. These include pelvic exams and cervical cancer screenings. It is up to you to get the results of any tests that are done. Ask your health care provider, or the department that is doing the tests, when your results will be ready. Monitor your condition for any changes. For 2 months, write down: When your menstrual period starts. When your menstrual period ends. When any abnormal vaginal bleeding occurs. What problems you notice. Keep all follow-up visits. This is important. Contact a health care provider if: You have bleeding that lasts for more than one week. You feel dizzy at times. You feel nauseous or you vomit. You feel light-headed or weak. You notice any other changes that show that your condition is getting worse. Get help right away if: You faint. You have bleeding that soaks through a sanitary pad every hour. You have pain in the abdomen. You have a fever or chills. You become sweaty or weak. You pass large blood clots from your vagina. These symptoms may represent a serious problem that is an emergency. Do not wait to see if the symptoms will go away. Get medical help right away. Call your local emergency services (911 in the [place] Do not drive yourself to the hospital. Summary Abnormal uterine bleeding is unusual bleeding from the uterus. Any type of abnormal bleeding should be checked by a health care provider. Many cases are minor and simple to treat, but others may be more serious. Treatment will depend on the cause of the bleeding and how severe it is. Get help right away if you faint, you have bleeding that soaks through a sanitary pad every hour, or you pass large blood clots from your vagina. This information is not  intended to replace advice given to you by your health care provider. Make sure you discuss any questions you have with your health care provider. Document Revised: 03/22/2023 Document Reviewed: 12/17/2020 Elsevier Patient Education  2024 Elsevier Inc.   If you have been instructed to have an in-person evaluation today at a local Urgent Care facility, please use the link below. It will take you to a list of all of our available [facility], including address, phone number and hours of operation. Please do not delay care.  [facility]  If you or a family member do not have a primary care provider, use the link below to schedule a visit and establish care. When you choose a [facility] primary care physician or advanced practice provider, you gain a long-term partner in health. Find a Primary Care Provider  Learn more about [facility]'s in-office and virtual care options: [facility] - Get Care Now

## 2024-07-25 ENCOUNTER — Ambulatory Visit (INDEPENDENT_AMBULATORY_CARE_PROVIDER_SITE_OTHER): Admitting: Obstetrics and Gynecology

## 2024-07-25 ENCOUNTER — Encounter: Payer: Self-pay | Admitting: Obstetrics and Gynecology

## 2024-07-25 ENCOUNTER — Telehealth: Payer: Self-pay

## 2024-07-25 VITALS — BP 134/95 | HR 87 | Ht 64.0 in | Wt 137.0 lb

## 2024-07-25 DIAGNOSIS — Z3202 Encounter for pregnancy test, result negative: Secondary | ICD-10-CM

## 2024-07-25 DIAGNOSIS — Z8742 Personal history of other diseases of the female genital tract: Secondary | ICD-10-CM

## 2024-07-25 DIAGNOSIS — N939 Abnormal uterine and vaginal bleeding, unspecified: Secondary | ICD-10-CM | POA: Diagnosis not present

## 2024-07-25 LAB — POCT URINE PREGNANCY: Preg Test, Ur: NEGATIVE

## 2024-07-25 MED ORDER — NORGESTIMATE-ETH ESTRADIOL 0.25-35 MG-MCG PO TABS: 1.0000 | ORAL_TABLET | Freq: Every day | ORAL | 1 refills | Status: AC

## 2024-07-25 MED ORDER — ONDANSETRON 4 MG PO TBDP: 4.0000 mg | ORAL_TABLET | Freq: Three times a day (TID) | ORAL | 0 refills | Status: AC | PRN

## 2024-07-25 MED ORDER — LORAZEPAM 1 MG PO TABS: 1.0000 mg | ORAL_TABLET | Freq: Once | ORAL | 0 refills | Status: AC

## 2024-07-25 MED ORDER — TRAMADOL HCL 50 MG PO TABS
50.0000 mg | ORAL_TABLET | Freq: Four times a day (QID) | ORAL | 0 refills | Status: AC | PRN
Start: 2024-07-25 — End: ?

## 2024-07-25 NOTE — Progress Notes (Signed)
 GYNECOLOGY CARE ENCOUNTER NOTE  History:     Stacy Warner is a 28 y.o. [phone] female here for Concerns regarding heavy menstrual cycle. This started after her last child was born in 2021. She reports normal, monthly menstrual cycles. She reports very heavy bleeding, including at times bleeding through a tampon and pad. Is not taking anything for [facility], very interested in an IUD.   Reports taking Aleve  during her cycle with some relief. Reports nausea and vomiting with every period every month. Has missed work due to the pain. Mom with uterine fibroids, has never been told she personally has them.   Gynecologic History Patient's last menstrual period was 07/03/2024 (exact date). Contraception: none Last Pap: 2021. Result was normal with negative HPV  Obstetric History OB History  Gravida Para Term Preterm AB Living  2 1  1 1 1   SAB IAB Ectopic Multiple Live Births   1  0 1    # Outcome Date GA Lbr Len/2nd Weight Sex Type Anes PTL Lv  2 Preterm 04/24/20 [redacted]w[redacted]d 06:22 / 00:19 4 lb 5.2 oz (1.961 kg) F Vag-Spont EPI  LIV  1 IAB 01/29/19            Past Medical History:  Diagnosis Date   Anemia    Asthma    Chlamydia    age 28   Infection    UTI    Past Surgical History:  Procedure Laterality Date   TONSILLECTOMY      Current Outpatient Medications on File Prior to Visit  Medication Sig Dispense Refill   cefdinir  (OMNICEF ) 300 MG capsule Take one cap PO Q12hr 10 capsule 0   fluconazole  (DIFLUCAN ) 150 MG tablet Take 1 tablet (150 mg total) by mouth every three (3) days as needed. May repeat in 3 days if symptoms not resolved 2 tablet 0   predniSONE  (DELTASONE ) 20 MG tablet Take one tab by mouth twice daily for 3 days, then one daily. Take with food. 9 tablet 0   silver  sulfADIAZINE  (SILVADENE ) 1 % cream Apply 1 Application topically 2 (two) times daily. (Patient not taking: Reported on 07/25/2024) 50 g 0   No current facility-administered medications on file prior to  visit.    No Known Allergies  Social History:  reports that she has quit smoking. Her smoking use included cigars and cigarettes. She has a 0.3 pack-year smoking history. She has never used smokeless tobacco. She reports current alcohol use. She reports that she does not currently use drugs after having used the following drugs: Marijuana.  Family History  Problem Relation Age of Onset   Thyroid disease Paternal Grandfather    Heart disease Paternal Grandmother    Stroke Paternal Grandmother    Healthy Father    Fibroids Mother     The following portions of the patient's history were reviewed and updated as appropriate: allergies, current medications, past family history, past medical history, past social history, past surgical history and problem list.  Review of Systems Pertinent items noted in HPI and remainder of comprehensive ROS otherwise negative.  Physical Exam:  BP (!) 134/95   Pulse 87   Ht 5' 4 (1.626 m)   Wt 137 lb (62.1 kg)   LMP 07/03/2024 (Exact Date)   BMI 23.52 kg/m  CONSTITUTIONAL: Well-developed, well-nourished female in no acute distress.  HENT:  Normocephalic, atraumatic, External right and left ear normal.  EYES: Conjunctivae and EOM are normal. NECK: Normal range of motion, supple, no masses.  MUSCULOSKELETAL: Normal range of motion. ms with respiration noted. ABDOMEN: Soft, no distention noted.  No tenderness, rebound or guarding.  PELVIC: Normal appearing external genitalia and urethral meatus,  Normal uterine size, no other palpable masses, no uterine or adnexal tenderness.  Performed in the presence of a chaperone.   Assessment and Plan:   1. Abnormal uterine bleeding (AUB) (Primary)  Interested in Mirena IUD insertion. Unable to place today due to being on her lunch break. Would like to come back. Requests anxiety meds prior to insertion. Rx: Ativan - she will need a driver  Will given 1 month of BC pills, double dose when period starts around  12/3. Rx: Tramadol  for severe pain and Zofran . Continue Aleve , take this 1-2 days prior to menstrual cycle.  If no relief from IUD insertion, would consider pelvic US .   2. History of heavy periods    Stacy Warner, Delon FERNS, NP [facility] [employer], [facility]

## 2024-07-25 NOTE — Addendum Note (Signed)
 Addended by: ELODIE NEST T on: 07/25/2024 02:47 PM   Modules accepted: Orders

## 2024-07-25 NOTE — Telephone Encounter (Signed)
 RN received message from front office to return call from pharmacy. RN spoke with Stacey/[facility]. Harlene asked if 4 prescriptions were sent by [facility]. RN confirmed prescriptions sent electronically by provider.  Silvano LELON Piano, RN

## 2024-08-15 ENCOUNTER — Ambulatory Visit: Admitting: Obstetrics and Gynecology

## 2024-08-15 ENCOUNTER — Encounter: Payer: Self-pay | Admitting: Obstetrics and Gynecology

## 2024-08-15 VITALS — BP 122/87 | HR 98 | Ht 64.0 in | Wt 138.0 lb

## 2024-08-15 DIAGNOSIS — Z3202 Encounter for pregnancy test, result negative: Secondary | ICD-10-CM

## 2024-08-15 DIAGNOSIS — Z30430 Encounter for insertion of intrauterine contraceptive device: Secondary | ICD-10-CM

## 2024-08-15 LAB — POCT URINE PREGNANCY: Preg Test, Ur: NEGATIVE

## 2024-08-15 MED ORDER — LEVONORGESTREL 20 MCG/DAY IU IUD
1.0000 | INTRAUTERINE_SYSTEM | Freq: Once | INTRAUTERINE | Status: AC
  Administered 2024-08-15: 15:00:00 1 via INTRAUTERINE

## 2024-08-15 NOTE — Progress Notes (Signed)
° °  GYNECOLOGY CLINIC PROCEDURE NOTE  Stacy Warner is a 28 y.o. [phone] here for Mirena  IUD insertion. No GYN concerns.  Last pap smear was on 2021 and was normal. Due 10/2024  IUD Insertion Procedure Note Patient identified, informed consent performed, consent signed.   Discussed risks of irregular bleeding, cramping, infection, malpositioning or misplacement of the IUD outside the uterus which may require further procedure such as laparoscopy. Time out was performed.  Urine pregnancy test negative.  Speculum placed in the vagina.  Cervix visualized.  Cleaned with Betadine x 2.  Grasped anteriorly with a single tooth tenaculum.  Uterus sounded to 7 cm.  Mirena  IUD placed per manufacturer's recommendations.  Strings trimmed to 3 cm. Tenaculum was removed, good hemostasis noted.  Patient tolerated procedure well.   Patient was given post-procedure instructions.  She was advised to have backup contraception for one week.  Patient was also asked to check IUD strings periodically and follow up as needed.  Dorita Nest I, NP 08/15/2024 3:12 PM
